# Patient Record
Sex: Male | Born: 1935 | Race: Black or African American | Hispanic: No | Marital: Single | Smoking: Never smoker
Health system: Southern US, Community
[De-identification: ages and names within clinical notes are randomized; demographics above are authoritative.]

## PROBLEM LIST (undated history)

## (undated) DIAGNOSIS — E039 Hypothyroidism, unspecified: Secondary | ICD-10-CM

## (undated) DIAGNOSIS — Z8673 Personal history of transient ischemic attack (TIA), and cerebral infarction without residual deficits: Secondary | ICD-10-CM

## (undated) DIAGNOSIS — N390 Urinary tract infection, site not specified: Secondary | ICD-10-CM

## (undated) DIAGNOSIS — G9341 Metabolic encephalopathy: Secondary | ICD-10-CM

## (undated) DIAGNOSIS — K219 Gastro-esophageal reflux disease without esophagitis: Secondary | ICD-10-CM

## (undated) DIAGNOSIS — I10 Essential (primary) hypertension: Secondary | ICD-10-CM

## (undated) DIAGNOSIS — I639 Cerebral infarction, unspecified: Secondary | ICD-10-CM

## (undated) DIAGNOSIS — F0390 Unspecified dementia without behavioral disturbance: Secondary | ICD-10-CM

## (undated) DIAGNOSIS — M1990 Unspecified osteoarthritis, unspecified site: Secondary | ICD-10-CM

## (undated) HISTORY — PX: CATARACT EXTRACTION: SUR2

## (undated) HISTORY — DX: Personal history of transient ischemic attack (TIA), and cerebral infarction without residual deficits: Z86.73

## (undated) HISTORY — DX: Metabolic encephalopathy: G93.41

---

## 1998-02-08 ENCOUNTER — Ambulatory Visit (HOSPITAL_COMMUNITY): Admission: RE | Admit: 1998-02-08 | Discharge: 1998-02-08 | Payer: Self-pay | Admitting: Orthopedic Surgery

## 1999-04-13 ENCOUNTER — Encounter: Payer: Self-pay | Admitting: Pulmonary Disease

## 1999-04-13 ENCOUNTER — Ambulatory Visit (HOSPITAL_COMMUNITY): Admission: RE | Admit: 1999-04-13 | Discharge: 1999-04-13 | Payer: Self-pay | Admitting: Pulmonary Disease

## 2000-07-14 ENCOUNTER — Ambulatory Visit (HOSPITAL_COMMUNITY): Admission: RE | Admit: 2000-07-14 | Discharge: 2000-07-14 | Payer: Self-pay | Admitting: Orthopedic Surgery

## 2002-12-18 ENCOUNTER — Encounter: Payer: Self-pay | Admitting: Emergency Medicine

## 2002-12-18 ENCOUNTER — Inpatient Hospital Stay (HOSPITAL_COMMUNITY): Admission: EM | Admit: 2002-12-18 | Discharge: 2002-12-27 | Payer: Self-pay | Admitting: *Deleted

## 2002-12-21 ENCOUNTER — Encounter: Payer: Self-pay | Admitting: Pulmonary Disease

## 2002-12-24 ENCOUNTER — Encounter: Payer: Self-pay | Admitting: Neurology

## 2002-12-25 ENCOUNTER — Encounter: Payer: Self-pay | Admitting: Pulmonary Disease

## 2002-12-27 ENCOUNTER — Encounter: Payer: Self-pay | Admitting: Cardiology

## 2003-01-12 ENCOUNTER — Encounter: Admission: RE | Admit: 2003-01-12 | Discharge: 2003-01-12 | Payer: Self-pay | Admitting: Pulmonary Disease

## 2003-01-12 ENCOUNTER — Encounter: Payer: Self-pay | Admitting: Radiology

## 2003-01-12 ENCOUNTER — Encounter: Payer: Self-pay | Admitting: Pulmonary Disease

## 2003-01-16 ENCOUNTER — Encounter: Admission: RE | Admit: 2003-01-16 | Discharge: 2003-03-15 | Payer: Self-pay | Admitting: Pulmonary Disease

## 2003-01-25 ENCOUNTER — Encounter: Payer: Self-pay | Admitting: Pulmonary Disease

## 2003-01-25 ENCOUNTER — Encounter: Admission: RE | Admit: 2003-01-25 | Discharge: 2003-01-25 | Payer: Self-pay | Admitting: Pulmonary Disease

## 2003-02-08 ENCOUNTER — Encounter: Admission: RE | Admit: 2003-02-08 | Discharge: 2003-02-08 | Payer: Self-pay | Admitting: Pulmonary Disease

## 2003-02-08 ENCOUNTER — Encounter: Payer: Self-pay | Admitting: Pulmonary Disease

## 2003-07-21 ENCOUNTER — Emergency Department (HOSPITAL_COMMUNITY): Admission: EM | Admit: 2003-07-21 | Discharge: 2003-07-21 | Payer: Self-pay | Admitting: Emergency Medicine

## 2003-07-24 ENCOUNTER — Inpatient Hospital Stay (HOSPITAL_COMMUNITY): Admission: EM | Admit: 2003-07-24 | Discharge: 2003-07-26 | Payer: Self-pay | Admitting: Emergency Medicine

## 2003-08-08 ENCOUNTER — Encounter: Admission: RE | Admit: 2003-08-08 | Discharge: 2003-08-08 | Payer: Self-pay | Admitting: Internal Medicine

## 2003-10-31 ENCOUNTER — Encounter: Admission: RE | Admit: 2003-10-31 | Discharge: 2003-10-31 | Payer: Self-pay | Admitting: Internal Medicine

## 2004-03-31 ENCOUNTER — Encounter: Admission: RE | Admit: 2004-03-31 | Discharge: 2004-05-21 | Payer: Self-pay | Admitting: Internal Medicine

## 2004-05-09 ENCOUNTER — Ambulatory Visit (HOSPITAL_COMMUNITY): Admission: RE | Admit: 2004-05-09 | Discharge: 2004-05-09 | Payer: Self-pay | Admitting: Internal Medicine

## 2008-11-09 ENCOUNTER — Inpatient Hospital Stay (HOSPITAL_COMMUNITY): Admission: EM | Admit: 2008-11-09 | Discharge: 2008-11-19 | Payer: Self-pay | Admitting: Emergency Medicine

## 2008-11-09 ENCOUNTER — Ambulatory Visit: Payer: Self-pay | Admitting: Internal Medicine

## 2008-11-12 ENCOUNTER — Ambulatory Visit: Payer: Self-pay | Admitting: Infectious Diseases

## 2008-11-15 ENCOUNTER — Encounter: Payer: Self-pay | Admitting: Internal Medicine

## 2010-12-11 LAB — DIFFERENTIAL
Basophils Absolute: 0 10*3/uL (ref 0.0–0.1)
Basophils Absolute: 0.1 10*3/uL (ref 0.0–0.1)
Basophils Relative: 0 % (ref 0–1)
Eosinophils Absolute: 0 10*3/uL (ref 0.0–0.7)
Eosinophils Relative: 0 % (ref 0–5)
Eosinophils Relative: 1 % (ref 0–5)
Lymphocytes Relative: 15 % (ref 12–46)
Lymphs Abs: 1.5 10*3/uL (ref 0.7–4.0)
Monocytes Absolute: 1.4 10*3/uL — ABNORMAL HIGH (ref 0.1–1.0)
Neutro Abs: 7.5 10*3/uL (ref 1.7–7.7)
Neutrophils Relative %: 76 % (ref 43–77)

## 2010-12-11 LAB — CBC
HCT: 36.6 % — ABNORMAL LOW (ref 39.0–52.0)
Hemoglobin: 11.8 g/dL — ABNORMAL LOW (ref 13.0–17.0)
MCV: 91.1 fL (ref 78.0–100.0)
Platelets: 141 10*3/uL — ABNORMAL LOW (ref 150–400)
Platelets: 143 10*3/uL — ABNORMAL LOW (ref 150–400)
Platelets: 152 10*3/uL (ref 150–400)
RBC: 3.8 MIL/uL — ABNORMAL LOW (ref 4.22–5.81)
RBC: 3.86 MIL/uL — ABNORMAL LOW (ref 4.22–5.81)
RBC: 4.3 MIL/uL (ref 4.22–5.81)
RDW: 13.2 % (ref 11.5–15.5)
RDW: 13.7 % (ref 11.5–15.5)
WBC: 13.5 10*3/uL — ABNORMAL HIGH (ref 4.0–10.5)
WBC: 19.2 10*3/uL — ABNORMAL HIGH (ref 4.0–10.5)
WBC: 9.8 10*3/uL (ref 4.0–10.5)

## 2010-12-11 LAB — BASIC METABOLIC PANEL
BUN: 13 mg/dL (ref 6–23)
BUN: 14 mg/dL (ref 6–23)
CO2: 25 mEq/L (ref 19–32)
Calcium: 8.4 mg/dL (ref 8.4–10.5)
Chloride: 103 mEq/L (ref 96–112)
Creatinine, Ser: 1.3 mg/dL (ref 0.4–1.5)
GFR calc non Af Amer: 51 mL/min — ABNORMAL LOW (ref >60)
GFR calc non Af Amer: 52 mL/min — ABNORMAL LOW (ref >60)
Glucose, Bld: 144 mg/dL — ABNORMAL HIGH (ref 70–99)
Glucose, Bld: 145 mg/dL — ABNORMAL HIGH (ref 70–99)
Potassium: 3.6 mEq/L (ref 3.5–5.1)
Potassium: 3.8 mEq/L (ref 3.5–5.1)

## 2010-12-11 LAB — URINALYSIS, ROUTINE W REFLEX MICROSCOPIC
Bilirubin Urine: NEGATIVE
Glucose, UA: NEGATIVE mg/dL
Ketones, ur: NEGATIVE mg/dL
Leukocytes, UA: NEGATIVE
Nitrite: NEGATIVE
Protein, ur: 100 mg/dL — AB
Specific Gravity, Urine: 1.018 (ref 1.005–1.030)
Urobilinogen, UA: 1 mg/dL (ref 0.0–1.0)
pH: 6 (ref 5.0–8.0)

## 2010-12-11 LAB — CULTURE, BLOOD (ROUTINE X 2)
Culture: NO GROWTH
Culture: NO GROWTH

## 2010-12-11 LAB — PHOSPHORUS: Phosphorus: 1.3 mg/dL — ABNORMAL LOW (ref 2.3–4.6)

## 2010-12-11 LAB — URINE MICROSCOPIC-ADD ON

## 2010-12-11 LAB — URINE CULTURE: Colony Count: 85000

## 2010-12-11 LAB — COMPREHENSIVE METABOLIC PANEL
ALT: 18 U/L (ref 0–53)
ALT: 22 U/L (ref 0–53)
AST: 23 U/L (ref 0–37)
AST: 45 U/L — ABNORMAL HIGH (ref 0–37)
Albumin: 2.6 g/dL — ABNORMAL LOW (ref 3.5–5.2)
Albumin: 3.7 g/dL (ref 3.5–5.2)
Alkaline Phosphatase: 58 U/L (ref 39–117)
Alkaline Phosphatase: 63 U/L (ref 39–117)
CO2: 23 mEq/L (ref 19–32)
CO2: 24 mEq/L (ref 19–32)
Chloride: 103 mEq/L (ref 96–112)
Chloride: 106 mEq/L (ref 96–112)
Creatinine, Ser: 1.23 mg/dL (ref 0.4–1.5)
GFR calc Af Amer: 58 mL/min — ABNORMAL LOW (ref >60)
GFR calc Af Amer: >60 mL/min (ref >60)
GFR calc non Af Amer: 48 mL/min — ABNORMAL LOW (ref >60)
GFR calc non Af Amer: 56 mL/min — ABNORMAL LOW (ref >60)
Potassium: 3.5 mEq/L (ref 3.5–5.1)
Potassium: 3.6 mEq/L (ref 3.5–5.1)
Total Bilirubin: 0.7 mg/dL (ref 0.3–1.2)
Total Bilirubin: 0.9 mg/dL (ref 0.3–1.2)

## 2010-12-11 LAB — TSH: TSH: 1.61 u[IU]/mL (ref 0.350–4.500)

## 2010-12-11 LAB — MAGNESIUM: Magnesium: 2.8 mg/dL — ABNORMAL HIGH (ref 1.5–2.5)

## 2011-01-13 NOTE — H&P (Signed)
[name], [name]                ACCOUNT NO.:  [id number]   MEDICAL RECORD NO.:  [id number]          PATIENT TYPE:  EMS   LOCATION:  ED                           FACILITY:  [facility]   PHYSICIAN:  Peggye Pitt, M.D. DATE OF BIRTH:  [birth year]   DATE OF ADMISSION:  11/09/2008  DATE OF DISCHARGE:                              HISTORY & PHYSICAL   PRIMARY CARE PHYSICIAN:  Dr. Andi Devon.   CHIEF COMPLAINT:  Back pain after a fall.   HISTORY OF PRESENT ILLNESS:  [name] is a very pleasant 77 year old  African American man who was in his usual state of health until  Wednesday, which was 2 days prior to admission, when he fell out of bed.  He denies any loss of consciousness and says that he tripped on the rug  that is next to his bed.  Ever since then he has had progressive  increase in lower back pain and right side greater than left leg pain.  His daughters Jamesetta So and Magda Paganini became very concerned today when he  could not even get out of bed and they brought him into the emergency  department for further evaluation and management.  While here he is  found to have a temperature of 101.3.  Because of the above reasons the  hospitalist service is called to admit him for further evaluation and  management.   ALLERGIES:  He has no known drug allergies.   PAST MEDICAL HISTORY:  Significant for:  1. Hypothyroidism.  2. GERD.  3. BPH.  4. Hypertension.  5. Constipation.   HOME MEDICATIONS:  1. Avodart 0.5 mg daily.  2. Nexium 40 mg daily.  3. Synthroid 100 mcg daily.  4. Norvasc 5 mg daily.  5. Benadryl 25 mg as needed.  6. Bisacodyl 5 mg as needed.  7. Tylenol PM at bedtime as needed for insomnia.  8. Aspirin 81 mg daily.  9. Metoprolol 50 mg 1-1/2 tablets daily.  10.Docusate 100 mg as needed for constipation.   SOCIAL HISTORY:  No alcohol, tobacco or illicit drug use.  Lives at home  with his wife.  Has very good family support.  Two of his daughters are  present  during this interview here in the emergency room.   FAMILY HISTORY:  Noncontributory in this elderly gentleman.   REVIEW OF SYSTEMS:  Negative except as already mentioned in HPI.   PHYSICAL EXAMINATION:  VITAL SIGNS: Upon admission blood pressure  150/82, heart rate 93, respirations 20, O2 sat 95% on room air with a  temperature of 101.3.  GENERAL:  He is alert, awake, oriented x3, is in moderate distress  secondary to his lower back pain.  HEENT: He is normocephalic, atraumatic.  His pupils are equally reactive  to light and accommodation.  He has no scleral or conjunctival pallor or  icterus.  NECK:  Supple with no JVD, no lymphadenopathy, no bruits or goiter.  LUNGS:  Clear to auscultation bilaterally.  HEART:  Regular rate and rhythm with no murmurs, rubs or gallops.  ABDOMEN:  Distended with positive bowel sounds.  Slightly tender  to  palpation diffusely.  EXTREMITIES:  He has no edema, positive pedal pulses.  NEUROLOGIC EXAM:  Mental status is intact.  Alert and oriented x3.  His  cranial nerves II through XII are intact although he has not been able  to understand the directions to evaluate his extraocular movements.  His  muscle strength is 4/5 bilateral and symmetric, believed to be effort  dependent.  His DTRs are 2+, symmetric.  He has intact sensation to  light touch bilaterally.  His Babinski is downgoing bilaterally.  He has  intact proprioception.  He has a positive straight leg raise of the  right.  His Kernig and Brudzinski signs are negative.   LABS:  Upon admission, sodium 137, potassium 3.6, chloride 103, bicarb  23, BUN 16, creatinine 1.41 with a glucose of 128.  His AST is slightly  elevated at 45, otherwise LFTs are within normal limits.  WBCs 19.2 with  an ANC of 15.9, hemoglobin of 13.4 and platelet count of 141.  Urinalysis that shows many bacteria.  A chest x-ray that shows a right  basilar atelectasis with cardiomegaly.  A lumbar spine x-ray that shows   no acute bony changes.  A CT scan of his abdomen and pelvis that shows  degenerative joint disease of the lumbar spine with a bilateral inguinal  scrotal canal hernia with no acute complications.   ASSESSMENT AND PLAN:  1. Thoracolumbar back pain.  Given the fact that his pain was after a      fall, I do believe that we need a check an MRI just to make sure he      does not have a herniated disk.  Also, of great concern given his      leukocytosis, fever and his back pain, it is a possibility that he      could have an epidural abscess.  For this reason also check an MRI      of his lumbar spine.  After results of MRI, if these are clear, we      will proceed with physical and occupational therapy consultations.  2. Fever and leukocytosis.  At this point there is no evidence for a      pneumonia.  He may have a slight a urinary tract infection,      although the results are equivocal.  We will start him on Rocephin.      We will check blood cultures and urine cultures.  Will follow.  If      he fails to defervesce, if his white count does not drop or if he      has no improvement in his back pain, he made need a lumbar puncture      just to make sure he does not have meningitis,although at this      point in time he is not confused and does not have any neck      stiffness.  3. Possible urinary tract infection.  He has been started on IV      Rocephin.  4. For his hypothyroidism we will check a TSH and continue his home      dose of Synthroid.  5. For his benign prostatic hypertrophy we will continue him on      Avodart.  6. For his hypertension we will continue his home medications.  He has      slightly elevated systolic blood pressure.  I suspect this may be      related  to pain.  7. For his constipation we will place him on MiraLax as well as      Senokot and p.r.n. Dulcolax suppositories and      Fleet's enema.  8. For prophylaxis while the hospital place on Protonix for       gastrointestinal prophylaxis and Lovenox for deep vein thrombosis      prophylaxis.      Peggye Pitt, M.D.  Electronically Signed     EH/MEDQ  D:  11/09/2008  T:  11/09/2008  Job:  [id number]   cc:   Merlene Laughter. Renae Gloss, M.D.  Fax: [phone]

## 2011-01-13 NOTE — Discharge Summary (Signed)
Bryce Potter, Bryce Potter                ACCOUNT NO.:  [id number]   MEDICAL RECORD NO.:  [id number]          PATIENT TYPE:  INP   LOCATION:  1336                         FACILITY:  [facility]   PHYSICIAN:  Monte Fantasia, MD  DATE OF BIRTH:  [birth year]   DATE OF ADMISSION:  11/09/2008  DATE OF DISCHARGE:                               DISCHARGE SUMMARY   PRIMARY CARE PHYSICIAN:  Dr. Andi Devon.   DISCHARGE DIAGNOSES:  1. Infective endocarditis.  2. Urinary tract infection.  3. Group B streptococcal bacteremia.  4. Degenerative joint disease of the lumbar spine.  5. Hypothyroidism.  6. Benign prostatic hypertrophy.  7. Hypertension.  8. Constipation.   MEDICATIONS:  Upon discharge:  1. Avodart 0.5 mg p.o. daily.  2. Nexium 40 mg p.o. daily.  3. Synthroid 100 mcg p.o. daily.  4. Norvasc 5 mg p.o. daily.  5. Aspirin 81 mg p.o. daily.  6. Metoprolol 50 mg p.o. b.i.d.  7. Ceftriaxone 2 g IV daily for 20 more days, last dose on December 06, 2008, to complete a course of 28 days.  8. Metoprolol 50 mg p.o. b.i.d.  9. MiraLax 17 g p.o. daily.   COURSE DURING THE HOSPITAL STAY:  Bryce Potter is an 77 year old African  American male patient who was admitted on March 12 status post fall with  complaints of increasing lower back pain.  On admission, the patient was  found to have fever with leukocytosis, and blood cultures and urine  cultures were sent.  The patient was found to have UA positive, and the  urine cultures grew E coli which was sensitive to ampicillin,  cephazolin, ceftriaxone, ciprofloxacin, gentamicin, levofloxacin,  nitrofurantoin, tobramycin and Bactrim.  Also, blood cultures done on  admission grew group B Streptococcus agalactiae which was sensitive to  ceftriaxone.  The patient was started on IV antibiotics for the group B  streptococcal bacteremia.  ID evaluation was also asked for in view of  the same.  The patient underwent a TEE to rule out any vegetations.  The  TEE done on April 18 showed a small bright density on the posterior  leaflet suspicious for vegetation with mild calcification on the mitral  valve.  As per the ID recommendations, the patient would need 28 days of  IV antibiotics on ceftriaxone.  The patient at present is awaiting to  have a PICC line placed and can be discharged on IV antibiotics to be  arranged with home care.  Also, the patient had repeat blood cultures  after 48 hours which have been no growth to date.   Regarding the back pain which the patient was admitted with, the patient  had an MRI of the thoracic and lumbar spine which showed degenerative  facet disease and degenerative disk disease without any pronounced  stenosis.  The patient was given pain control medications for the  degenerative joint disease.   Urinary tract infection was treated with ceftriaxone as E coli sensitive  to ceftriaxone.   Regarding hypertension, blood pressure was controlled with hypertensive  medications, and metoprolol was increased  to 50 mg p.o. b.i.d.   RADIOLOGICAL INVESTIGATIONS:  Done during the stay in the hospital,  chest x-ray done on November 09, 2008; impression:  Bibasilar atelectasis,  cardiomegaly without pulmonary edema.  X-ray of the lumbar spine done on  November 09, 2008:  No bony pathology, degenerative joint disease.  CT scan  of the abdomen and pelvis without contrast done on November 09, 2008;  impression:  No nephrolithiasis, no hydronephrosis, bilateral renal  cysts, mild stranding of the mesentery fat surrounding the root of the  mesentery, mild congested vessels seen in this region, no abscess or  fluid collection, degenerative changes in the lumbar spine.  MRI of the  thoracic and lumbar spine - MRI of the thoracic spine - impression:  Negative evaluation for thoracic spine; no evidence of fracture,  infection or compressive pathology seen.  MRI of the lumbar spine:  No  acute findings in the lumbar spine,  degenerative facet disease and  degenerative disk disease without pronounced stenosis, mild edema within  the paraspinal musculature throughout the lumbar junction.  Could relate  to muscular strain.  Abdominal x-ray done on November 10, 2008:  Bowel-gas  pattern consistent with early mild ileus.  Abdominal x-ray done on November 11, 2008:  Gas in the colon, and small bowel does not contain any  appreciable amount of gas.   Labs done during the stay in the hospital:  Total WBC 9.8, hemoglobin  12.7, hematocrit 36.6, platelets 152.  Sodium 140, potassium 3.8,  chloride 110, bicarb 24, glucose 144, BUN 14, creatinine 1.3, total  bilirubin 0.7, alkaline phosphatase 63, AST 23, ALT 18, total protein  7.1, albumin 2.6, calcium 8.5, phosphorus 1.3 and magnesium of 2.8.  Blood cultures done on November 14, 2008, have been no growth to date, and  blood cultures done on November 09, 2008:  One of the two cultures grew  group B Streptococcus; Streptococcus agalactiae isolated; sensitive to  ampicillin, penicillin, clindamycin, erythromycin, vancomycin and  levofloxacin.  Of note, blood cultures done on November 14, 2008, have been  no growth to date.   DISPOSITION:  The patient is medically stable to be discharged.  The  patient is recommended to have home physical therapy as per the  recommendations of the physical therapy evaluation.  We will have a  repeat physical therapy evaluation in view of the same.  The patient is  planned to have a PICC line today and will plan to discharge the patient  as per the PT recommendations, either to home or short-term skilled  nursing facility for rehab.   PHYSICAL FINDINGS:  On examination, vitals:  Temperature of 99.4, pulse  of 80, respirations 18, blood pressure of 138/92.  HEENT EXAMINATION:  Neck is supple.  Pupils equal and reacting to light.  No pallor, no lymphadenopathy.  RESPIRATORY EXAMINATION:  Air entry is bilaterally equal.  No rales, no  rhonchi.   CARDIOVASCULAR EXAMINATION:  S1-S2, regular rate and rhythm.  ABDOMEN:  Is soft.  No distention.  No guarding.  No rigidity.  No  tenderness.  EXTREMITIES:  No edema of feet.      Monte Fantasia, MD  Electronically Signed    MP/MEDQ  D:  11/16/2008  T:  11/16/2008  Job:  [id number]   cc:   Merlene Laughter. Renae Gloss, M.D.  Fax: [phone]

## 2011-01-13 NOTE — Discharge Summary (Signed)
NAMEGUSTAV, Bryce Potter                ACCOUNT NO.:  [id number]   MEDICAL RECORD NO.:  [id number]          PATIENT TYPE:  INP   LOCATION:  1336                         FACILITY:  [facility]   PHYSICIAN:  Monte Fantasia, MD  DATE OF BIRTH:  Jan 14, 1919   DATE OF ADMISSION:  11/09/2008  DATE OF DISCHARGE:                               DISCHARGE SUMMARY   ADDENDUM   PRIMARY CARE PHYSICIAN:  Dr. Andi Devon.   DISCHARGE DIAGNOSES:  1. Infective endocarditis.  2. Urinary tract infection.  3. Group B Streptococcal bacteremia.  4. Degenerative joint disease of lumbar spine.  5. Hypothyroidism.  6. Benign prostatic hypertrophy.  7. Hypertension.  8. Constipation.   MEDICATIONS UPON DISCHARGE:  1. Avodart 0.5 mg p.o. daily.  2. Nexium 40 mg p.o. daily.  3. Synthroid 100 mcg p.o. daily.  4. Norvasc 5 mg p.o. daily.  5. Aspirin 81 mg p.o. daily.  6. Metoprolol 50 mg p.o. b.i.d.  7. Ceftriaxone 2 grams IV daily, the last dose on December 06, 2008,  to      complete course of 28 days.  8. Metoprolol 50 mg p.o. b.i.d.  9. MiraLax 17 grams p.o. daily.   COURSE DURING THE HOSPITAL STAY:  Please refer to the prior Discharge  Summary dictation for the course during the hospital stay dictated on  November 06, 2008. This is an addendum to the same.  There has been no  change in the course.  The patient at present is awaiting placement in  the nursing home facility for rehab for his degenerative joint disease  with lumbar pain.  The patient has had no complaints in the last 2-3  days and at present is medically stable to be discharged home.   PHYSICAL EXAMINATION:  VITALS:  Temperature of 97.8, heart rate 88,  respirations 20, blood pressure 140/75.  HEENT EXAMINATION:  Neck is supple.  Pupils equal, reacting to light.  No pallor, no lymphadenopathy.  RESPIRATORY EXAMINATION:  Air entry is bilaterally equal.  No rales, no  rhonchi.  CARDIOVASCULAR EXAMINATION: S1 and S2, regular rate and rhythm.  ABDOMEN:  Soft.  No guarding or rigidity.  No tenderness.  EXTREMITIES: No edema in the feet.  PICC line in the right arm.   DISPOSITION:  The patient at present is medically stable to be  discharged to rehab facility, will discharge the patient today.      Monte Fantasia, MD  Electronically Signed     MP/MEDQ  D:  11/19/2008  T:  11/19/2008  Job:  [id number]

## 2011-01-16 NOTE — Discharge Summary (Signed)
NAMECASEN, Bryce Potter                            ACCOUNT NO.:  [id number]   MEDICAL RECORD NO.:  [id number]                   PATIENT TYPE:  INP   LOCATION:  2041                                 FACILITY:  [facility]   PHYSICIAN:  Leonia Reeves, MD                 DATE OF BIRTH:  1919/07/17   DATE OF ADMISSION:  07/24/2003  DATE OF DISCHARGE:  07/26/2003                                 DISCHARGE SUMMARY   DISCHARGE DIAGNOSES:  1. Right lower lobe pneumonia, probably secondary to aspiration.  2. Dehydration.  3. General weakness and deconditioning.  4. Leg pain, probably secondary to degenerative joint disease.   DISCHARGE MEDICATIONS:  1. Levaquin 500 mg once daily for 10 more days.  2. Robitussin 15 ml q.6h. as needed for cough.   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  The patient presents to the emergency room with cough, sputum  production, and general weakness.  The patient had been seen earlier in the  ER a few days prior to this presentation when he came with cough, and was  diagnosed with early right lower lobe pneumonia on chest x-ray.  He was  given p.o. Levaquin and was discharged home.  The problem continued, and the  patient also developed lower extremity pains.  He denied chest pain,  shortness of breath, and also denied nausea or vomiting.   PHYSICAL EXAMINATION:  VITAL SIGNS:  Temperature 98.4, blood pressure  102/53, pulse 125, which was sinus, respiratory rate 22.  His initial  saturation was 82% on room air, which improved to 96% after oxygen  treatment.  GENERAL:  He was well-developed, well-nourished, and in no acute distress.  CHEST:  Significant for decreased breath sounds at the bases with fine  crackles, left greater than right, but no obvious rhonchi.  EXTREMITIES:   Global weakness over the left leg.  It was much weaker than  the right.  There was no edema, no deformity.  NEUROLOGIC:  Speech was clear and fluent.  Mild left facial weakness.   The rest of the physical exam was unremarkable.   LABORATORY DATA:  White count 6, hemoglobin 12.8, hematocrit 32.8, platelets  151.  CT of the head done in the previous presentation in the ER revealed no  acute event.  Chest x-ray showed no acute disease.  There was no progression  of the early right lower lobe finding on the previous x-ray.  Foot x-ray  showed no acute disease or fracture.   HOSPITAL COURSE:  The patient was admitted to the medical floor and started  on IV rehydration with normal saline, and p.o. antibiotics with Levaquin.  He was also started on his home medications, but antihypertensive was held  for low blood  pressure.  The patient also benefited from physical therapy,  in which he did well for improvement of strength.  He responded to the above  regimen and continued to do well.  General weakness improved to the  patient's baseline.   DISPOSITION:  The patient is being discharged home today in stable  condition.  He has bee instructed to follow up with his primary care  physician in 7-14 days following discharge.  He has also been instructed to  continue his home medication, and review with the primary care physician.  The rationale for this discharge was discussed with the patient and his two  daughters, who were at the bedside during this discussion, and they are  agreeable.                                                Leonia Reeves, MD    VO/MEDQ  D:  07/26/2003  T:  07/26/2003  Job:  [id number]   cc:   Olene Craven, M.D.  [address]  [address]  [place]  [place] [address]  Fax: [phone]

## 2011-01-16 NOTE — Consult Note (Signed)
Bryce Potter, Bryce Potter                            ACCOUNT NO.:  [id number]   MEDICAL RECORD NO.:  [id number]                   PATIENT TYPE:  INP   LOCATION:  3022                                 FACILITY:  [facility]   PHYSICIAN:  Marlan Palau, M.D.               DATE OF BIRTH:  12/08/1919   DATE OF CONSULTATION:  12/26/2002  DATE OF DISCHARGE:                                   CONSULTATION   HISTORY OF PRESENT ILLNESS:  The patient is an 77 year old right-handed  black male born December 08, 1919 with a history of mild hypertension.  The  patient presented to [facility] with mild right-sided  weakness, dysarthria on the day of admission.  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  The patient was noted to be slightly  staggering.  The patient denied any headache or visual field changes.  The  patient denies any numbness on one side but claims his feet had been numb  for five to six years.   The patient underwent a CT scan of the head initially that was unremarkable,  but a MRI scan of the brain showed a small left central semiovale acute  stroke.  Mild intracranial atherosclerotic changes were seen.  The patient  has remained on heparin.  Neurology has asked to see this patient for  further evaluation.  The patient claims he was not on aspirin prior to  admission.   PAST MEDICAL HISTORY:  1. Significant for history of mild right hemiparesis on this admission with     a left central semiovale stroke.  2. Hypertension.  3. Bilateral cataract surgery.  4. Left foot surgery.  5. Hypothyroidism.  6. Degenerative arthritis, low back pain.   ALLERGIES:  The patient has no known allergies.   HABITS:  Does not smoke or drink.   CURRENT MEDICATIONS:  1. Lopressor 25 mg b.i.d.  2. Zocor for hypercholesterolemia 20 mg q.h.s.  3. __________ 100 mg b.i.d.  4. Synthroid 0.1 mg  q.d.  5. The patient is on aspirin 325 mg q.d.  6. Avapro 150 mg q.d.  7. Hydrochlorothiazide 12.5 mg q.d.  8. Avalox q.d.  9. IV heparin.   SOCIAL HISTORY:  The patient lives in the [place] area.  Married.  Has  four children, he claims are alive and well.  Does some part-time work for  the [employer].   FAMILY HISTORY:  Notable that both parents have passed away from old age.  The patient has one sister-apparently three sisters have passed away, one to  cancer and one to old age.   REVIEW OF SYMPTOMS:  Notable for no recent fevers or chills.  The patient  does note some neck pains and low back pains.  Denies shortness  of breath,  chest pains, palpitations, nausea, vomiting.  Has not had any trouble  controlling the bowels or bladder.  Denies any loss of consciousness.   PHYSICAL EXAMINATION:  VITAL SIGNS:  Blood pressure is 106/57, heart rate is  109, respiratory rate 20, temperature 100.2.  GENERAL:  The patient is a fairly well-developed black male who is alert and  cooperative at the time of examination.  HEENT:  Head:  Atraumatic.  Eyes:  Pupils are equal, round, and reactive to  light.  Pupils are postsurgical.  Disk are flat bilaterally.  NECK:  Supple.  No carotid bruits noted.  LUNGS:  Clear.  CARDIOVASCULAR:  Regular rate and rhythm with no obvious murmurs or rubs  noted.  EXTREMITIES:  Without significant edema.  NEUROLOGICAL:  Cranial nerves as above.  Facial symmetry is present.  The  patient has good sensation to face to pinprick and soft touch bilaterally.  Speech was fairly well enunciated, maybe a slight amount of dysarthria is  noted.  Again, visual fields are full.  Extraocular movements are full.  The  patient has good strength on all four extremities to direct testing.  The  patient has good finger-to-nose and heel-to-shin bilaterally.  Deep tendon  reflexes are relatively symmetric throughout.  There is are upgoing toes  noted  bilaterally.  The patient again has good pinprick, soft, and vibratory  sensation throughout.  Gait is unremarkable.  The patient is able to walk  without assistance.  The patient is able to perform tandem gait with minimal  instability.  Romberg is negative.  No evidence of pronator drift was seen.   LABORATORY DATA:  Laboratory values are notable for a white count of 7.7,  hemoglobin 11.8, hematocrit of 34.1, platelets of 207,000.  INR of 1.0,  sodium is 139, potassium 3.6, chloride of 107, CO2 26, glucose of 108, BUN  18, creatinine 1.7, calcium 8.2, total protein 6.6, albumin 3.4, AST of 19,  ALT 29, ALP of 53, total bilirubin of 0.5.  CK of 77, MB fraction of 2.2,  troponin I of 0.02.  Cholesterol reveals total cholesterol level of 200, HDL  of 73,m VLDL of 17, LDL of 110.  Urinalysis reveals specific gravity of  1.014, pH of 6.5, large amount of hemoglobin, 0 to 2 white cells, red cells  too numerous to count.   Chest x-ray reveals mild cardiac enlargement, no active disease is seen.  EKG reveals sinus tachycardia with occasional premature supraventricular  complexes, nonspecific T-wave abnormalities, heart rate of 110.   IMPRESSION:  1. New onset of mild right hemiparesis, mild dysarthria, left central     semiovale stroke.  2. Hypertension.   RECOMMENDATIONS:  This patient has very minimal symptoms at this point.  The  patient will likely do well as an outpatient.  The patient claims he is not  on aspirin prior to coming in but currently is on aspirin at this point as  well as heparin.  We will complete the full workup at this point.  The  patient has mild intracranial atherosclerotic changes performed with carotid  doppler study and 2-D echocardiogram.  If his is unremarkable, the patient  may be discharged to home on antiplatelet agents.    PLAN:  1. Carotid doppler.  2. A 2-D echocardiogram. 3. We will follow the patient's clinical course while in house.  Marlan Palau, M.D.    CKW/MEDQ  D:  12/26/2002  T:  12/26/2002  Job:  [id number]   cc:   Eino Farber., M.D.  [address] [place]  [place] [address]  Fax: [phone]   [facility]  [address]

## 2011-01-16 NOTE — Discharge Summary (Signed)
Bryce Potter, Bryce Potter                            ACCOUNT NO.:  [id number]   MEDICAL RECORD NO.:  [id number]                   PATIENT TYPE:  INP   LOCATION:  3022                                 FACILITY:  [facility]   PHYSICIAN:  Eino Farber., M.D.      DATE OF BIRTH:  12/08/1919   DATE OF ADMISSION:  12/18/2002  DATE OF DISCHARGE:  12/27/2002                                 DISCHARGE SUMMARY   DISCHARGE DIAGNOSES:  1. Acute infarction cerebrovascular accident with mild right hemiparesis,     slowly improving.  2. Hypertensive heart disease history.  3. Hypercholesterolemia, mild and being treated.  4. Hypothyroidism, being treated with Synthroid.  5. Lumbar spine disk disease with moderate to severe pain, being treated and     planned for steroid spine injections.  6. Degenerative joint disease of the extremities.  7. Bacteremia with fever, resolved with antibiotics for cocci.   BRIEF HISTORY:  The patient is an 77 year old African-American male with a  complaint of feeling well today until he was seen by his grandson, who  noticed that he was weak on the right side with some twisting of his face,  and he was brought to the [facility] emergency department  for further evaluation.  The patient has a long history of mild hypertensive  heart disease.  He also has a history of degenerative joint disease of his  extremities and back in the lumbar spine area.   PAST HISTORY:  The patient has a past history of hypertensive heart disease  as noted, and takes Diovan 160/hydrochlorothiazide 12.5 mg daily.   SOCIAL HISTORY:  The patient is married and lives with his wife.  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   PHYSICAL EXAMINATION:  Pertinent physical findings on 12/18/02 revealed the  following -  VITAL SIGNS:  Temperature 99, pulse 102, respirations 20, and blood pressure  of 160/91 to improvement to the 146/70  range.  GENERAL:  The patient is alert and talking coherently.  HEENT:  Eyes - pupils equal, round and reactive to light.  The patient does  have a mild right facial weakness with minimal slurred speech.  NECK:  Supple with no bruits.  HEART:  Regular rhythm with no rubs.  ABDOMEN:  Moderately obese and moderately distended, but nontender.  The  patient relates not having a bowel movement on the day of admission, which  was an unusual occurrence for him.  EXTREMITIES:  There is minimal weakness of the right leg and no edema.   LABORATORY DATA:  CBC on 12/18/02 revealed a hemoglobin of 14.5, hematocrit  43.6, platelet count 165,000.  The patient's CBC remained unusual and  satisfactory throughout the hospital course.  Laboratory data revealed no  significant change.  There were some occasions of elevated serum potassium  in a hemolyzed specimen in the 6 mEq per liter  range, but repeat in a non-  hemolyzed specimen was 3.7 mEq per liter.  The patient's thyroid panel  revealed that he was getting satisfactory control with his Synthroid.  His  serum cholesterol was also in the borderline range to slightly elevated for  his vascular occlusion problem with a cholesterol of 191 on 12/21/02, with an  LDL cholesterol of 110, with the goal being in the 85 range.  The patient's  CT scan did reveal changes of small ___________ disease, and MRI revealed a  small left central semi ovale acute stroke during his hospital course.  The  patient's 2-D echo did not reveal a wall thrombus.  There was mild  heterogenous plaque origins in the internal carotid artery.  The patient's 2-  D echo revealed a good systolic ejection fraction ranging being in the 65%  range.  No cardiac embolus source was found.  The patient continued to  improve and was discharged on 12/27/02.   DISCHARGE MEDICATIONS:  1. Pletal 100 mg tablet, one tablet q.i.d.  2. Lopressor 25 mg tablet, one tablet twice a day.  3. Diovan HCT  80/12.5 mg tabs one daily.  4. Zocor 20 mg one tablet at bedtime.  5. Synthroid 100 mcg. one tablet daily.  6. Aspirin 325 mg one tablet daily.  The patient has not been taking his     aspirin every day prior to admission.  7. He is to have Tylenol 650 mg q.4h. p.r.n. as needed for joint pains.   DIET:  Low-salt, low-fat, low-cholesterol diet.   [facility] will assist him with outpatient physical therapy and  outpatient therapy.  He is to have follow up in my office, Corine Shelter, M.D., on 01/24/03.                                               Eino Farber., M.D.    GRK/MEDQ  D:  02/01/2003  T:  02/01/2003  Job:  [id number]   cc:   Marlan Palau, M.D.  [address]  [address]  [place]  [place] [address]  Fax: [phone]

## 2011-01-16 NOTE — H&P (Signed)
Bryce Potter, DRUMMOND                            ACCOUNT NO.:  [id number]   MEDICAL RECORD NO.:  [id number]                   PATIENT TYPE:  INP   LOCATION:                                       FACILITY:  [facility]   PHYSICIAN:  Melissa L. Ladona Ridgel, MD               DATE OF BIRTH:  1919-07-26   DATE OF ADMISSION:  07/24/2003  DATE OF DISCHARGE:  07/26/2003                                HISTORY & PHYSICAL   CHIEF COMPLAINT:  Left leg pain, weakness, history of right lower lobe  pneumonia being treated.   HISTORY OF PRESENT ILLNESS:  This is an 77 year old African-American male  who was seen over the weekend in the emergency room for complaints of cough  productive of sputum and generalized weakness. He was diagnosed with  dehydration and early right lobe pneumonia on x-ray. He was deemed suitable  for outpatient therapy with Levaquin and was discharged home. The patient  states that he was okay until later Saturday/early Sunday. Between now and  then, he relates a drastic change. He has been unable to eat or drink. He is  actually unable to walk today. He required assistance to the emergency room  and complained of left leg pain. He denies fevers or chills or nausea or  vomiting and states that cough has improved with therapy.   REVIEW OF SYSTEMS:  His review of systems is as above. He denies any dysuria  or frequency. Denies chest pain, shortness of breath, abdominal pain  __________ yesterday. He denies melena or hematochezia. He does complain of  generalized weakness and ambulatory dysfunction secondary to pain and  weakness.   PAST MEDICAL HISTORY:  Significant for CVA back in April 2004 with a left  central semi-ovale stroke with some residual right sided weakness.  1. Hypertension.  2. Hypothyroidism.  3. Degenerative joint disease.   PAST SURGICAL HISTORY:  Significant for cataracts bilaterally and some left  foot surgery.   ALLERGIES:  No known drug allergies.   MEDICATIONS:  As per his old record his last admission showed:  1. Lopressor 25 mg b.i.d.  2. Zocor 20 mg q.h.s.  3. Synthroid 100 mcg q.d.  4. Aspirin 325 q.d.  5. Diovan hydrochlorothiazide 80 and 125 daily.  6. Pletal 100 mg q.i.d.  7. He is also taking Levaquin as per the emergency room physician since     discharged on Saturday.   The patient is unable to give me his medication list. His daughter is not  available, and his son does not know his medications. Call has been placed  to Dr. Loney Laurence office to view his medical chart.   PHYSICAL EXAMINATION:  VITAL SIGNS:  On admission 98.4, blood pressure  102/53, pulse 125 which is sinus, respiratory rate 22. Initially his  saturation is documented as 82% on room air. He is currently 96% on room  air. Complains of  pain 5/10 of his left lower foot/leg.  GENERAL:  This is a weak, African-American male, well-developed, well-  nourished in no acute distress.  HEENT:  Pupils are equal, round, and reactive to light. Extraocular muscles  are intact. His mucous membranes are dry. He does have a coating on his  tongue.  NECK:  He has no lymph nodes, no bruits. He has no thyromegaly. No JVD. He  does have mild left __________.  CHEST:  Decreased at the bases with fine crackles, left greater than right,  but no obvious rhonchi.  CARDIOVASCULAR:  Tachycardia. Positive S1 and S2. No S3 or S4. No murmurs,  rubs, or gallops are noted.  ABDOMEN:  Soft, nontender, nondistended with positive bowel sounds.  EXTREMITIES:  Show global weakness over the left leg. It is much weaker than  the right. There is no edema. No deformities. The plantar area is tender.  NEUROLOGICAL:  Speech is clear and fluent. Mild left facial. There is some  global weakness. DTRs are 2+. Plantars are unequivocal. He does not appear  to have any focal deficits.   LABORATORY DATA:  At present, CBC shows a white count of 6, hemoglobin of  12.8, hematocrit of 38, platelet  count of 151. His BMET is pending. His  urinalysis is negative. No leukocyte esterase or nitrites are noted. White  count 0 to 2 on his urinalysis. Chest x-ray shows no acute disease. There is  not any progression of the early right lower lobe finding on the previous x-  ray. Foot x-ray shows no acute disease. Head CT done on Saturday reveals no  acute event.   ASSESSMENT/PLAN:  This is an 77 year old African-American male who recently  was treated for early right lower lobe pneumonia with Levaquin.  His cough  is better, but he has developed decreased appetite, weakness, and could not  walk. Also has some left foot and left leg pain, and there is no focal  neurological deficit seen on exam or complained of.   1. Pulmonary. X-ray shows no signs of pneumonia. No increased white count     and no fever on admission. We will, however, continue treatment with     Levaquin, and if his saturations show decrease or worsening, we will     consider imaging his chest for PE.  2. Cardiovascular. His hypertension is currently not an issue as he is     mildly hypotensive. We will hold his Diovan and Lopressor and call and     speak with Dr. Loney Laurence office regarding his true medication list     versus waiting on the family to bring that in.  3. Endocrine. The patient has a history of hypothyroidism. We will check a     TSH and continue his Synthroid at 100 mcg for now.  4. Neurological. He is expressing generalized weakness. There is some left     greater than right decrease in power; however, this does appear to be     subjective. If he does not improve with rehydration and improvement in     diet in terms of symptoms of weakness, we will consider checking MRI/MRA     to rule out new stroke.  5. Foot pain. X-ray shows no evidence of fracture; however, he was recently     placed on a quinolone which could induce Achilles rupture, and I will     further examine the patient along these  lines.  PLAN:  The plan is to admit  the patient to telemetry, rehydrate, and follow  up once he is adequately being fed and rehydrated for further signs and  symptoms suggestive of a neurological event or other explanation for his  generalized weakness.                                                Melissa L. Ladona Ridgel, MD    MLT/MEDQ  D:  07/24/2003  T:  07/24/2003  Job:  [id number]

## 2011-12-01 DIAGNOSIS — K219 Gastro-esophageal reflux disease without esophagitis: Secondary | ICD-10-CM | POA: Diagnosis not present

## 2011-12-01 DIAGNOSIS — N184 Chronic kidney disease, stage 4 (severe): Secondary | ICD-10-CM | POA: Diagnosis not present

## 2011-12-01 DIAGNOSIS — I129 Hypertensive chronic kidney disease with stage 1 through stage 4 chronic kidney disease, or unspecified chronic kidney disease: Secondary | ICD-10-CM | POA: Diagnosis not present

## 2011-12-01 DIAGNOSIS — H6120 Impacted cerumen, unspecified ear: Secondary | ICD-10-CM | POA: Diagnosis not present

## 2011-12-15 DIAGNOSIS — M79609 Pain in unspecified limb: Secondary | ICD-10-CM | POA: Diagnosis not present

## 2011-12-15 DIAGNOSIS — B351 Tinea unguium: Secondary | ICD-10-CM | POA: Diagnosis not present

## 2012-05-25 DIAGNOSIS — H6120 Impacted cerumen, unspecified ear: Secondary | ICD-10-CM | POA: Diagnosis not present

## 2012-05-25 DIAGNOSIS — E039 Hypothyroidism, unspecified: Secondary | ICD-10-CM | POA: Diagnosis not present

## 2012-05-25 DIAGNOSIS — I129 Hypertensive chronic kidney disease with stage 1 through stage 4 chronic kidney disease, or unspecified chronic kidney disease: Secondary | ICD-10-CM | POA: Diagnosis not present

## 2012-05-25 DIAGNOSIS — E559 Vitamin D deficiency, unspecified: Secondary | ICD-10-CM | POA: Diagnosis not present

## 2012-05-25 DIAGNOSIS — E785 Hyperlipidemia, unspecified: Secondary | ICD-10-CM | POA: Diagnosis not present

## 2012-05-25 DIAGNOSIS — I10 Essential (primary) hypertension: Secondary | ICD-10-CM | POA: Diagnosis not present

## 2012-05-25 DIAGNOSIS — N401 Enlarged prostate with lower urinary tract symptoms: Secondary | ICD-10-CM | POA: Diagnosis not present

## 2012-08-15 DIAGNOSIS — Z23 Encounter for immunization: Secondary | ICD-10-CM | POA: Diagnosis not present

## 2012-11-23 DIAGNOSIS — E559 Vitamin D deficiency, unspecified: Secondary | ICD-10-CM | POA: Diagnosis not present

## 2012-11-23 DIAGNOSIS — I129 Hypertensive chronic kidney disease with stage 1 through stage 4 chronic kidney disease, or unspecified chronic kidney disease: Secondary | ICD-10-CM | POA: Diagnosis not present

## 2012-11-23 DIAGNOSIS — R1310 Dysphagia, unspecified: Secondary | ICD-10-CM | POA: Diagnosis not present

## 2012-11-23 DIAGNOSIS — I10 Essential (primary) hypertension: Secondary | ICD-10-CM | POA: Diagnosis not present

## 2012-11-23 DIAGNOSIS — R05 Cough: Secondary | ICD-10-CM | POA: Diagnosis not present

## 2012-11-23 DIAGNOSIS — E039 Hypothyroidism, unspecified: Secondary | ICD-10-CM | POA: Diagnosis not present

## 2012-11-29 DIAGNOSIS — Z8673 Personal history of transient ischemic attack (TIA), and cerebral infarction without residual deficits: Secondary | ICD-10-CM

## 2012-11-29 HISTORY — DX: Personal history of transient ischemic attack (TIA), and cerebral infarction without residual deficits: Z86.73

## 2013-02-21 DIAGNOSIS — R05 Cough: Secondary | ICD-10-CM | POA: Diagnosis not present

## 2013-02-21 DIAGNOSIS — E039 Hypothyroidism, unspecified: Secondary | ICD-10-CM | POA: Diagnosis not present

## 2013-02-21 DIAGNOSIS — M545 Low back pain: Secondary | ICD-10-CM | POA: Diagnosis not present

## 2013-02-21 DIAGNOSIS — E785 Hyperlipidemia, unspecified: Secondary | ICD-10-CM | POA: Diagnosis not present

## 2013-02-21 DIAGNOSIS — I129 Hypertensive chronic kidney disease with stage 1 through stage 4 chronic kidney disease, or unspecified chronic kidney disease: Secondary | ICD-10-CM | POA: Diagnosis not present

## 2013-05-17 DIAGNOSIS — E559 Vitamin D deficiency, unspecified: Secondary | ICD-10-CM | POA: Diagnosis not present

## 2013-05-17 DIAGNOSIS — I129 Hypertensive chronic kidney disease with stage 1 through stage 4 chronic kidney disease, or unspecified chronic kidney disease: Secondary | ICD-10-CM | POA: Diagnosis not present

## 2013-05-17 DIAGNOSIS — E039 Hypothyroidism, unspecified: Secondary | ICD-10-CM | POA: Diagnosis not present

## 2013-05-17 DIAGNOSIS — I6789 Other cerebrovascular disease: Secondary | ICD-10-CM | POA: Diagnosis not present

## 2013-05-17 DIAGNOSIS — Z0000 Encounter for general adult medical examination without abnormal findings: Secondary | ICD-10-CM | POA: Diagnosis not present

## 2013-05-17 DIAGNOSIS — N390 Urinary tract infection, site not specified: Secondary | ICD-10-CM | POA: Diagnosis not present

## 2013-05-17 DIAGNOSIS — R7309 Other abnormal glucose: Secondary | ICD-10-CM | POA: Diagnosis not present

## 2013-05-17 DIAGNOSIS — E785 Hyperlipidemia, unspecified: Secondary | ICD-10-CM | POA: Diagnosis not present

## 2013-05-24 DIAGNOSIS — Z8673 Personal history of transient ischemic attack (TIA), and cerebral infarction without residual deficits: Secondary | ICD-10-CM | POA: Diagnosis not present

## 2013-05-24 DIAGNOSIS — M6281 Muscle weakness (generalized): Secondary | ICD-10-CM | POA: Diagnosis not present

## 2013-05-24 DIAGNOSIS — I129 Hypertensive chronic kidney disease with stage 1 through stage 4 chronic kidney disease, or unspecified chronic kidney disease: Secondary | ICD-10-CM | POA: Diagnosis not present

## 2013-05-24 DIAGNOSIS — F0280 Dementia in other diseases classified elsewhere without behavioral disturbance: Secondary | ICD-10-CM | POA: Diagnosis not present

## 2013-05-24 DIAGNOSIS — R262 Difficulty in walking, not elsewhere classified: Secondary | ICD-10-CM | POA: Diagnosis not present

## 2013-05-24 DIAGNOSIS — M1710 Unilateral primary osteoarthritis, unspecified knee: Secondary | ICD-10-CM | POA: Diagnosis not present

## 2013-05-24 DIAGNOSIS — N189 Chronic kidney disease, unspecified: Secondary | ICD-10-CM | POA: Diagnosis not present

## 2013-05-24 DIAGNOSIS — R32 Unspecified urinary incontinence: Secondary | ICD-10-CM | POA: Diagnosis not present

## 2013-05-24 DIAGNOSIS — IMO0001 Reserved for inherently not codable concepts without codable children: Secondary | ICD-10-CM | POA: Diagnosis not present

## 2013-05-26 DIAGNOSIS — R262 Difficulty in walking, not elsewhere classified: Secondary | ICD-10-CM | POA: Diagnosis not present

## 2013-05-26 DIAGNOSIS — M6281 Muscle weakness (generalized): Secondary | ICD-10-CM | POA: Diagnosis not present

## 2013-05-26 DIAGNOSIS — I129 Hypertensive chronic kidney disease with stage 1 through stage 4 chronic kidney disease, or unspecified chronic kidney disease: Secondary | ICD-10-CM | POA: Diagnosis not present

## 2013-05-26 DIAGNOSIS — IMO0001 Reserved for inherently not codable concepts without codable children: Secondary | ICD-10-CM | POA: Diagnosis not present

## 2013-05-26 DIAGNOSIS — F0280 Dementia in other diseases classified elsewhere without behavioral disturbance: Secondary | ICD-10-CM | POA: Diagnosis not present

## 2013-05-26 DIAGNOSIS — M1710 Unilateral primary osteoarthritis, unspecified knee: Secondary | ICD-10-CM | POA: Diagnosis not present

## 2013-05-30 DIAGNOSIS — M1710 Unilateral primary osteoarthritis, unspecified knee: Secondary | ICD-10-CM | POA: Diagnosis not present

## 2013-05-30 DIAGNOSIS — IMO0001 Reserved for inherently not codable concepts without codable children: Secondary | ICD-10-CM | POA: Diagnosis not present

## 2013-05-30 DIAGNOSIS — M6281 Muscle weakness (generalized): Secondary | ICD-10-CM | POA: Diagnosis not present

## 2013-05-30 DIAGNOSIS — R262 Difficulty in walking, not elsewhere classified: Secondary | ICD-10-CM | POA: Diagnosis not present

## 2013-05-30 DIAGNOSIS — I129 Hypertensive chronic kidney disease with stage 1 through stage 4 chronic kidney disease, or unspecified chronic kidney disease: Secondary | ICD-10-CM | POA: Diagnosis not present

## 2013-05-30 DIAGNOSIS — F0280 Dementia in other diseases classified elsewhere without behavioral disturbance: Secondary | ICD-10-CM | POA: Diagnosis not present

## 2013-05-31 DIAGNOSIS — IMO0001 Reserved for inherently not codable concepts without codable children: Secondary | ICD-10-CM | POA: Diagnosis not present

## 2013-05-31 DIAGNOSIS — M1710 Unilateral primary osteoarthritis, unspecified knee: Secondary | ICD-10-CM | POA: Diagnosis not present

## 2013-05-31 DIAGNOSIS — R262 Difficulty in walking, not elsewhere classified: Secondary | ICD-10-CM | POA: Diagnosis not present

## 2013-05-31 DIAGNOSIS — I129 Hypertensive chronic kidney disease with stage 1 through stage 4 chronic kidney disease, or unspecified chronic kidney disease: Secondary | ICD-10-CM | POA: Diagnosis not present

## 2013-05-31 DIAGNOSIS — F0280 Dementia in other diseases classified elsewhere without behavioral disturbance: Secondary | ICD-10-CM | POA: Diagnosis not present

## 2013-05-31 DIAGNOSIS — M6281 Muscle weakness (generalized): Secondary | ICD-10-CM | POA: Diagnosis not present

## 2013-06-01 DIAGNOSIS — M1710 Unilateral primary osteoarthritis, unspecified knee: Secondary | ICD-10-CM | POA: Diagnosis not present

## 2013-06-01 DIAGNOSIS — R262 Difficulty in walking, not elsewhere classified: Secondary | ICD-10-CM | POA: Diagnosis not present

## 2013-06-01 DIAGNOSIS — F0280 Dementia in other diseases classified elsewhere without behavioral disturbance: Secondary | ICD-10-CM | POA: Diagnosis not present

## 2013-06-01 DIAGNOSIS — I129 Hypertensive chronic kidney disease with stage 1 through stage 4 chronic kidney disease, or unspecified chronic kidney disease: Secondary | ICD-10-CM | POA: Diagnosis not present

## 2013-06-01 DIAGNOSIS — IMO0001 Reserved for inherently not codable concepts without codable children: Secondary | ICD-10-CM | POA: Diagnosis not present

## 2013-06-01 DIAGNOSIS — M6281 Muscle weakness (generalized): Secondary | ICD-10-CM | POA: Diagnosis not present

## 2013-06-05 DIAGNOSIS — R262 Difficulty in walking, not elsewhere classified: Secondary | ICD-10-CM | POA: Diagnosis not present

## 2013-06-05 DIAGNOSIS — M6281 Muscle weakness (generalized): Secondary | ICD-10-CM | POA: Diagnosis not present

## 2013-06-05 DIAGNOSIS — M1710 Unilateral primary osteoarthritis, unspecified knee: Secondary | ICD-10-CM | POA: Diagnosis not present

## 2013-06-05 DIAGNOSIS — F0280 Dementia in other diseases classified elsewhere without behavioral disturbance: Secondary | ICD-10-CM | POA: Diagnosis not present

## 2013-06-05 DIAGNOSIS — I129 Hypertensive chronic kidney disease with stage 1 through stage 4 chronic kidney disease, or unspecified chronic kidney disease: Secondary | ICD-10-CM | POA: Diagnosis not present

## 2013-06-05 DIAGNOSIS — IMO0001 Reserved for inherently not codable concepts without codable children: Secondary | ICD-10-CM | POA: Diagnosis not present

## 2013-06-06 DIAGNOSIS — M1710 Unilateral primary osteoarthritis, unspecified knee: Secondary | ICD-10-CM | POA: Diagnosis not present

## 2013-06-06 DIAGNOSIS — M6281 Muscle weakness (generalized): Secondary | ICD-10-CM | POA: Diagnosis not present

## 2013-06-06 DIAGNOSIS — F0280 Dementia in other diseases classified elsewhere without behavioral disturbance: Secondary | ICD-10-CM | POA: Diagnosis not present

## 2013-06-06 DIAGNOSIS — R262 Difficulty in walking, not elsewhere classified: Secondary | ICD-10-CM | POA: Diagnosis not present

## 2013-06-06 DIAGNOSIS — IMO0001 Reserved for inherently not codable concepts without codable children: Secondary | ICD-10-CM | POA: Diagnosis not present

## 2013-06-06 DIAGNOSIS — I129 Hypertensive chronic kidney disease with stage 1 through stage 4 chronic kidney disease, or unspecified chronic kidney disease: Secondary | ICD-10-CM | POA: Diagnosis not present

## 2013-06-07 DIAGNOSIS — I129 Hypertensive chronic kidney disease with stage 1 through stage 4 chronic kidney disease, or unspecified chronic kidney disease: Secondary | ICD-10-CM | POA: Diagnosis not present

## 2013-06-07 DIAGNOSIS — IMO0001 Reserved for inherently not codable concepts without codable children: Secondary | ICD-10-CM | POA: Diagnosis not present

## 2013-06-07 DIAGNOSIS — F0280 Dementia in other diseases classified elsewhere without behavioral disturbance: Secondary | ICD-10-CM | POA: Diagnosis not present

## 2013-06-07 DIAGNOSIS — M1710 Unilateral primary osteoarthritis, unspecified knee: Secondary | ICD-10-CM | POA: Diagnosis not present

## 2013-06-07 DIAGNOSIS — R262 Difficulty in walking, not elsewhere classified: Secondary | ICD-10-CM | POA: Diagnosis not present

## 2013-06-07 DIAGNOSIS — M6281 Muscle weakness (generalized): Secondary | ICD-10-CM | POA: Diagnosis not present

## 2013-06-08 DIAGNOSIS — M6281 Muscle weakness (generalized): Secondary | ICD-10-CM | POA: Diagnosis not present

## 2013-06-08 DIAGNOSIS — IMO0001 Reserved for inherently not codable concepts without codable children: Secondary | ICD-10-CM | POA: Diagnosis not present

## 2013-06-08 DIAGNOSIS — I129 Hypertensive chronic kidney disease with stage 1 through stage 4 chronic kidney disease, or unspecified chronic kidney disease: Secondary | ICD-10-CM | POA: Diagnosis not present

## 2013-06-08 DIAGNOSIS — F0280 Dementia in other diseases classified elsewhere without behavioral disturbance: Secondary | ICD-10-CM | POA: Diagnosis not present

## 2013-06-08 DIAGNOSIS — R262 Difficulty in walking, not elsewhere classified: Secondary | ICD-10-CM | POA: Diagnosis not present

## 2013-06-08 DIAGNOSIS — M1710 Unilateral primary osteoarthritis, unspecified knee: Secondary | ICD-10-CM | POA: Diagnosis not present

## 2013-06-12 DIAGNOSIS — M6281 Muscle weakness (generalized): Secondary | ICD-10-CM | POA: Diagnosis not present

## 2013-06-12 DIAGNOSIS — M1710 Unilateral primary osteoarthritis, unspecified knee: Secondary | ICD-10-CM | POA: Diagnosis not present

## 2013-06-12 DIAGNOSIS — I129 Hypertensive chronic kidney disease with stage 1 through stage 4 chronic kidney disease, or unspecified chronic kidney disease: Secondary | ICD-10-CM | POA: Diagnosis not present

## 2013-06-12 DIAGNOSIS — IMO0001 Reserved for inherently not codable concepts without codable children: Secondary | ICD-10-CM | POA: Diagnosis not present

## 2013-06-12 DIAGNOSIS — F0280 Dementia in other diseases classified elsewhere without behavioral disturbance: Secondary | ICD-10-CM | POA: Diagnosis not present

## 2013-06-12 DIAGNOSIS — R262 Difficulty in walking, not elsewhere classified: Secondary | ICD-10-CM | POA: Diagnosis not present

## 2013-06-13 DIAGNOSIS — M6281 Muscle weakness (generalized): Secondary | ICD-10-CM | POA: Diagnosis not present

## 2013-06-13 DIAGNOSIS — M1710 Unilateral primary osteoarthritis, unspecified knee: Secondary | ICD-10-CM | POA: Diagnosis not present

## 2013-06-13 DIAGNOSIS — F0280 Dementia in other diseases classified elsewhere without behavioral disturbance: Secondary | ICD-10-CM | POA: Diagnosis not present

## 2013-06-13 DIAGNOSIS — R262 Difficulty in walking, not elsewhere classified: Secondary | ICD-10-CM | POA: Diagnosis not present

## 2013-06-13 DIAGNOSIS — IMO0001 Reserved for inherently not codable concepts without codable children: Secondary | ICD-10-CM | POA: Diagnosis not present

## 2013-06-13 DIAGNOSIS — I129 Hypertensive chronic kidney disease with stage 1 through stage 4 chronic kidney disease, or unspecified chronic kidney disease: Secondary | ICD-10-CM | POA: Diagnosis not present

## 2013-06-15 DIAGNOSIS — IMO0001 Reserved for inherently not codable concepts without codable children: Secondary | ICD-10-CM | POA: Diagnosis not present

## 2013-06-15 DIAGNOSIS — I129 Hypertensive chronic kidney disease with stage 1 through stage 4 chronic kidney disease, or unspecified chronic kidney disease: Secondary | ICD-10-CM | POA: Diagnosis not present

## 2013-06-15 DIAGNOSIS — M1710 Unilateral primary osteoarthritis, unspecified knee: Secondary | ICD-10-CM | POA: Diagnosis not present

## 2013-06-15 DIAGNOSIS — F0280 Dementia in other diseases classified elsewhere without behavioral disturbance: Secondary | ICD-10-CM | POA: Diagnosis not present

## 2013-06-15 DIAGNOSIS — R262 Difficulty in walking, not elsewhere classified: Secondary | ICD-10-CM | POA: Diagnosis not present

## 2013-06-15 DIAGNOSIS — M6281 Muscle weakness (generalized): Secondary | ICD-10-CM | POA: Diagnosis not present

## 2013-06-16 DIAGNOSIS — R262 Difficulty in walking, not elsewhere classified: Secondary | ICD-10-CM | POA: Diagnosis not present

## 2013-06-16 DIAGNOSIS — IMO0001 Reserved for inherently not codable concepts without codable children: Secondary | ICD-10-CM | POA: Diagnosis not present

## 2013-06-16 DIAGNOSIS — I129 Hypertensive chronic kidney disease with stage 1 through stage 4 chronic kidney disease, or unspecified chronic kidney disease: Secondary | ICD-10-CM | POA: Diagnosis not present

## 2013-06-16 DIAGNOSIS — F0280 Dementia in other diseases classified elsewhere without behavioral disturbance: Secondary | ICD-10-CM | POA: Diagnosis not present

## 2013-06-16 DIAGNOSIS — M6281 Muscle weakness (generalized): Secondary | ICD-10-CM | POA: Diagnosis not present

## 2013-06-16 DIAGNOSIS — M1710 Unilateral primary osteoarthritis, unspecified knee: Secondary | ICD-10-CM | POA: Diagnosis not present

## 2013-06-19 DIAGNOSIS — M6281 Muscle weakness (generalized): Secondary | ICD-10-CM | POA: Diagnosis not present

## 2013-06-19 DIAGNOSIS — I129 Hypertensive chronic kidney disease with stage 1 through stage 4 chronic kidney disease, or unspecified chronic kidney disease: Secondary | ICD-10-CM | POA: Diagnosis not present

## 2013-06-19 DIAGNOSIS — F0280 Dementia in other diseases classified elsewhere without behavioral disturbance: Secondary | ICD-10-CM | POA: Diagnosis not present

## 2013-06-19 DIAGNOSIS — IMO0001 Reserved for inherently not codable concepts without codable children: Secondary | ICD-10-CM | POA: Diagnosis not present

## 2013-06-19 DIAGNOSIS — M1710 Unilateral primary osteoarthritis, unspecified knee: Secondary | ICD-10-CM | POA: Diagnosis not present

## 2013-06-19 DIAGNOSIS — R262 Difficulty in walking, not elsewhere classified: Secondary | ICD-10-CM | POA: Diagnosis not present

## 2013-06-20 DIAGNOSIS — IMO0001 Reserved for inherently not codable concepts without codable children: Secondary | ICD-10-CM | POA: Diagnosis not present

## 2013-06-20 DIAGNOSIS — I129 Hypertensive chronic kidney disease with stage 1 through stage 4 chronic kidney disease, or unspecified chronic kidney disease: Secondary | ICD-10-CM | POA: Diagnosis not present

## 2013-06-20 DIAGNOSIS — M6281 Muscle weakness (generalized): Secondary | ICD-10-CM | POA: Diagnosis not present

## 2013-06-20 DIAGNOSIS — M1710 Unilateral primary osteoarthritis, unspecified knee: Secondary | ICD-10-CM | POA: Diagnosis not present

## 2013-06-20 DIAGNOSIS — R262 Difficulty in walking, not elsewhere classified: Secondary | ICD-10-CM | POA: Diagnosis not present

## 2013-06-20 DIAGNOSIS — F0280 Dementia in other diseases classified elsewhere without behavioral disturbance: Secondary | ICD-10-CM | POA: Diagnosis not present

## 2013-06-21 DIAGNOSIS — F0280 Dementia in other diseases classified elsewhere without behavioral disturbance: Secondary | ICD-10-CM | POA: Diagnosis not present

## 2013-06-21 DIAGNOSIS — M1710 Unilateral primary osteoarthritis, unspecified knee: Secondary | ICD-10-CM | POA: Diagnosis not present

## 2013-06-21 DIAGNOSIS — I129 Hypertensive chronic kidney disease with stage 1 through stage 4 chronic kidney disease, or unspecified chronic kidney disease: Secondary | ICD-10-CM | POA: Diagnosis not present

## 2013-06-21 DIAGNOSIS — R262 Difficulty in walking, not elsewhere classified: Secondary | ICD-10-CM | POA: Diagnosis not present

## 2013-06-21 DIAGNOSIS — IMO0001 Reserved for inherently not codable concepts without codable children: Secondary | ICD-10-CM | POA: Diagnosis not present

## 2013-06-21 DIAGNOSIS — M6281 Muscle weakness (generalized): Secondary | ICD-10-CM | POA: Diagnosis not present

## 2013-06-22 DIAGNOSIS — I129 Hypertensive chronic kidney disease with stage 1 through stage 4 chronic kidney disease, or unspecified chronic kidney disease: Secondary | ICD-10-CM | POA: Diagnosis not present

## 2013-06-22 DIAGNOSIS — M6281 Muscle weakness (generalized): Secondary | ICD-10-CM | POA: Diagnosis not present

## 2013-06-22 DIAGNOSIS — M1710 Unilateral primary osteoarthritis, unspecified knee: Secondary | ICD-10-CM | POA: Diagnosis not present

## 2013-06-22 DIAGNOSIS — F0280 Dementia in other diseases classified elsewhere without behavioral disturbance: Secondary | ICD-10-CM | POA: Diagnosis not present

## 2013-06-22 DIAGNOSIS — R262 Difficulty in walking, not elsewhere classified: Secondary | ICD-10-CM | POA: Diagnosis not present

## 2013-06-22 DIAGNOSIS — IMO0001 Reserved for inherently not codable concepts without codable children: Secondary | ICD-10-CM | POA: Diagnosis not present

## 2013-06-26 DIAGNOSIS — I129 Hypertensive chronic kidney disease with stage 1 through stage 4 chronic kidney disease, or unspecified chronic kidney disease: Secondary | ICD-10-CM | POA: Diagnosis not present

## 2013-06-26 DIAGNOSIS — R262 Difficulty in walking, not elsewhere classified: Secondary | ICD-10-CM | POA: Diagnosis not present

## 2013-06-26 DIAGNOSIS — M1710 Unilateral primary osteoarthritis, unspecified knee: Secondary | ICD-10-CM | POA: Diagnosis not present

## 2013-06-26 DIAGNOSIS — F0280 Dementia in other diseases classified elsewhere without behavioral disturbance: Secondary | ICD-10-CM | POA: Diagnosis not present

## 2013-06-26 DIAGNOSIS — IMO0001 Reserved for inherently not codable concepts without codable children: Secondary | ICD-10-CM | POA: Diagnosis not present

## 2013-06-26 DIAGNOSIS — M6281 Muscle weakness (generalized): Secondary | ICD-10-CM | POA: Diagnosis not present

## 2013-06-27 DIAGNOSIS — F0280 Dementia in other diseases classified elsewhere without behavioral disturbance: Secondary | ICD-10-CM | POA: Diagnosis not present

## 2013-06-27 DIAGNOSIS — I129 Hypertensive chronic kidney disease with stage 1 through stage 4 chronic kidney disease, or unspecified chronic kidney disease: Secondary | ICD-10-CM | POA: Diagnosis not present

## 2013-06-27 DIAGNOSIS — IMO0001 Reserved for inherently not codable concepts without codable children: Secondary | ICD-10-CM | POA: Diagnosis not present

## 2013-06-27 DIAGNOSIS — R262 Difficulty in walking, not elsewhere classified: Secondary | ICD-10-CM | POA: Diagnosis not present

## 2013-06-27 DIAGNOSIS — M1710 Unilateral primary osteoarthritis, unspecified knee: Secondary | ICD-10-CM | POA: Diagnosis not present

## 2013-06-27 DIAGNOSIS — M6281 Muscle weakness (generalized): Secondary | ICD-10-CM | POA: Diagnosis not present

## 2013-07-03 DIAGNOSIS — R262 Difficulty in walking, not elsewhere classified: Secondary | ICD-10-CM | POA: Diagnosis not present

## 2013-07-03 DIAGNOSIS — F0280 Dementia in other diseases classified elsewhere without behavioral disturbance: Secondary | ICD-10-CM | POA: Diagnosis not present

## 2013-07-03 DIAGNOSIS — M1710 Unilateral primary osteoarthritis, unspecified knee: Secondary | ICD-10-CM | POA: Diagnosis not present

## 2013-07-03 DIAGNOSIS — IMO0001 Reserved for inherently not codable concepts without codable children: Secondary | ICD-10-CM | POA: Diagnosis not present

## 2013-07-03 DIAGNOSIS — I129 Hypertensive chronic kidney disease with stage 1 through stage 4 chronic kidney disease, or unspecified chronic kidney disease: Secondary | ICD-10-CM | POA: Diagnosis not present

## 2013-07-03 DIAGNOSIS — M6281 Muscle weakness (generalized): Secondary | ICD-10-CM | POA: Diagnosis not present

## 2013-07-05 DIAGNOSIS — R262 Difficulty in walking, not elsewhere classified: Secondary | ICD-10-CM | POA: Diagnosis not present

## 2013-07-05 DIAGNOSIS — IMO0001 Reserved for inherently not codable concepts without codable children: Secondary | ICD-10-CM | POA: Diagnosis not present

## 2013-07-05 DIAGNOSIS — M1710 Unilateral primary osteoarthritis, unspecified knee: Secondary | ICD-10-CM | POA: Diagnosis not present

## 2013-07-05 DIAGNOSIS — F0280 Dementia in other diseases classified elsewhere without behavioral disturbance: Secondary | ICD-10-CM | POA: Diagnosis not present

## 2013-07-05 DIAGNOSIS — M6281 Muscle weakness (generalized): Secondary | ICD-10-CM | POA: Diagnosis not present

## 2013-07-05 DIAGNOSIS — I129 Hypertensive chronic kidney disease with stage 1 through stage 4 chronic kidney disease, or unspecified chronic kidney disease: Secondary | ICD-10-CM | POA: Diagnosis not present

## 2013-07-12 DIAGNOSIS — M6281 Muscle weakness (generalized): Secondary | ICD-10-CM | POA: Diagnosis not present

## 2013-07-12 DIAGNOSIS — R262 Difficulty in walking, not elsewhere classified: Secondary | ICD-10-CM | POA: Diagnosis not present

## 2013-07-12 DIAGNOSIS — IMO0001 Reserved for inherently not codable concepts without codable children: Secondary | ICD-10-CM | POA: Diagnosis not present

## 2013-07-12 DIAGNOSIS — M1710 Unilateral primary osteoarthritis, unspecified knee: Secondary | ICD-10-CM | POA: Diagnosis not present

## 2013-07-12 DIAGNOSIS — I129 Hypertensive chronic kidney disease with stage 1 through stage 4 chronic kidney disease, or unspecified chronic kidney disease: Secondary | ICD-10-CM | POA: Diagnosis not present

## 2013-07-12 DIAGNOSIS — F0280 Dementia in other diseases classified elsewhere without behavioral disturbance: Secondary | ICD-10-CM | POA: Diagnosis not present

## 2013-07-14 DIAGNOSIS — I129 Hypertensive chronic kidney disease with stage 1 through stage 4 chronic kidney disease, or unspecified chronic kidney disease: Secondary | ICD-10-CM | POA: Diagnosis not present

## 2013-07-14 DIAGNOSIS — M6281 Muscle weakness (generalized): Secondary | ICD-10-CM | POA: Diagnosis not present

## 2013-07-14 DIAGNOSIS — F0280 Dementia in other diseases classified elsewhere without behavioral disturbance: Secondary | ICD-10-CM | POA: Diagnosis not present

## 2013-07-14 DIAGNOSIS — R262 Difficulty in walking, not elsewhere classified: Secondary | ICD-10-CM | POA: Diagnosis not present

## 2013-07-14 DIAGNOSIS — M1710 Unilateral primary osteoarthritis, unspecified knee: Secondary | ICD-10-CM | POA: Diagnosis not present

## 2013-07-14 DIAGNOSIS — IMO0001 Reserved for inherently not codable concepts without codable children: Secondary | ICD-10-CM | POA: Diagnosis not present

## 2013-07-17 DIAGNOSIS — M6281 Muscle weakness (generalized): Secondary | ICD-10-CM | POA: Diagnosis not present

## 2013-07-17 DIAGNOSIS — R262 Difficulty in walking, not elsewhere classified: Secondary | ICD-10-CM | POA: Diagnosis not present

## 2013-07-17 DIAGNOSIS — M1710 Unilateral primary osteoarthritis, unspecified knee: Secondary | ICD-10-CM | POA: Diagnosis not present

## 2013-07-17 DIAGNOSIS — I129 Hypertensive chronic kidney disease with stage 1 through stage 4 chronic kidney disease, or unspecified chronic kidney disease: Secondary | ICD-10-CM | POA: Diagnosis not present

## 2013-07-17 DIAGNOSIS — F0280 Dementia in other diseases classified elsewhere without behavioral disturbance: Secondary | ICD-10-CM | POA: Diagnosis not present

## 2013-07-17 DIAGNOSIS — IMO0001 Reserved for inherently not codable concepts without codable children: Secondary | ICD-10-CM | POA: Diagnosis not present

## 2013-07-19 DIAGNOSIS — M6281 Muscle weakness (generalized): Secondary | ICD-10-CM | POA: Diagnosis not present

## 2013-07-19 DIAGNOSIS — R262 Difficulty in walking, not elsewhere classified: Secondary | ICD-10-CM | POA: Diagnosis not present

## 2013-07-19 DIAGNOSIS — I129 Hypertensive chronic kidney disease with stage 1 through stage 4 chronic kidney disease, or unspecified chronic kidney disease: Secondary | ICD-10-CM | POA: Diagnosis not present

## 2013-07-19 DIAGNOSIS — F0280 Dementia in other diseases classified elsewhere without behavioral disturbance: Secondary | ICD-10-CM | POA: Diagnosis not present

## 2013-07-19 DIAGNOSIS — M1710 Unilateral primary osteoarthritis, unspecified knee: Secondary | ICD-10-CM | POA: Diagnosis not present

## 2013-07-19 DIAGNOSIS — IMO0001 Reserved for inherently not codable concepts without codable children: Secondary | ICD-10-CM | POA: Diagnosis not present

## 2013-08-25 ENCOUNTER — Observation Stay (HOSPITAL_COMMUNITY)
Admission: EM | Admit: 2013-08-25 | Discharge: 2013-08-26 | Disposition: A | Discharge status: Home / Self Care | Payer: Medicare Other | Attending: Internal Medicine | Admitting: Internal Medicine

## 2013-08-25 ENCOUNTER — Emergency Department (HOSPITAL_COMMUNITY): Payer: Medicare Other

## 2013-08-25 ENCOUNTER — Encounter (HOSPITAL_COMMUNITY): Payer: Self-pay | Admitting: Emergency Medicine

## 2013-08-25 DIAGNOSIS — Z9181 History of falling: Secondary | ICD-10-CM | POA: Insufficient documentation

## 2013-08-25 DIAGNOSIS — M25559 Pain in unspecified hip: Secondary | ICD-10-CM | POA: Diagnosis not present

## 2013-08-25 DIAGNOSIS — R627 Adult failure to thrive: Secondary | ICD-10-CM | POA: Diagnosis not present

## 2013-08-25 DIAGNOSIS — S0990XA Unspecified injury of head, initial encounter: Secondary | ICD-10-CM | POA: Diagnosis not present

## 2013-08-25 DIAGNOSIS — IMO0002 Reserved for concepts with insufficient information to code with codable children: Secondary | ICD-10-CM

## 2013-08-25 DIAGNOSIS — I129 Hypertensive chronic kidney disease with stage 1 through stage 4 chronic kidney disease, or unspecified chronic kidney disease: Secondary | ICD-10-CM | POA: Insufficient documentation

## 2013-08-25 DIAGNOSIS — R5381 Other malaise: Secondary | ICD-10-CM | POA: Diagnosis not present

## 2013-08-25 DIAGNOSIS — Z79899 Other long term (current) drug therapy: Secondary | ICD-10-CM | POA: Diagnosis not present

## 2013-08-25 DIAGNOSIS — J9819 Other pulmonary collapse: Secondary | ICD-10-CM | POA: Diagnosis not present

## 2013-08-25 DIAGNOSIS — R05 Cough: Secondary | ICD-10-CM | POA: Insufficient documentation

## 2013-08-25 DIAGNOSIS — R0602 Shortness of breath: Secondary | ICD-10-CM | POA: Diagnosis not present

## 2013-08-25 DIAGNOSIS — Z8673 Personal history of transient ischemic attack (TIA), and cerebral infarction without residual deficits: Secondary | ICD-10-CM | POA: Insufficient documentation

## 2013-08-25 DIAGNOSIS — R059 Cough, unspecified: Secondary | ICD-10-CM | POA: Diagnosis not present

## 2013-08-25 DIAGNOSIS — E039 Hypothyroidism, unspecified: Secondary | ICD-10-CM | POA: Diagnosis not present

## 2013-08-25 DIAGNOSIS — N183 Chronic kidney disease, stage 3 unspecified: Secondary | ICD-10-CM | POA: Diagnosis not present

## 2013-08-25 DIAGNOSIS — K219 Gastro-esophageal reflux disease without esophagitis: Secondary | ICD-10-CM | POA: Insufficient documentation

## 2013-08-25 DIAGNOSIS — F0390 Unspecified dementia without behavioral disturbance: Secondary | ICD-10-CM | POA: Insufficient documentation

## 2013-08-25 DIAGNOSIS — N179 Acute kidney failure, unspecified: Secondary | ICD-10-CM

## 2013-08-25 DIAGNOSIS — Z7982 Long term (current) use of aspirin: Secondary | ICD-10-CM | POA: Diagnosis not present

## 2013-08-25 DIAGNOSIS — E860 Dehydration: Secondary | ICD-10-CM

## 2013-08-25 DIAGNOSIS — N189 Chronic kidney disease, unspecified: Secondary | ICD-10-CM | POA: Diagnosis not present

## 2013-08-25 DIAGNOSIS — R531 Weakness: Secondary | ICD-10-CM

## 2013-08-25 HISTORY — DX: Hypothyroidism, unspecified: E03.9

## 2013-08-25 HISTORY — DX: Cerebral infarction, unspecified: I63.9

## 2013-08-25 HISTORY — DX: Unspecified osteoarthritis, unspecified site: M19.90

## 2013-08-25 HISTORY — DX: Gastro-esophageal reflux disease without esophagitis: K21.9

## 2013-08-25 HISTORY — DX: Unspecified dementia, unspecified severity, without behavioral disturbance, psychotic disturbance, mood disturbance, and anxiety: F03.90

## 2013-08-25 HISTORY — DX: Essential (primary) hypertension: I10

## 2013-08-25 LAB — URINE MICROSCOPIC-ADD ON

## 2013-08-25 LAB — URINALYSIS, ROUTINE W REFLEX MICROSCOPIC
Glucose, UA: NEGATIVE mg/dL
Leukocytes, UA: NEGATIVE
Protein, ur: 30 mg/dL — AB
Specific Gravity, Urine: 1.019 (ref 1.005–1.030)
Urobilinogen, UA: 1 mg/dL (ref 0.0–1.0)

## 2013-08-25 LAB — COMPREHENSIVE METABOLIC PANEL
ALT: 28 U/L (ref 0–53)
AST: 45 U/L — ABNORMAL HIGH (ref 0–37)
Albumin: 3.1 g/dL — ABNORMAL LOW (ref 3.5–5.2)
BUN: 26 mg/dL — ABNORMAL HIGH (ref 6–23)
Calcium: 9 mg/dL (ref 8.4–10.5)
GFR calc Af Amer: 40 mL/min — ABNORMAL LOW (ref >90)
GFR calc non Af Amer: 35 mL/min — ABNORMAL LOW (ref >90)
Potassium: 4.5 mEq/L (ref 3.5–5.1)
Sodium: 139 mEq/L (ref 135–145)
Total Protein: 7.7 g/dL (ref 6.0–8.3)

## 2013-08-25 LAB — POCT I-STAT, CHEM 8
BUN: 31 mg/dL — ABNORMAL HIGH (ref 6–23)
Calcium, Ion: 1.12 mmol/L — ABNORMAL LOW (ref 1.13–1.30)
Chloride: 108 mEq/L (ref 96–112)
Creatinine, Ser: 1.8 mg/dL — ABNORMAL HIGH (ref 0.50–1.35)
Glucose, Bld: 132 mg/dL — ABNORMAL HIGH (ref 70–99)
HCT: 38 % — ABNORMAL LOW (ref 39.0–52.0)

## 2013-08-25 LAB — CBC WITH DIFFERENTIAL/PLATELET
Basophils Relative: 1 % (ref 0–1)
Eosinophils Absolute: 0.3 10*3/uL (ref 0.0–0.7)
Hemoglobin: 12.3 g/dL — ABNORMAL LOW (ref 13.0–17.0)
Lymphs Abs: 2.6 10*3/uL (ref 0.7–4.0)
MCH: 30.3 pg (ref 26.0–34.0)
Monocytes Relative: 9 % (ref 3–12)
Neutro Abs: 3.2 10*3/uL (ref 1.7–7.7)
Neutrophils Relative %: 48 % (ref 43–77)
Platelets: 129 10*3/uL — ABNORMAL LOW (ref 150–400)
RBC: 4.06 MIL/uL — ABNORMAL LOW (ref 4.22–5.81)

## 2013-08-25 MED ORDER — PNEUMOCOCCAL VAC POLYVALENT 25 MCG/0.5ML IJ INJ: 0.5000 mL | INJECTION | INTRAMUSCULAR | Status: DC

## 2013-08-25 MED ORDER — OXYCODONE HCL 5 MG PO TABS: 5.0000 mg | ORAL_TABLET | ORAL | Status: DC | PRN

## 2013-08-25 MED ORDER — ASPIRIN EC 81 MG PO TBEC
81.0000 mg | DELAYED_RELEASE_TABLET | Freq: Every day | ORAL | Status: DC
  Administered 2013-08-26: 10:00:00 81 mg via ORAL
  Filled 2013-08-25: qty 1

## 2013-08-25 MED ORDER — OMEGA-3-ACID ETHYL ESTERS 1 G PO CAPS
1.0000 g | ORAL_CAPSULE | Freq: Every day | ORAL | Status: DC
  Administered 2013-08-26: 1 g via ORAL
  Filled 2013-08-25: qty 1

## 2013-08-25 MED ORDER — LEVOTHYROXINE SODIUM 75 MCG PO TABS
75.0000 ug | ORAL_TABLET | Freq: Every day | ORAL | Status: DC
  Administered 2013-08-26: 75 ug via ORAL
  Filled 2013-08-25 (×2): qty 1

## 2013-08-25 MED ORDER — TAMSULOSIN HCL 0.4 MG PO CAPS
0.4000 mg | ORAL_CAPSULE | Freq: Every day | ORAL | Status: DC
  Administered 2013-08-25: 18:00:00 0.4 mg via ORAL
  Filled 2013-08-25 (×2): qty 1

## 2013-08-25 MED ORDER — SENNOSIDES-DOCUSATE SODIUM 8.6-50 MG PO TABS: 1.0000 | ORAL_TABLET | Freq: Every evening | ORAL | Status: DC | PRN

## 2013-08-25 MED ORDER — ONDANSETRON HCL 4 MG PO TABS: 4.0000 mg | ORAL_TABLET | Freq: Four times a day (QID) | ORAL | Status: DC | PRN

## 2013-08-25 MED ORDER — ACETAMINOPHEN 325 MG PO TABS: 650.0000 mg | ORAL_TABLET | Freq: Four times a day (QID) | ORAL | Status: DC | PRN

## 2013-08-25 MED ORDER — IPRATROPIUM BROMIDE 0.02 % IN SOLN
0.5000 mg | Freq: Once | RESPIRATORY_TRACT | Status: AC
  Administered 2013-08-25: 0.5 mg via RESPIRATORY_TRACT
  Filled 2013-08-25: qty 2.5

## 2013-08-25 MED ORDER — DONEPEZIL HCL 5 MG PO TABS
5.0000 mg | ORAL_TABLET | Freq: Every day | ORAL | Status: DC
  Administered 2013-08-25: 23:00:00 5 mg via ORAL
  Filled 2013-08-25 (×2): qty 1

## 2013-08-25 MED ORDER — SODIUM CHLORIDE 0.9 % IV SOLN
INTRAVENOUS | Status: DC
  Administered 2013-08-25 – 2013-08-26 (×2): via INTRAVENOUS

## 2013-08-25 MED ORDER — ENOXAPARIN SODIUM 30 MG/0.3ML ~~LOC~~ SOLN
30.0000 mg | SUBCUTANEOUS | Status: DC
  Filled 2013-08-25 (×2): qty 0.3

## 2013-08-25 MED ORDER — INFLUENZA VAC SPLIT QUAD 0.5 ML IM SUSP: 0.5000 mL | INTRAMUSCULAR | Status: DC

## 2013-08-25 MED ORDER — SODIUM CHLORIDE 0.9 % IV BOLUS (SEPSIS)
1000.0000 mL | Freq: Once | INTRAVENOUS | Status: AC
  Administered 2013-08-25: 1000 mL via INTRAVENOUS

## 2013-08-25 MED ORDER — ONDANSETRON HCL 4 MG/2ML IJ SOLN: 4.0000 mg | Freq: Four times a day (QID) | INTRAMUSCULAR | Status: DC | PRN

## 2013-08-25 MED ORDER — PANTOPRAZOLE SODIUM 40 MG PO TBEC
40.0000 mg | DELAYED_RELEASE_TABLET | Freq: Every day | ORAL | Status: DC
  Administered 2013-08-26: 40 mg via ORAL
  Filled 2013-08-25: qty 1

## 2013-08-25 MED ORDER — ALBUTEROL SULFATE (5 MG/ML) 0.5% IN NEBU
10.0000 mg | INHALATION_SOLUTION | Freq: Once | RESPIRATORY_TRACT | Status: AC
  Administered 2013-08-25: 10 mg via RESPIRATORY_TRACT
  Filled 2013-08-25: qty 2

## 2013-08-25 MED ORDER — LORAZEPAM 2 MG/ML IJ SOLN
0.5000 mg | Freq: Once | INTRAMUSCULAR | Status: AC
  Administered 2013-08-25: 0.5 mg via INTRAVENOUS
  Filled 2013-08-25: qty 1

## 2013-08-25 MED ORDER — ACETAMINOPHEN 650 MG RE SUPP: 650.0000 mg | Freq: Four times a day (QID) | RECTAL | Status: DC | PRN

## 2013-08-25 MED ORDER — METOPROLOL TARTRATE 25 MG PO TABS
25.0000 mg | ORAL_TABLET | Freq: Two times a day (BID) | ORAL | Status: DC
  Administered 2013-08-25 – 2013-08-26 (×2): 25 mg via ORAL
  Filled 2013-08-25 (×3): qty 1

## 2013-08-25 MED ORDER — ALBUTEROL SULFATE (5 MG/ML) 0.5% IN NEBU
2.5000 mg | INHALATION_SOLUTION | RESPIRATORY_TRACT | Status: DC | PRN
  Administered 2013-08-25: 18:00:00 2.5 mg via RESPIRATORY_TRACT
  Filled 2013-08-25: qty 0.5

## 2013-08-25 NOTE — Progress Notes (Signed)
Clinical Social Work Department CLINICAL SOCIAL WORK PLACEMENT NOTE 08/25/2013  Patient:  Bryce, Potter  Account Number:  [id number] Admit date:  08/25/2013  Clinical Social Worker:  Doree Albee  Date/time:  08/25/2013 04:37 PM  Clinical Social Work is seeking post-discharge placement for this patient at the following level of care:   SKILLED NURSING   (*CSW will update this form in Epic as items are completed)   08/25/2013  Patient/family provided with [facility] Department of Clinical Social Work's list of facilities offering this level of care within the geographic area requested by the patient (or if unable, by the patient's family).  08/25/2013  Patient/family informed of their freedom to choose among providers that offer the needed level of care, that participate in Medicare, Medicaid or managed care program needed by the patient, have an available bed and are willing to accept the patient.  08/25/2013  Patient/family informed of [facility]' ownership interest in [facility], as well as of the fact that they are under no obligation to receive care at this facility.  PASARR submitted to EDS on 08/25/2013 PASARR number received from EDS on 08/25/2013  FL2 transmitted to all facilities in geographic area requested by pt/family on  08/25/2013 FL2 transmitted to all facilities within larger geographic area on   Patient informed that his/her managed care company has contracts with or will negotiate with  certain facilities, including the following:     Patient/family informed of bed offers received:   Patient chooses bed at  Physician recommends and patient chooses bed at    Patient to be transferred to  on   Patient to be transferred to facility by   The following physician request were entered in Epic:   Additional Comments:   .Catha Gosselin, LCSW [phone]  ED CSW .08/25/2013 1638pm

## 2013-08-25 NOTE — ED Notes (Signed)
Patient transported to X-ray 

## 2013-08-25 NOTE — ED Notes (Addendum)
Per EMS: Pt reports having multiple falls that began three weeks ago. Pt reports bilateral hip pain. Pt reports having a chronic cough. Pt was recently seen by PCP and given tylenol with codeine, which he reports helped the pain. EMS reports no abnormality to hips and family reports the patient was ambulating prior to EMS arrival. EMS reports the patient was wheezing and the patient was given 5 mg of albuterol in route.

## 2013-08-25 NOTE — H&P (Signed)
[facility]          History and Physical    PCP:   No primary provider on file.   Chief Complaint:  Weakness, falls, cough  HPI: Pleasant 79 y/o man with PMH significant for hypothyroidism, CKD. He presents to the hospital with falls that have been occuring over the past 3 weeks. Last night he started having a cough, no sputum production. No fevers or chills. No sick contacts or recent travel. The daughter, Magda Paganini, brings him in today seeking placement as she is no longer able to care for him at home. Workup in the ED shows a normal Head CT, CXR without acute cardiopulmonary disease, labs are essentially normal with the exception of a Cr of 1.64 (was 1.32 in 3/10, which is the last measurement on record). We have been asked to evaluate him for potential admission.  Allergies:  No Known Allergies    Past Medical History  Diagnosis Date  . Stroke in age 23's - 29's  . Hypertension   . Hypothyroidism   . Dementia   . GERD (gastroesophageal reflux disease)   . Arthritis     Past Surgical History  Procedure Laterality Date  . Cataract extraction Bilateral     Prior to Admission medications   Medication Sig Start Date End Date Taking? Authorizing Provider  acetaminophen-codeine (TYLENOL #3) 300-30 MG per tablet Take 1 tablet by mouth every 4 (four) hours as needed for moderate pain.   Yes Historical Provider, MD  aspirin EC 81 MG tablet Take 81 mg by mouth daily.   Yes Historical Provider, MD  donepezil (ARICEPT) 5 MG tablet Take 5 mg by mouth at bedtime.   Yes Historical Provider, MD  levothyroxine (SYNTHROID, LEVOTHROID) 75 MCG tablet Take 75 mcg by mouth daily before breakfast.   Yes Historical Provider, MD  metoprolol tartrate (LOPRESSOR) 25 MG tablet Take 25 mg by mouth 2 (two) times daily.   Yes Historical Provider, MD  omega-3 acid ethyl esters (LOVAZA) 1 G capsule Take 1 g by mouth daily.   Yes Historical Provider, MD  omeprazole (PRILOSEC) 20 MG capsule  Take 20 mg by mouth daily.   Yes Historical Provider, MD  tamsulosin (FLOMAX) 0.4 MG CAPS capsule Take 0.4 mg by mouth daily after supper.   Yes Historical Provider, MD    Social History:  reports that he has never smoked. He has never used smokeless tobacco. He reports that he does not drink alcohol or use illicit drugs.  History reviewed. No pertinent family history.  Review of Systems:  Constitutional: Denies fever, chills, diaphoresis, appetite change and fatigue.  HEENT: Denies photophobia, eye pain, redness, hearing loss, ear pain, congestion, sore throat, rhinorrhea, sneezing, mouth sores, trouble swallowing, neck pain, neck stiffness and tinnitus.   Respiratory: Denies SOB, DOE, chest tightness,  and wheezing, positive for cough.   Cardiovascular: Denies chest pain, palpitations and leg swelling.  Gastrointestinal: Denies nausea, vomiting, abdominal pain, diarrhea, constipation, blood in stool and abdominal distention.  Genitourinary: Denies dysuria, urgency, frequency, hematuria, flank pain and difficulty urinating.  Endocrine: Denies: hot or cold intolerance, sweats, changes in hair or nails, polyuria, polydipsia. Musculoskeletal: Denies myalgias, back pain, joint swelling, arthralgias and gait problem.  Skin: Denies pallor, rash and wound.  Neurological: Denies dizziness, seizures, syncope, weakness, light-headedness, numbness and headaches.  Hematological: Denies adenopathy. Easy bruising, personal or family bleeding history  Psychiatric/Behavioral: Denies suicidal ideation, mood changes, confusion, nervousness, sleep disturbance and agitation   Physical Exam: Blood pressure  142/60, pulse 106, temperature 98.5 F (36.9 C), temperature source Oral, resp. rate 20, SpO2 97.00%. Gen: AA Ox3, NAD HEENT: [place]/AT/PERRL/EOMI Neck: supple, no JVD, no LAD, no bruits, no goiter. CV: RRR, no M/R/G Lungs: CTA B Abd: S/NT/ND/+BS Ext: no C/C/E, +pedal pulses Neuro: non-focal.  Labs on  Admission:  Results for orders placed during the hospital encounter of 08/25/13 (from the past 48 hour(s))  CBC WITH DIFFERENTIAL     Status: Abnormal   Collection Time    08/25/13  1:15 PM      Result Value Range   WBC 6.7  4.0 - 10.5 K/uL   RBC 4.06 (*) 4.22 - 5.81 MIL/uL   Hemoglobin 12.3 (*) 13.0 - 17.0 g/dL   HCT 16.1 (*) 09.6 - 04.5 %   MCV 90.1  78.0 - 100.0 fL   MCH 30.3  26.0 - 34.0 pg   MCHC 33.6  30.0 - 36.0 g/dL   RDW 40.9  81.1 - 91.4 %   Platelets 129 (*) 150 - 400 K/uL   Neutrophils Relative % 48  43 - 77 %   Neutro Abs 3.2  1.7 - 7.7 K/uL   Lymphocytes Relative 38  12 - 46 %   Lymphs Abs 2.6  0.7 - 4.0 K/uL   Monocytes Relative 9  3 - 12 %   Monocytes Absolute 0.6  0.1 - 1.0 K/uL   Eosinophils Relative 4  0 - 5 %   Eosinophils Absolute 0.3  0.0 - 0.7 K/uL   Basophils Relative 1  0 - 1 %   Basophils Absolute 0.0  0.0 - 0.1 K/uL  COMPREHENSIVE METABOLIC PANEL     Status: Abnormal   Collection Time    08/25/13  2:11 PM      Result Value Range   Sodium 139  135 - 145 mEq/L   Potassium 4.5  3.5 - 5.1 mEq/L   Chloride 106  96 - 112 mEq/L   CO2 19  19 - 32 mEq/L   Glucose, Bld 132 (*) 70 - 99 mg/dL   BUN 26 (*) 6 - 23 mg/dL   Creatinine, Ser 7.82 (*) 0.50 - 1.35 mg/dL   Calcium 9.0  8.4 - 95.6 mg/dL   Total Protein 7.7  6.0 - 8.3 g/dL   Albumin 3.1 (*) 3.5 - 5.2 g/dL   AST 45 (*) 0 - 37 U/L   ALT 28  0 - 53 U/L   Alkaline Phosphatase 50  39 - 117 U/L   Total Bilirubin 0.3  0.3 - 1.2 mg/dL   GFR calc non Af Amer 35 (*) >90 mL/min   GFR calc Af Amer 40 (*) >90 mL/min   Comment: (NOTE)     The eGFR has been calculated using the CKD EPI equation.     This calculation has not been validated in all clinical situations.     eGFR's persistently <90 mL/min signify possible Chronic Kidney     Disease.  POCT I-STAT, CHEM 8     Status: Abnormal   Collection Time    08/25/13  2:21 PM      Result Value Range   Sodium 141  135 - 145 mEq/L   Potassium 4.5  3.5 - 5.1  mEq/L   Chloride 108  96 - 112 mEq/L   BUN 31 (*) 6 - 23 mg/dL   Creatinine, Ser 2.13 (*) 0.50 - 1.35 mg/dL   Glucose, Bld 086 (*) 70 - 99 mg/dL   Calcium,  Ion 1.12 (*) 1.13 - 1.30 mmol/L   TCO2 20  0 - 100 mmol/L   Hemoglobin 12.9 (*) 13.0 - 17.0 g/dL   HCT 16.1 (*) 09.6 - 04.5 %  URINALYSIS, ROUTINE W REFLEX MICROSCOPIC     Status: Abnormal   Collection Time    08/25/13  2:27 PM      Result Value Range   Color, Urine YELLOW  YELLOW   APPearance CLOUDY (*) CLEAR   Specific Gravity, Urine 1.019  1.005 - 1.030   pH 5.5  5.0 - 8.0   Glucose, UA NEGATIVE  NEGATIVE mg/dL   Hgb urine dipstick SMALL (*) NEGATIVE   Bilirubin Urine NEGATIVE  NEGATIVE   Ketones, ur NEGATIVE  NEGATIVE mg/dL   Protein, ur 30 (*) NEGATIVE mg/dL   Urobilinogen, UA 1.0  0.0 - 1.0 mg/dL   Nitrite NEGATIVE  NEGATIVE   Leukocytes, UA NEGATIVE  NEGATIVE  URINE MICROSCOPIC-ADD ON     Status: Abnormal   Collection Time    08/25/13  2:27 PM      Result Value Range   Squamous Epithelial / LPF RARE  RARE   WBC, UA 0-2  <3 WBC/hpf   Bacteria, UA RARE  RARE   Casts GRANULAR CAST (*) NEGATIVE    Radiological Exams on Admission: Dg Chest 2 View  08/25/2013   CLINICAL DATA:  Shortness of breath and cough, history of hypertension  EXAM: CHEST  2 VIEW  COMPARISON:  11/16/2008; 11/09/2008  FINDINGS: Grossly unchanged enlarged cardiac silhouette and mediastinal contours. Evaluation of the retrosternal clear space is obscured secondary to overlying soft tissues. Minimal bibasilar opacities favored to represent atelectasis. No focal airspace opacities. No pleural effusion or pneumothorax. No definite evidence of edema. No acute osseus abnormalities.  IMPRESSION: Minimal bibasilar atelectasis without definite acute cardiopulmonary disease.   Electronically Signed   By: Simonne Come M.D.   On: 08/25/2013 14:10   Dg Pelvis 1-2 Views  08/25/2013   CLINICAL DATA:  Pain in the lower back, legs, and hips.  EXAM: PELVIS - 1-2 VIEW   COMPARISON:  None.  FINDINGS: There is no evidence of pelvic fracture or diastasis. No other pelvic bone lesions are seen. Mild degenerative change lower lumbar spine.  IMPRESSION: No pelvic lesions are evident.   Electronically Signed   By: Davonna Belling M.D.   On: 08/25/2013 14:14   Ct Head Wo Contrast  08/25/2013   CLINICAL DATA:  Multiple falls.  EXAM: CT HEAD WITHOUT CONTRAST  TECHNIQUE: Contiguous axial images were obtained from the base of the skull through the vertex without intravenous contrast.  COMPARISON:  None.  FINDINGS: Mucosal thickening is seen in both maxillary sinuses. Bony calvarium appears intact. Diffuse cortical atrophy is noted. Chronic ischemic white matter disease is noted. No mass effect or midline shift is noted. Ventricular size is within normal limits given the degree of atrophy present. There is no evidence of mass lesion, hemorrhage or acute infarction. 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  IMPRESSION: Diffuse cortical atrophy. Chronic ischemic white matter disease. No acute intracranial abnormality seen.   Electronically Signed   By: Roque Lias M.D.   On: 08/25/2013 13:55    Assessment/Plan Active Problems:   Weakness generalized   FTT (failure to thrive) in adult   ARF (acute renal failure)   CKD (chronic kidney disease), stage III   Cough   Hypothyroidism   Generalized Weakness/Failure to Thrive -Etiology remains unclear. -Check  TSH/B12. -PT/OT evals per daughter's request. -Have discussed with daughter that he will NOT meet inpatient criteria and as such, can not be placed to SNF directly from the hospital. She has been given a list of HH agencies by CM. She knows that this will be an overnight admission only and that they may receive a hospital bill for this admission if Medicare decides not to cover. She is willing to pay out of pocket for both the hospital stay and the SNF.  CKD with ?ARF -Last known Cr was 1.32 on  10/2008. -Is 1.64 today. -Do not know if this represents acute on CKD vs just worsening of his baseline over the past 4 years. -Will give some IVF to see if this corrects.  Cough -Afebrile, no leukocytosis. -No infiltrate on CXR. -Check flu PCR. -Treat symptomatically.  Hypothyroidism -Check TSH -Continue home dose of synthroid.  DVT Prophylaxis -Lovenox  Code Status -Full Code  Time Spent on Admission: 75 minutes  HERNANDEZ ACOSTA,ESTELA [facility] Pager: [phone] 08/25/2013, 4:34 PM

## 2013-08-25 NOTE — ED Provider Notes (Addendum)
CSN: [id number]     Arrival date & time 08/25/13  1218 History   First MD Initiated Contact with Patient 08/25/13 1245     Chief Complaint  Patient presents with  . Fall  . Hip Pain    Bilateral   . Cough   (Consider location/radiation/quality/duration/timing/severity/associated sxs/prior Treatment) The history is provided by the patient and a relative.  Bryce Potter is a 79 y.o. male hx of dementia, HTN, HL here with weakness. Diffuse weakness for several days. Unable to walk for 3 days due to weakness. No fevers or chills. He has been coughing and was prescribed tylenol #3 for it. As per daughter, he has been drinking and eating less but not vomiting. He lives at home with his wife. He was noted to be wheezing by EMS and given albuterol.   Level V caveat- dementia    Past Medical History  Diagnosis Date  . Stroke in age 33's - 28's  . Hypertension   . Hypothyroidism   . Dementia   . GERD (gastroesophageal reflux disease)   . Arthritis    Past Surgical History  Procedure Laterality Date  . Cataract extraction Bilateral    History reviewed. No pertinent family history. History  Substance Use Topics  . Smoking status: Never Smoker   . Smokeless tobacco: Never Used  . Alcohol Use: No    Review of Systems  Respiratory: Positive for cough.   All other systems reviewed and are negative.    Allergies  Review of patient's allergies indicates no known allergies.  Home Medications   Current Outpatient Rx  Name  Route  Sig  Dispense  Refill  . acetaminophen-codeine (TYLENOL #3) 300-30 MG per tablet   Oral   Take 1 tablet by mouth every 4 (four) hours as needed for moderate pain.         Marland Kitchen aspirin EC 81 MG tablet   Oral   Take 81 mg by mouth daily.         Marland Kitchen donepezil (ARICEPT) 5 MG tablet   Oral   Take 5 mg by mouth at bedtime.         Marland Kitchen levothyroxine (SYNTHROID, LEVOTHROID) 75 MCG tablet   Oral   Take 75 mcg by mouth daily before breakfast.         .  metoprolol tartrate (LOPRESSOR) 25 MG tablet   Oral   Take 25 mg by mouth 2 (two) times daily.         Marland Kitchen omega-3 acid ethyl esters (LOVAZA) 1 G capsule   Oral   Take 1 g by mouth daily.         Marland Kitchen omeprazole (PRILOSEC) 20 MG capsule   Oral   Take 20 mg by mouth daily.         . tamsulosin (FLOMAX) 0.4 MG CAPS capsule   Oral   Take 0.4 mg by mouth daily after supper.         Marland Kitchen dextromethorphan-guaiFENesin (MUCINEX DM) 30-600 MG per 12 hr tablet   Oral   Take 1 tablet by mouth 2 (two) times daily.          BP 143/73  Pulse 82  Temp(Src) 98.5 F (36.9 C) (Oral)  Resp 20  Ht 5\' 7"  (1.702 m)  Wt 206 lb 5.6 oz (93.6 kg)  BMI 32.31 kg/m2  SpO2 100% Physical Exam  Nursing note and vitals reviewed. Constitutional:  Chronically ill, demented, coughing   HENT:  Head: Normocephalic.  MM slightly dry   Eyes: Conjunctivae are normal. Pupils are equal, round, and reactive to light.  Neck: Normal range of motion. Neck supple.  Cardiovascular: Normal rate, regular rhythm and normal heart sounds.   Pulmonary/Chest:  Slightly tachypneic, + diffuse wheezing. No retractions.   Abdominal: Soft. Bowel sounds are normal. He exhibits no distension. There is no tenderness. There is no rebound.  Musculoskeletal: Normal range of motion.  Neurological: He is alert.  Demented, moving all extremities   Skin: Skin is warm and dry.  Psychiatric: He has a normal mood and affect. His behavior is normal. Judgment and thought content normal.    ED Course  Procedures (including critical care time) Labs Review Labs Reviewed  CBC WITH DIFFERENTIAL - Abnormal; Notable for the following:    RBC 4.06 (*)    Hemoglobin 12.3 (*)    HCT 36.6 (*)    Platelets 129 (*)    All other components within normal limits  URINALYSIS, ROUTINE W REFLEX MICROSCOPIC - Abnormal; Notable for the following:    APPearance CLOUDY (*)    Hgb urine dipstick SMALL (*)    Protein, ur 30 (*)    All other  components within normal limits  COMPREHENSIVE METABOLIC PANEL - Abnormal; Notable for the following:    Glucose, Bld 132 (*)    BUN 26 (*)    Creatinine, Ser 1.64 (*)    Albumin 3.1 (*)    AST 45 (*)    GFR calc non Af Amer 35 (*)    GFR calc Af Amer 40 (*)    All other components within normal limits  URINE MICROSCOPIC-ADD ON - Abnormal; Notable for the following:    Casts GRANULAR CAST (*)    All other components within normal limits  TSH - Abnormal; Notable for the following:    TSH 4.884 (*)    All other components within normal limits  BASIC METABOLIC PANEL - Abnormal; Notable for the following:    Glucose, Bld 118 (*)    BUN 24 (*)    Creatinine, Ser 1.55 (*)    GFR calc non Af Amer 37 (*)    GFR calc Af Amer 43 (*)    All other components within normal limits  CBC - Abnormal; Notable for the following:    RBC 3.88 (*)    Hemoglobin 11.7 (*)    HCT 35.0 (*)    All other components within normal limits  POCT I-STAT, CHEM 8 - Abnormal; Notable for the following:    BUN 31 (*)    Creatinine, Ser 1.80 (*)    Glucose, Bld 132 (*)    Calcium, Ion 1.12 (*)    Hemoglobin 12.9 (*)    HCT 38.0 (*)    All other components within normal limits  INFLUENZA PANEL BY PCR  VITAMIN B12  RPR   Imaging Review No results found.  EKG Interpretation    Date/Time:  Friday August 25 2013 13:09:02 EST Ventricular Rate:  101 PR Interval:  182 QRS Duration: 82 QT Interval:  387 QTC Calculation: 502 R Axis:   61 Text Interpretation:  Sinus tachycardia Consider left atrial enlargement Nonspecific T abnormalities, inferior leads Prolonged QT interval ED PHYSICIAN INTERPRETATION AVAILABLE IN [facility] Confirmed by TEST, RECORD ([id number]) on 08/27/2013 8:26:08 AM Also confirmed by Silverio Lay  MD, Dwayna Kentner [phone])  on 08/31/2013 2:59:52 PM            Date: 08/25/2013  Rate: 101  Rhythm: sinus tachycardia  QRS Axis: normal  Intervals: normal  ST/T Wave abnormalities: nonspecific ST  changes  Conduction Disutrbances:none  Narrative Interpretation:   Old EKG Reviewed: unchanged    MDM   1. Dehydration   2. Failure to thrive   3. Renal failure (ARF), acute on chronic   4. ARF (acute renal failure)   5. CKD (chronic kidney disease), stage III   6. Cough   7. Weakness generalized   8. FTT (failure to thrive) in adult   44. Hypothyroidism    Bryce Potter is a 79 y.o. male here with weakness, dehydration, coughing. Likely having bronchitis. Will get xray to r/o pneumonia.  3: 11 PM CT head unremarkable. CXR unremarkable. Cr slightly elevated. I called Dr. Ardyth Harps, who states that patient doesn't qualify for inpatient stay for rehab. Request social work and case management eval. I talked to Selena Batten the case manager who talked to the daughter. Daughter request if we can observe him in the hospital overnight and she would be able to take him tomorrow.    4 PM  I talked to Dr. Ardyth Harps, who will admit for observation for dehydration, failure to thrive, acute on chronic renal failure.    Richardean Canal, MD 08/25/13 1608  Richardean Canal, MD 08/31/13 [phone]

## 2013-08-25 NOTE — Progress Notes (Signed)
Clinical Social Work Department BRIEF PSYCHOSOCIAL ASSESSMENT 08/25/2013  Patient:  Bryce Potter, Bryce Potter     Account Number:  [id number]     Admit date:  08/25/2013  Clinical Social Worker:  Doree Albee  Date/Time:  08/25/2013 04:32 PM  Referred by:  Physician  Date Referred:  08/25/2013 Referred for  SNF Placement   Other Referral:   Interview type:  Family Other interview type:   patient daughter    PSYCHOSOCIAL DATA Living Status:  FAMILY Admitted from facility:   Level of care:   Primary support name:  Bryce Potter Primary support relationship to patient:  CHILD, ADULT Degree of support available:   strong    CURRENT CONCERNS Current Concerns  Post-Acute Placement   Other Concerns:    SOCIAL WORK ASSESSMENT / PLAN CSW spoke iwht pt daughter via phone. Patient curerntly lives at home with patient daughter. Patient daughter is refusing to have patient reutnr home. Patient admitted for observation due to dehydration and failure to thrive. Patient daughter interested in [facility], and is willing to pay out of pocket. Patient Daughter also interested in other [place] skilled nursing facilities.    CSW iniatied snf search, please see placement not for progress of placement.   Assessment/plan status:  Psychosocial Support/Ongoing Assessment of Needs Other assessment/ plan:   Information/referral to community resources:   skilled nursing facility    PATIENT'S/FAMILY'S RESPONSE TO PLAN OF CARE: Patient daughter thanked csw for concern and support.     Bryce Gosselin, LCSW [phone]  ED CSW .08/25/2013 1636pm

## 2013-08-25 NOTE — Progress Notes (Signed)
HOME HEALTH AGENCIES SERVING [place]   Agencies that are Medicare-Certified and are affiliated with [facility]  Telephone Number Address  [facility]   [facility] has ownership interest in this company; however, you are under no obligation to use this agency. [phone] or  [phone] [address] [place], [place] [address] [email or web address]   Agencies that are Medicare-Certified and are not affiliated with [facility]                                                                                 Home Health Agency Telephone Number Address  [facility] [phone] Fax [phone] [address], [address] [place], [place]  [address] [email or web address]  [facility] [phone] or [phone] Fax [phone] [address] [place], [place] [address] [email or web address]  [facility] [phone] Fax [phone] [address] [place], [place] [address] [email or web address]  [facility] [phone] Fax [phone] [address], [address] [place], [place]  [address] [email or web address]  [facility] [phone] Fax [phone] [address] [place], [place] [address] [email or web address]  [facility] [phone] [address] [place], [place] [address] [email or web address]  [facility] [phone]  [address] [place], [place] [address] [email or web address]  [facility] [phone] or [phone] Fax number [phone] [address] [place], [place]  [address] [email or web address]  [facility] [phone] Fax [phone] [address] [place],   [address]  [facility]  [phone] Fax [phone] [address] [place],  [address] [email or web address]   

## 2013-08-25 NOTE — Progress Notes (Signed)
CSW received consult for potential dispositon needs. CSW spoke with EDP, who agreed with possible Home Health. Patient lives at home with wife with assistance from pt daughter. CSW spoke with RN CM to assist with home needs. Please consult CSW if further needs arise.   Catha Gosselin, LCSW [phone]  ED CSW .08/25/2013 1500pm

## 2013-08-25 NOTE — Progress Notes (Signed)
UR completed 

## 2013-08-25 NOTE — Progress Notes (Signed)
  Daughter stated pt [facility] with [facility] and last seen in November 2014 Daughter states pt unable to walk but is found after falls when attempting to get up or walk on his own Pt with PMH Dementia ED CM spoke with Dr Ardyth Harps at 1550 and Dr Jacky Kindle at 1605 Pt's daughter was given a list of private duty nursing agencies, an assisted living facility list and a skilled nursing facility list. Daughter voiced interest in "rehab" at "[facility]" Daughter discussed appreciation for resources offered to her   On call SW, Baxter Hire contacted and made aware of admission concerns. She contacted daughter to review out of pocket facility costs 1658 CM sent a text to [facility] transitional specialist [phone] requesting pt be followed for d/c needs on 08/26/13

## 2013-08-25 NOTE — Progress Notes (Signed)
   CARE MANAGEMENT ED NOTE 08/25/2013  Patient:  Bryce Potter, Bryce Potter   Account Number:  [id number]  Date Initiated:  08/25/2013  Documentation initiated by:  Edd Arbour  Subjective/Objective Assessment:   79 yr old male medicare pt with c/o multiple falls, weakness  Male at bedside reports pt stands without assistance She is requesting information on cost of snf placement out of pocket     Subjective/Objective Assessment Detail:   Male at bedside states pt has a RW, bedside commode, w/c  Male inquired if pt's cough would assist with getting him hospitalized  Pending choice of home health agency     Action/Plan:   CM spoke with ED SW who states EDP wanted home health services CM left [place] home health list with male at bedside Discussed medicare observation admission not qualifying criteria for snf  placement   Action/Plan Detail:   CM reviewed in details medicare guidelines, home health (HH) (length of stay in home, types of [facility] staff available, coverage, primary caregiver, up to 24 hrs before services may be started)  CM called ED SW to have her to speak with family   Anticipated DC Date:       Status Recommendation to Physician:   Result of Recommendation:    Other ED Services  Consult Working Plan    DC Planning Services  Other  PCP issues  Outpatient Services - Pt will follow up    Choice offered to / List presented to:            Status of service:  Completed, signed off  ED Comments:   ED Comments Detail:  CM reviewed in details medicare guidelines, home health (HH) (length of stay in home, types of [facility] staff available, coverage, primary caregiver, up to 24 hrs before services may be started), Private duty nursing (PDN-coverage, length of stay in the home types of staff available), assisted living (ASL- coverage, services offered) and Skilled nursing facilities (snf- coverage and services offered)  CM reviewed availability of HH SW to assist pcp to get  pt to snf (if desired disposition) from the community level. CM provided family with a list of [place] home health agencies, PDN, and snfs.

## 2013-08-26 DIAGNOSIS — E860 Dehydration: Secondary | ICD-10-CM | POA: Diagnosis not present

## 2013-08-26 DIAGNOSIS — R05 Cough: Secondary | ICD-10-CM | POA: Diagnosis not present

## 2013-08-26 DIAGNOSIS — N179 Acute kidney failure, unspecified: Secondary | ICD-10-CM | POA: Diagnosis not present

## 2013-08-26 LAB — INFLUENZA PANEL BY PCR (TYPE A & B)
Influenza A By PCR: NEGATIVE
Influenza B By PCR: NEGATIVE

## 2013-08-26 LAB — CBC
HCT: 35 % — ABNORMAL LOW (ref 39.0–52.0)
Hemoglobin: 11.7 g/dL — ABNORMAL LOW (ref 13.0–17.0)
MCH: 30.2 pg (ref 26.0–34.0)
MCV: 90.2 fL (ref 78.0–100.0)
Platelets: 158 10*3/uL (ref 150–400)
RDW: 13.2 % (ref 11.5–15.5)
WBC: 6.4 10*3/uL (ref 4.0–10.5)

## 2013-08-26 LAB — BASIC METABOLIC PANEL
BUN: 24 mg/dL — ABNORMAL HIGH (ref 6–23)
CO2: 22 mEq/L (ref 19–32)
Chloride: 103 mEq/L (ref 96–112)
Creatinine, Ser: 1.55 mg/dL — ABNORMAL HIGH (ref 0.50–1.35)
Glucose, Bld: 118 mg/dL — ABNORMAL HIGH (ref 70–99)

## 2013-08-26 LAB — RPR: RPR Ser Ql: NONREACTIVE

## 2013-08-26 MED ORDER — DM-GUAIFENESIN ER 30-600 MG PO TB12: 1.0000 | ORAL_TABLET | Freq: Two times a day (BID) | ORAL | Status: DC

## 2013-08-26 MED ORDER — DM-GUAIFENESIN ER 30-600 MG PO TB12
1.0000 | ORAL_TABLET | Freq: Two times a day (BID) | ORAL | Status: DC
  Administered 2013-08-26: 13:00:00 1 via ORAL
  Filled 2013-08-26 (×2): qty 1

## 2013-08-26 NOTE — Evaluation (Signed)
Physical Therapy Evaluation Patient Details Name: Bryce Potter MRN: [id number] DOB: 09-May-1919 Today's Date: 08/26/2013 Time: 2952-8413 PT Time Calculation (min): 26 min  PT Assessment / Plan / Recommendation History of Present Illness  Pt admitted with weakness and falls with wheezing and cough.  Pt has dementia at baseline.  Clinical Impression  Pt presenting with functional mobility limitations 2* generalized weakness, cognitive deficits and balance deficits.  Pt currently requires significant +2 assist for all mobility tasks and would greatly benefit from follow up rehab at SNF level to maximize IND and saftey prior to return home with dtr.    PT Assessment  Patient needs continued PT services    Follow Up Recommendations  SNF    Does the patient have the potential to tolerate intense rehabilitation      Barriers to Discharge Decreased caregiver support Dtr can not handle pt at current level of assist    Equipment Recommendations  None recommended by PT    Recommendations for Other Services OT consult   Frequency Min 3X/week    Precautions / Restrictions Precautions Precautions: Fall Restrictions Weight Bearing Restrictions: No   Pertinent Vitals/Pain No specific c/o pain      Mobility  Bed Mobility Bed Mobility: Supine to Sit;Sitting - Scoot to Edge of Bed;Sit to Supine Supine to Sit: 1: +2 Total assist;HOB elevated;With rails Supine to Sit: Patient Percentage: 30% Sitting - Scoot to Edge of Bed: 3: Mod assist Sit to Supine: 1: +2 Total assist Sit to Supine: Patient Percentage: 30% Details for Bed Mobility Assistance: assist with LEs and with trunk managment Transfers Transfers: Sit to Stand;Stand to Sit Sit to Stand: 1: +2 Total assist Sit to Stand: Patient Percentage: 50% Stand to Sit: 1: +2 Total assist;With upper extremity assist;To bed Stand to Sit: Patient Percentage: 50% Details for Transfer Assistance: verbal and physical cues for hand placement and  to set feet prior to standing Ambulation/Gait Ambulation/Gait Assistance: 1: +2 Total assist Ambulation Distance (Feet): 1 Feet Assistive device: Rolling walker Ambulation/Gait Assistance Details: side stepped up bed - multiple short steps - pt having difficulty advancing L LE - pt with difficult comprehending need for erect posture and WB on UEs Gait Pattern: Decreased step length - left;Decreased step length - right;Shuffle;Antalgic;Lateral trunk lean to right Stairs: No    Exercises     PT Diagnosis: Difficulty walking  PT Problem List: Decreased strength;Decreased range of motion;Decreased activity tolerance;Decreased balance;Decreased mobility;Decreased cognition;Decreased knowledge of use of DME;Obesity;Pain PT Treatment Interventions: DME instruction;Gait training;Stair training;Functional mobility training;Therapeutic activities;Therapeutic exercise;Patient/family education     PT Goals(Current goals can be found in the care plan section) Acute Rehab PT Goals Patient Stated Goal: get out of this bed PT Goal Formulation: With patient Time For Goal Achievement: 08/26/13 Potential to Achieve Goals: Good  Visit Information  Last PT Received On: 08/26/13 Assistance Needed: +2 PT/OT/SLP Co-Evaluation/Treatment: Yes Reason for Co-Treatment: For patient/therapist safety PT goals addressed during session: Mobility/safety with mobility;Proper use of DME OT goals addressed during session: ADL's and self-care History of Present Illness: Pt admitted with weakness and falls with wheezing and cough.  Pt has dementia at baseline.       Prior Functioning  Home Living Family/patient expects to be discharged to:: Skilled nursing facility Living Arrangements: Spouse/significant other;Children (daughters and grown nephew) Available Help at Discharge: Family;Available 24 hours/day Type of Home: House Home Access: Stairs to enter [employer] of Steps: 2 Entrance Stairs-Rails:  Right Home Layout: One level Home Equipment: Walker - 2  wheels;Shower seat;Hand held shower head;Cane - single point;Wheelchair - manual;Bedside commode Prior Function Level of Independence: Needs assistance Gait / Transfers Assistance Needed: ambulating with RW around house ADL's / Homemaking Assistance Needed: Nephew assisted with shower, daughter assisted with meals. Communication / Swallowing Assistance Needed: HOH Communication Communication: HOH Dominant Hand: Right    Cognition  Cognition Arousal/Alertness: Awake/alert Behavior During Therapy: WFL for tasks assessed/performed Overall Cognitive Status: History of cognitive impairments - at baseline Memory: Decreased short-term memory;Decreased recall of precautions    Extremity/Trunk Assessment Upper Extremity Assessment Upper Extremity Assessment: Defer to OT evaluation Lower Extremity Assessment Lower Extremity Assessment: Generalized weakness   Balance Balance Balance Assessed: Yes Static Sitting Balance Static Sitting - Balance Support: Feet supported Static Sitting - Level of Assistance: 5: Stand by assistance Static Standing Balance Static Standing - Balance Support: Bilateral upper extremity supported Static Standing - Level of Assistance: 1: +2 Total assist Static Standing - Comment/# of Minutes: 3  End of Session PT - End of Session Equipment Utilized During Treatment: Gait belt Activity Tolerance: Patient limited by fatigue Patient left: in bed;with call bell/phone within reach;with family/visitor present Nurse Communication: Mobility status  GP Functional Assessment Tool Used: clinical judgement Functional Limitation: Mobility: Walking and moving around Mobility: Walking and Moving Around Current Status [phone]): At least 60 percent but less than 80 percent impaired, limited or restricted Mobility: Walking and Moving Around Goal Status [phone]): At least 20 percent but less than 40 percent impaired, limited or  restricted   Jami Bogdanski 08/26/2013, 1:18 PM

## 2013-08-26 NOTE — Discharge Summary (Signed)
Physician Discharge Summary  Abdelaziz Westenberger ZOX:[id number] DOB: 06/08/1919 DOA: 08/25/2013  PCP: Herb Grays, MD  Admit date: 08/25/2013 Discharge date: 08/26/2013  Time spent: 45 minutes  Recommendations for Outpatient Follow-up:  -Will be discharged to SNF today.   Discharge Diagnoses:  Active Problems:   Weakness generalized   FTT (failure to thrive) in adult   ARF (acute renal failure)   CKD (chronic kidney disease), stage III   Cough   Hypothyroidism   Discharge Condition: Stable and improved  Filed Weights   08/25/13 1710  Weight: 93.6 kg (206 lb 5.6 oz)    History of present illness:  Pleasant 79 y/o man with PMH significant for hypothyroidism, CKD. He presents to the hospital with falls that have been occuring over the past 3 weeks. Last night he started having a cough, no sputum production. No fevers or chills. No sick contacts or recent travel. The daughter, Magda Paganini, brings him in today seeking placement as she is no longer able to care for him at home. Workup in the ED shows a normal Head CT, CXR without acute cardiopulmonary disease, labs are essentially normal with the exception of a Cr of 1.64 (was 1.32 in 3/10, which is the last measurement on record). We were asked to evaluate him for potential admission.   Hospital Course:   Generalized Weakness/FTT -Etiology remains unclear. -B12 WNL at 710. -Daughter is requesting SNF admission which will hopefully be accomplished today.  CKD Stage III with ARF -Back to baseline of 1.3-1.5 with IVF. -Cr was 1.8 on admission.  Cough -Likely just an URI. -Flu PCR negative. -No PNA on CXR. -Continue symptomatic treatment.  Hypothryroidism -Continue synthroid.  Procedures:  None   Consultations:  None  Discharge Instructions  Discharge Orders   Future Orders Complete By Expires   Diet - low sodium heart healthy  As directed    Discontinue IV  As directed    Increase activity slowly  As directed         Medication List         acetaminophen-codeine 300-30 MG per tablet  Commonly known as:  TYLENOL #3  Take 1 tablet by mouth every 4 (four) hours as needed for moderate pain.     aspirin EC 81 MG tablet  Take 81 mg by mouth daily.     dextromethorphan-guaiFENesin 30-600 MG per 12 hr tablet  Commonly known as:  MUCINEX DM  Take 1 tablet by mouth 2 (two) times daily.     donepezil 5 MG tablet  Commonly known as:  ARICEPT  Take 5 mg by mouth at bedtime.     levothyroxine 75 MCG tablet  Commonly known as:  SYNTHROID, LEVOTHROID  Take 75 mcg by mouth daily before breakfast.     metoprolol tartrate 25 MG tablet  Commonly known as:  LOPRESSOR  Take 25 mg by mouth 2 (two) times daily.     omega-3 acid ethyl esters 1 G capsule  Commonly known as:  LOVAZA  Take 1 g by mouth daily.     omeprazole 20 MG capsule  Commonly known as:  PRILOSEC  Take 20 mg by mouth daily.     tamsulosin 0.4 MG Caps capsule  Commonly known as:  FLOMAX  Take 0.4 mg by mouth daily after supper.       No Known Allergies     Follow-up Information   Follow up with Herb Grays, MD. Schedule an appointment as soon as possible for a visit in 2  weeks.   Specialty:  Family Medicine   Contact information:   [address] [place] [address] [phone]        The results of significant diagnostics from this hospitalization (including imaging, microbiology, ancillary and laboratory) are listed below for reference.    Significant Diagnostic Studies: Dg Chest 2 View  08/25/2013   CLINICAL DATA:  Shortness of breath and cough, history of hypertension  EXAM: CHEST  2 VIEW  COMPARISON:  11/16/2008; 11/09/2008  FINDINGS: Grossly unchanged enlarged cardiac silhouette and mediastinal contours. Evaluation of the retrosternal clear space is obscured secondary to overlying soft tissues. Minimal bibasilar opacities favored to represent atelectasis. No focal airspace opacities.  No pleural effusion or pneumothorax. No definite evidence of edema. No acute osseus abnormalities.  IMPRESSION: Minimal bibasilar atelectasis without definite acute cardiopulmonary disease.   Electronically Signed   By: Simonne Come M.D.   On: 08/25/2013 14:10   Dg Pelvis 1-2 Views  08/25/2013   CLINICAL DATA:  Pain in the lower back, legs, and hips.  EXAM: PELVIS - 1-2 VIEW  COMPARISON:  None.  FINDINGS: There is no evidence of pelvic fracture or diastasis. No other pelvic bone lesions are seen. Mild degenerative change lower lumbar spine.  IMPRESSION: No pelvic lesions are evident.   Electronically Signed   By: Davonna Belling M.D.   On: 08/25/2013 14:14   Ct Head Wo Contrast  08/25/2013   CLINICAL DATA:  Multiple falls.  EXAM: CT HEAD WITHOUT CONTRAST  TECHNIQUE: Contiguous axial images were obtained from the base of the skull through the vertex without intravenous contrast.  COMPARISON:  None.  FINDINGS: Mucosal thickening is seen in both maxillary sinuses. Bony calvarium appears intact. Diffuse cortical atrophy is noted. Chronic ischemic white matter disease is noted. No mass effect or midline shift is noted. Ventricular size is within normal limits given the degree of atrophy present. There is no evidence of mass lesion, hemorrhage or acute infarction. 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  IMPRESSION: Diffuse cortical atrophy. Chronic ischemic white matter disease. No acute intracranial abnormality seen.   Electronically Signed   By: Roque Lias M.D.   On: 08/25/2013 13:55    Microbiology: No results found for this or any previous visit (from the past 240 hour(s)).   Labs: Basic Metabolic Panel:  Recent Labs Lab 08/25/13 1411 08/25/13 1421 08/26/13 0450  NA 139 141 138  K 4.5 4.5 4.4  CL 106 108 103  CO2 19  --  22  GLUCOSE 132* 132* 118*  BUN 26* 31* 24*  CREATININE 1.64* 1.80* 1.55*  CALCIUM 9.0  --  8.8   Liver Function Tests:  Recent  Labs Lab 08/25/13 1411  AST 45*  ALT 28  ALKPHOS 50  BILITOT 0.3  PROT 7.7  ALBUMIN 3.1*   No results found for this basename: LIPASE, AMYLASE,  in the last 168 hours No results found for this basename: AMMONIA,  in the last 168 hours CBC:  Recent Labs Lab 08/25/13 1315 08/25/13 1421 08/26/13 0450  WBC 6.7  --  6.4  NEUTROABS 3.2  --   --   HGB 12.3* 12.9* 11.7*  HCT 36.6* 38.0* 35.0*  MCV 90.1  --  90.2  PLT 129*  --  158   Cardiac Enzymes: No results found for this basename: CKTOTAL, CKMB, CKMBINDEX, TROPONINI,  in the last 168 hours BNP: BNP (last 3 results) No  results found for this basename: PROBNP,  in the last 8760 hours CBG: No results found for this basename: GLUCAP,  in the last 168 hours     Signed:  Chaya Jan  [facility] Pager: [phone] 08/26/2013, 11:49 AM

## 2013-08-26 NOTE — Progress Notes (Signed)
Met with Pt's family at bedside.  After a lengthy conversation, Pt's family decided to take Pt home and they will pursue [facility] on Monday.    RNCM aware and will arrange HH.  No further needs.  Pt to d/c today.  Providence Crosby, LCSWA Clinical Social Work [phone]

## 2013-08-26 NOTE — Progress Notes (Signed)
Contacted [facility] to make aware of dc home today with HH. Dtr states no DME is needed. Isidoro Donning RN CCM Case Mgmt phone [phone]

## 2013-08-26 NOTE — Evaluation (Signed)
Occupational Therapy Evaluation Patient Details Name: Bryce Potter MRN: [id number] DOB: 1919/02/12 Today's Date: 08/26/2013 Time: 4098-1191 OT Time Calculation (min): 46 min  OT Assessment / Plan / Recommendation History of present illness Pt admitted with weakness and falls with wheezing and cough.  Pt has dementia at baseline.   Clinical Impression   Pt currently requires 2 person assist for all mobility and was unable to transfer to chair due to inability to pick L LE off the floor.  He is dependent in bathing, dressing, and toileting.  Pt will require SNF for short term rehab prior to return home with his family.  Will plan to defer OT to SNF.    OT Assessment  All further OT needs can be met in the next venue of care    Follow Up Recommendations  SNF    Barriers to Discharge      Equipment Recommendations       Recommendations for Other Services    Frequency       Precautions / Restrictions Precautions Precautions: Fall Restrictions Weight Bearing Restrictions: No   Pertinent Vitals/Pain VSS, no c/o pain    ADL  Eating/Feeding: Supervision/safety Where Assessed - Eating/Feeding: Bed level Grooming: Wash/dry hands;Wash/dry face;Supervision/safety Where Assessed - Grooming: Supine, head of bed up Upper Body Bathing: Moderate assistance Where Assessed - Upper Body Bathing: Unsupported sitting Lower Body Bathing: +1 Total assistance Where Assessed - Lower Body Bathing: Unsupported sitting;Supported sit to stand Upper Body Dressing: Moderate assistance Where Assessed - Upper Body Dressing: Unsupported sitting Lower Body Dressing: +1 Total assistance Where Assessed - Lower Body Dressing: Unsupported sitting;Supported sit to stand Equipment Used: Rolling walker;Gait belt Transfers/Ambulation Related to ADLs: Pt stood and took 3 steps to [facility] with RW.  Unable to clear L foot from floor.  Pt states his R LE "gives way" at home.    OT Diagnosis: Generalized  weakness;Cognitive deficits  OT Problem List: Decreased strength;Impaired balance (sitting and/or standing);Decreased cognition;Decreased knowledge of use of DME or AE OT Treatment Interventions:     OT Goals(Current goals can be found in the care plan section) Acute Rehab OT Goals Patient Stated Goal: get out of this bed  Visit Information  Last OT Received On: 08/26/13 Assistance Needed: +2 PT/OT/SLP Co-Evaluation/Treatment: Yes Reason for Co-Treatment: For patient/therapist safety OT goals addressed during session: ADL's and self-care History of Present Illness: Pt admitted with weakness and falls with wheezing and cough.  Pt has dementia at baseline.       Prior Functioning     Home Living Family/patient expects to be discharged to:: Private residence Living Arrangements: Spouse/significant other;Children (daughters and grown nephew) Available Help at Discharge: Family;Available 24 hours/day Type of Home: House Home Access: Stairs to enter [employer] of Steps: 2 Entrance Stairs-Rails: Right Home Layout: One level Home Equipment: Walker - 2 wheels;Shower seat;Hand held shower head;Cane - single point;Wheelchair - manual;Bedside commode Prior Function Level of Independence: Needs assistance Gait / Transfers Assistance Needed: ambulating with RW around house ADL's / Homemaking Assistance Needed: Nephew assisted with shower, daughter assisted with meals. Communication / Swallowing Assistance Needed: HOH Communication Communication: HOH Dominant Hand: Right         Vision/Perception Vision - History Baseline Vision: No visual deficits Patient Visual Report: No change from baseline   Cognition  Cognition Arousal/Alertness: Awake/alert Behavior During Therapy: WFL for tasks assessed/performed Overall Cognitive Status: History of cognitive impairments - at baseline Memory: Decreased short-term memory;Decreased recall of precautions    Extremity/Trunk  Assessment  Upper Extremity Assessment Upper Extremity Assessment: Overall WFL for tasks assessed Lower Extremity Assessment Lower Extremity Assessment: Defer to PT evaluation     Mobility Bed Mobility Bed Mobility: Supine to Sit;Sitting - Scoot to Edge of Bed Supine to Sit: 1: +2 Total assist;HOB elevated;With rails Supine to Sit: Patient Percentage: 30% Sitting - Scoot to Edge of Bed: 3: Mod assist Transfers Transfers: Sit to Stand;Stand to Sit Sit to Stand: 1: +2 Total assist;With upper extremity assist;From bed Sit to Stand: Patient Percentage: 50% Stand to Sit: 1: +2 Total assist;With upper extremity assist;To bed Stand to Sit: Patient Percentage: 50% Details for Transfer Assistance: verbal and physical cues for hand placement and to set feet prior to standing     Exercise     Balance Balance Balance Assessed: Yes Static Sitting Balance Static Sitting - Balance Support: Feet supported Static Sitting - Level of Assistance: 5: Stand by assistance   End of Session OT - End of Session Equipment Utilized During Treatment: Rolling walker Activity Tolerance: Patient tolerated treatment well Patient left: in bed;with call bell/phone within reach;with bed alarm set;with family/visitor present Nurse Communication: Mobility status  GO Functional Assessment Tool Used: clinical judgement Functional Limitation: Self care Self Care Current Status (Y7829): At least 60 percent but less than 80 percent impaired, limited or restricted Self Care Goal Status (F6213): At least 80 percent but less than 100 percent impaired, limited or restricted Self Care Discharge Status [phone]): At least 80 percent but less than 100 percent impaired, limited or restricted   Evern Bio 08/26/2013, 12:31 PM [phone]

## 2013-08-27 DIAGNOSIS — R627 Adult failure to thrive: Secondary | ICD-10-CM | POA: Diagnosis not present

## 2013-08-27 DIAGNOSIS — Z9181 History of falling: Secondary | ICD-10-CM | POA: Diagnosis not present

## 2013-08-27 DIAGNOSIS — M6281 Muscle weakness (generalized): Secondary | ICD-10-CM | POA: Diagnosis not present

## 2013-08-27 DIAGNOSIS — Z8673 Personal history of transient ischemic attack (TIA), and cerebral infarction without residual deficits: Secondary | ICD-10-CM | POA: Diagnosis not present

## 2013-08-27 DIAGNOSIS — R262 Difficulty in walking, not elsewhere classified: Secondary | ICD-10-CM | POA: Diagnosis not present

## 2013-08-27 DIAGNOSIS — F0390 Unspecified dementia without behavioral disturbance: Secondary | ICD-10-CM | POA: Diagnosis not present

## 2013-08-27 DIAGNOSIS — I129 Hypertensive chronic kidney disease with stage 1 through stage 4 chronic kidney disease, or unspecified chronic kidney disease: Secondary | ICD-10-CM | POA: Diagnosis not present

## 2013-08-27 DIAGNOSIS — E860 Dehydration: Secondary | ICD-10-CM | POA: Diagnosis not present

## 2013-08-29 DIAGNOSIS — M6281 Muscle weakness (generalized): Secondary | ICD-10-CM | POA: Diagnosis not present

## 2013-08-29 DIAGNOSIS — I129 Hypertensive chronic kidney disease with stage 1 through stage 4 chronic kidney disease, or unspecified chronic kidney disease: Secondary | ICD-10-CM | POA: Diagnosis not present

## 2013-08-29 DIAGNOSIS — R627 Adult failure to thrive: Secondary | ICD-10-CM | POA: Diagnosis not present

## 2013-08-29 DIAGNOSIS — E860 Dehydration: Secondary | ICD-10-CM | POA: Diagnosis not present

## 2013-08-29 DIAGNOSIS — F0390 Unspecified dementia without behavioral disturbance: Secondary | ICD-10-CM | POA: Diagnosis not present

## 2013-08-29 DIAGNOSIS — R262 Difficulty in walking, not elsewhere classified: Secondary | ICD-10-CM | POA: Diagnosis not present

## 2013-09-01 DIAGNOSIS — M6281 Muscle weakness (generalized): Secondary | ICD-10-CM | POA: Diagnosis not present

## 2013-09-01 DIAGNOSIS — F0390 Unspecified dementia without behavioral disturbance: Secondary | ICD-10-CM | POA: Diagnosis not present

## 2013-09-01 DIAGNOSIS — I129 Hypertensive chronic kidney disease with stage 1 through stage 4 chronic kidney disease, or unspecified chronic kidney disease: Secondary | ICD-10-CM | POA: Diagnosis not present

## 2013-09-01 DIAGNOSIS — E860 Dehydration: Secondary | ICD-10-CM | POA: Diagnosis not present

## 2013-09-01 DIAGNOSIS — R627 Adult failure to thrive: Secondary | ICD-10-CM | POA: Diagnosis not present

## 2013-09-01 DIAGNOSIS — R262 Difficulty in walking, not elsewhere classified: Secondary | ICD-10-CM | POA: Diagnosis not present

## 2013-09-04 DIAGNOSIS — E860 Dehydration: Secondary | ICD-10-CM | POA: Diagnosis not present

## 2013-09-04 DIAGNOSIS — M6281 Muscle weakness (generalized): Secondary | ICD-10-CM | POA: Diagnosis not present

## 2013-09-04 DIAGNOSIS — I129 Hypertensive chronic kidney disease with stage 1 through stage 4 chronic kidney disease, or unspecified chronic kidney disease: Secondary | ICD-10-CM | POA: Diagnosis not present

## 2013-09-04 DIAGNOSIS — F0390 Unspecified dementia without behavioral disturbance: Secondary | ICD-10-CM | POA: Diagnosis not present

## 2013-09-04 DIAGNOSIS — R262 Difficulty in walking, not elsewhere classified: Secondary | ICD-10-CM | POA: Diagnosis not present

## 2013-09-04 DIAGNOSIS — R627 Adult failure to thrive: Secondary | ICD-10-CM | POA: Diagnosis not present

## 2013-09-05 DIAGNOSIS — M6281 Muscle weakness (generalized): Secondary | ICD-10-CM | POA: Diagnosis not present

## 2013-09-05 DIAGNOSIS — R262 Difficulty in walking, not elsewhere classified: Secondary | ICD-10-CM | POA: Diagnosis not present

## 2013-09-05 DIAGNOSIS — I129 Hypertensive chronic kidney disease with stage 1 through stage 4 chronic kidney disease, or unspecified chronic kidney disease: Secondary | ICD-10-CM | POA: Diagnosis not present

## 2013-09-05 DIAGNOSIS — F0390 Unspecified dementia without behavioral disturbance: Secondary | ICD-10-CM | POA: Diagnosis not present

## 2013-09-05 DIAGNOSIS — E860 Dehydration: Secondary | ICD-10-CM | POA: Diagnosis not present

## 2013-09-05 DIAGNOSIS — R627 Adult failure to thrive: Secondary | ICD-10-CM | POA: Diagnosis not present

## 2013-09-06 DIAGNOSIS — R262 Difficulty in walking, not elsewhere classified: Secondary | ICD-10-CM | POA: Diagnosis not present

## 2013-09-06 DIAGNOSIS — F0390 Unspecified dementia without behavioral disturbance: Secondary | ICD-10-CM | POA: Diagnosis not present

## 2013-09-06 DIAGNOSIS — I129 Hypertensive chronic kidney disease with stage 1 through stage 4 chronic kidney disease, or unspecified chronic kidney disease: Secondary | ICD-10-CM | POA: Diagnosis not present

## 2013-09-06 DIAGNOSIS — M6281 Muscle weakness (generalized): Secondary | ICD-10-CM | POA: Diagnosis not present

## 2013-09-06 DIAGNOSIS — R627 Adult failure to thrive: Secondary | ICD-10-CM | POA: Diagnosis not present

## 2013-09-06 DIAGNOSIS — E860 Dehydration: Secondary | ICD-10-CM | POA: Diagnosis not present

## 2013-09-07 DIAGNOSIS — E860 Dehydration: Secondary | ICD-10-CM | POA: Diagnosis not present

## 2013-09-07 DIAGNOSIS — R627 Adult failure to thrive: Secondary | ICD-10-CM | POA: Diagnosis not present

## 2013-09-07 DIAGNOSIS — I129 Hypertensive chronic kidney disease with stage 1 through stage 4 chronic kidney disease, or unspecified chronic kidney disease: Secondary | ICD-10-CM | POA: Diagnosis not present

## 2013-09-07 DIAGNOSIS — R262 Difficulty in walking, not elsewhere classified: Secondary | ICD-10-CM | POA: Diagnosis not present

## 2013-09-07 DIAGNOSIS — M6281 Muscle weakness (generalized): Secondary | ICD-10-CM | POA: Diagnosis not present

## 2013-09-07 DIAGNOSIS — F0390 Unspecified dementia without behavioral disturbance: Secondary | ICD-10-CM | POA: Diagnosis not present

## 2013-09-08 DIAGNOSIS — M6281 Muscle weakness (generalized): Secondary | ICD-10-CM | POA: Diagnosis not present

## 2013-09-08 DIAGNOSIS — R627 Adult failure to thrive: Secondary | ICD-10-CM | POA: Diagnosis not present

## 2013-09-08 DIAGNOSIS — I129 Hypertensive chronic kidney disease with stage 1 through stage 4 chronic kidney disease, or unspecified chronic kidney disease: Secondary | ICD-10-CM | POA: Diagnosis not present

## 2013-09-08 DIAGNOSIS — R262 Difficulty in walking, not elsewhere classified: Secondary | ICD-10-CM | POA: Diagnosis not present

## 2013-09-08 DIAGNOSIS — E860 Dehydration: Secondary | ICD-10-CM | POA: Diagnosis not present

## 2013-09-08 DIAGNOSIS — F0390 Unspecified dementia without behavioral disturbance: Secondary | ICD-10-CM | POA: Diagnosis not present

## 2013-09-09 DIAGNOSIS — E860 Dehydration: Secondary | ICD-10-CM | POA: Diagnosis not present

## 2013-09-09 DIAGNOSIS — M6281 Muscle weakness (generalized): Secondary | ICD-10-CM | POA: Diagnosis not present

## 2013-09-09 DIAGNOSIS — I129 Hypertensive chronic kidney disease with stage 1 through stage 4 chronic kidney disease, or unspecified chronic kidney disease: Secondary | ICD-10-CM | POA: Diagnosis not present

## 2013-09-09 DIAGNOSIS — F0390 Unspecified dementia without behavioral disturbance: Secondary | ICD-10-CM | POA: Diagnosis not present

## 2013-09-09 DIAGNOSIS — R262 Difficulty in walking, not elsewhere classified: Secondary | ICD-10-CM | POA: Diagnosis not present

## 2013-09-09 DIAGNOSIS — R627 Adult failure to thrive: Secondary | ICD-10-CM | POA: Diagnosis not present

## 2013-09-11 DIAGNOSIS — R262 Difficulty in walking, not elsewhere classified: Secondary | ICD-10-CM | POA: Diagnosis not present

## 2013-09-11 DIAGNOSIS — R627 Adult failure to thrive: Secondary | ICD-10-CM | POA: Diagnosis not present

## 2013-09-11 DIAGNOSIS — F0390 Unspecified dementia without behavioral disturbance: Secondary | ICD-10-CM | POA: Diagnosis not present

## 2013-09-11 DIAGNOSIS — E860 Dehydration: Secondary | ICD-10-CM | POA: Diagnosis not present

## 2013-09-11 DIAGNOSIS — I129 Hypertensive chronic kidney disease with stage 1 through stage 4 chronic kidney disease, or unspecified chronic kidney disease: Secondary | ICD-10-CM | POA: Diagnosis not present

## 2013-09-11 DIAGNOSIS — M6281 Muscle weakness (generalized): Secondary | ICD-10-CM | POA: Diagnosis not present

## 2013-09-12 DIAGNOSIS — F0390 Unspecified dementia without behavioral disturbance: Secondary | ICD-10-CM | POA: Diagnosis not present

## 2013-09-12 DIAGNOSIS — R627 Adult failure to thrive: Secondary | ICD-10-CM | POA: Diagnosis not present

## 2013-09-12 DIAGNOSIS — E860 Dehydration: Secondary | ICD-10-CM | POA: Diagnosis not present

## 2013-09-12 DIAGNOSIS — R262 Difficulty in walking, not elsewhere classified: Secondary | ICD-10-CM | POA: Diagnosis not present

## 2013-09-12 DIAGNOSIS — I129 Hypertensive chronic kidney disease with stage 1 through stage 4 chronic kidney disease, or unspecified chronic kidney disease: Secondary | ICD-10-CM | POA: Diagnosis not present

## 2013-09-12 DIAGNOSIS — M6281 Muscle weakness (generalized): Secondary | ICD-10-CM | POA: Diagnosis not present

## 2013-09-13 DIAGNOSIS — R627 Adult failure to thrive: Secondary | ICD-10-CM | POA: Diagnosis not present

## 2013-09-13 DIAGNOSIS — M6281 Muscle weakness (generalized): Secondary | ICD-10-CM | POA: Diagnosis not present

## 2013-09-13 DIAGNOSIS — R262 Difficulty in walking, not elsewhere classified: Secondary | ICD-10-CM | POA: Diagnosis not present

## 2013-09-13 DIAGNOSIS — F0390 Unspecified dementia without behavioral disturbance: Secondary | ICD-10-CM | POA: Diagnosis not present

## 2013-09-13 DIAGNOSIS — E860 Dehydration: Secondary | ICD-10-CM | POA: Diagnosis not present

## 2013-09-13 DIAGNOSIS — I129 Hypertensive chronic kidney disease with stage 1 through stage 4 chronic kidney disease, or unspecified chronic kidney disease: Secondary | ICD-10-CM | POA: Diagnosis not present

## 2013-09-14 DIAGNOSIS — F0390 Unspecified dementia without behavioral disturbance: Secondary | ICD-10-CM | POA: Diagnosis not present

## 2013-09-14 DIAGNOSIS — R627 Adult failure to thrive: Secondary | ICD-10-CM | POA: Diagnosis not present

## 2013-09-14 DIAGNOSIS — M6281 Muscle weakness (generalized): Secondary | ICD-10-CM | POA: Diagnosis not present

## 2013-09-14 DIAGNOSIS — I129 Hypertensive chronic kidney disease with stage 1 through stage 4 chronic kidney disease, or unspecified chronic kidney disease: Secondary | ICD-10-CM | POA: Diagnosis not present

## 2013-09-14 DIAGNOSIS — R262 Difficulty in walking, not elsewhere classified: Secondary | ICD-10-CM | POA: Diagnosis not present

## 2013-09-14 DIAGNOSIS — E860 Dehydration: Secondary | ICD-10-CM | POA: Diagnosis not present

## 2013-09-18 DIAGNOSIS — R627 Adult failure to thrive: Secondary | ICD-10-CM | POA: Diagnosis not present

## 2013-09-18 DIAGNOSIS — I129 Hypertensive chronic kidney disease with stage 1 through stage 4 chronic kidney disease, or unspecified chronic kidney disease: Secondary | ICD-10-CM | POA: Diagnosis not present

## 2013-09-18 DIAGNOSIS — E860 Dehydration: Secondary | ICD-10-CM | POA: Diagnosis not present

## 2013-09-18 DIAGNOSIS — R262 Difficulty in walking, not elsewhere classified: Secondary | ICD-10-CM | POA: Diagnosis not present

## 2013-09-18 DIAGNOSIS — M6281 Muscle weakness (generalized): Secondary | ICD-10-CM | POA: Diagnosis not present

## 2013-09-18 DIAGNOSIS — F0390 Unspecified dementia without behavioral disturbance: Secondary | ICD-10-CM | POA: Diagnosis not present

## 2013-09-20 DIAGNOSIS — E860 Dehydration: Secondary | ICD-10-CM | POA: Diagnosis not present

## 2013-09-20 DIAGNOSIS — F0390 Unspecified dementia without behavioral disturbance: Secondary | ICD-10-CM | POA: Diagnosis not present

## 2013-09-20 DIAGNOSIS — R262 Difficulty in walking, not elsewhere classified: Secondary | ICD-10-CM | POA: Diagnosis not present

## 2013-09-20 DIAGNOSIS — I129 Hypertensive chronic kidney disease with stage 1 through stage 4 chronic kidney disease, or unspecified chronic kidney disease: Secondary | ICD-10-CM | POA: Diagnosis not present

## 2013-09-20 DIAGNOSIS — M6281 Muscle weakness (generalized): Secondary | ICD-10-CM | POA: Diagnosis not present

## 2013-09-20 DIAGNOSIS — R627 Adult failure to thrive: Secondary | ICD-10-CM | POA: Diagnosis not present

## 2013-09-21 DIAGNOSIS — R262 Difficulty in walking, not elsewhere classified: Secondary | ICD-10-CM | POA: Diagnosis not present

## 2013-09-21 DIAGNOSIS — I129 Hypertensive chronic kidney disease with stage 1 through stage 4 chronic kidney disease, or unspecified chronic kidney disease: Secondary | ICD-10-CM | POA: Diagnosis not present

## 2013-09-21 DIAGNOSIS — E860 Dehydration: Secondary | ICD-10-CM | POA: Diagnosis not present

## 2013-09-21 DIAGNOSIS — F0390 Unspecified dementia without behavioral disturbance: Secondary | ICD-10-CM | POA: Diagnosis not present

## 2013-09-21 DIAGNOSIS — R627 Adult failure to thrive: Secondary | ICD-10-CM | POA: Diagnosis not present

## 2013-09-21 DIAGNOSIS — M6281 Muscle weakness (generalized): Secondary | ICD-10-CM | POA: Diagnosis not present

## 2013-09-22 DIAGNOSIS — R262 Difficulty in walking, not elsewhere classified: Secondary | ICD-10-CM | POA: Diagnosis not present

## 2013-09-22 DIAGNOSIS — I129 Hypertensive chronic kidney disease with stage 1 through stage 4 chronic kidney disease, or unspecified chronic kidney disease: Secondary | ICD-10-CM | POA: Diagnosis not present

## 2013-09-22 DIAGNOSIS — M6281 Muscle weakness (generalized): Secondary | ICD-10-CM | POA: Diagnosis not present

## 2013-09-22 DIAGNOSIS — E860 Dehydration: Secondary | ICD-10-CM | POA: Diagnosis not present

## 2013-09-22 DIAGNOSIS — R627 Adult failure to thrive: Secondary | ICD-10-CM | POA: Diagnosis not present

## 2013-09-22 DIAGNOSIS — F0390 Unspecified dementia without behavioral disturbance: Secondary | ICD-10-CM | POA: Diagnosis not present

## 2013-09-25 DIAGNOSIS — F0390 Unspecified dementia without behavioral disturbance: Secondary | ICD-10-CM | POA: Diagnosis not present

## 2013-09-25 DIAGNOSIS — M6281 Muscle weakness (generalized): Secondary | ICD-10-CM | POA: Diagnosis not present

## 2013-09-25 DIAGNOSIS — I129 Hypertensive chronic kidney disease with stage 1 through stage 4 chronic kidney disease, or unspecified chronic kidney disease: Secondary | ICD-10-CM | POA: Diagnosis not present

## 2013-09-25 DIAGNOSIS — R627 Adult failure to thrive: Secondary | ICD-10-CM | POA: Diagnosis not present

## 2013-09-25 DIAGNOSIS — R262 Difficulty in walking, not elsewhere classified: Secondary | ICD-10-CM | POA: Diagnosis not present

## 2013-09-25 DIAGNOSIS — E860 Dehydration: Secondary | ICD-10-CM | POA: Diagnosis not present

## 2013-09-26 DIAGNOSIS — R262 Difficulty in walking, not elsewhere classified: Secondary | ICD-10-CM | POA: Diagnosis not present

## 2013-09-26 DIAGNOSIS — E860 Dehydration: Secondary | ICD-10-CM | POA: Diagnosis not present

## 2013-09-26 DIAGNOSIS — M6281 Muscle weakness (generalized): Secondary | ICD-10-CM | POA: Diagnosis not present

## 2013-09-26 DIAGNOSIS — F0390 Unspecified dementia without behavioral disturbance: Secondary | ICD-10-CM | POA: Diagnosis not present

## 2013-09-26 DIAGNOSIS — I129 Hypertensive chronic kidney disease with stage 1 through stage 4 chronic kidney disease, or unspecified chronic kidney disease: Secondary | ICD-10-CM | POA: Diagnosis not present

## 2013-09-26 DIAGNOSIS — R627 Adult failure to thrive: Secondary | ICD-10-CM | POA: Diagnosis not present

## 2013-09-27 DIAGNOSIS — F0390 Unspecified dementia without behavioral disturbance: Secondary | ICD-10-CM | POA: Diagnosis not present

## 2013-09-27 DIAGNOSIS — R627 Adult failure to thrive: Secondary | ICD-10-CM | POA: Diagnosis not present

## 2013-09-27 DIAGNOSIS — I129 Hypertensive chronic kidney disease with stage 1 through stage 4 chronic kidney disease, or unspecified chronic kidney disease: Secondary | ICD-10-CM | POA: Diagnosis not present

## 2013-09-27 DIAGNOSIS — R262 Difficulty in walking, not elsewhere classified: Secondary | ICD-10-CM | POA: Diagnosis not present

## 2013-09-27 DIAGNOSIS — E860 Dehydration: Secondary | ICD-10-CM | POA: Diagnosis not present

## 2013-09-27 DIAGNOSIS — M6281 Muscle weakness (generalized): Secondary | ICD-10-CM | POA: Diagnosis not present

## 2013-09-28 DIAGNOSIS — R262 Difficulty in walking, not elsewhere classified: Secondary | ICD-10-CM | POA: Diagnosis not present

## 2013-09-28 DIAGNOSIS — M6281 Muscle weakness (generalized): Secondary | ICD-10-CM | POA: Diagnosis not present

## 2013-09-28 DIAGNOSIS — R627 Adult failure to thrive: Secondary | ICD-10-CM | POA: Diagnosis not present

## 2013-09-28 DIAGNOSIS — F0390 Unspecified dementia without behavioral disturbance: Secondary | ICD-10-CM | POA: Diagnosis not present

## 2013-09-28 DIAGNOSIS — I129 Hypertensive chronic kidney disease with stage 1 through stage 4 chronic kidney disease, or unspecified chronic kidney disease: Secondary | ICD-10-CM | POA: Diagnosis not present

## 2013-09-28 DIAGNOSIS — E860 Dehydration: Secondary | ICD-10-CM | POA: Diagnosis not present

## 2013-10-03 DIAGNOSIS — I129 Hypertensive chronic kidney disease with stage 1 through stage 4 chronic kidney disease, or unspecified chronic kidney disease: Secondary | ICD-10-CM | POA: Diagnosis not present

## 2013-10-03 DIAGNOSIS — R262 Difficulty in walking, not elsewhere classified: Secondary | ICD-10-CM | POA: Diagnosis not present

## 2013-10-03 DIAGNOSIS — M6281 Muscle weakness (generalized): Secondary | ICD-10-CM | POA: Diagnosis not present

## 2013-10-03 DIAGNOSIS — R627 Adult failure to thrive: Secondary | ICD-10-CM | POA: Diagnosis not present

## 2013-10-03 DIAGNOSIS — F0390 Unspecified dementia without behavioral disturbance: Secondary | ICD-10-CM | POA: Diagnosis not present

## 2013-10-03 DIAGNOSIS — E860 Dehydration: Secondary | ICD-10-CM | POA: Diagnosis not present

## 2013-10-04 DIAGNOSIS — R627 Adult failure to thrive: Secondary | ICD-10-CM | POA: Diagnosis not present

## 2013-10-04 DIAGNOSIS — R262 Difficulty in walking, not elsewhere classified: Secondary | ICD-10-CM | POA: Diagnosis not present

## 2013-10-04 DIAGNOSIS — I129 Hypertensive chronic kidney disease with stage 1 through stage 4 chronic kidney disease, or unspecified chronic kidney disease: Secondary | ICD-10-CM | POA: Diagnosis not present

## 2013-10-04 DIAGNOSIS — M6281 Muscle weakness (generalized): Secondary | ICD-10-CM | POA: Diagnosis not present

## 2013-10-04 DIAGNOSIS — E860 Dehydration: Secondary | ICD-10-CM | POA: Diagnosis not present

## 2013-10-04 DIAGNOSIS — F0390 Unspecified dementia without behavioral disturbance: Secondary | ICD-10-CM | POA: Diagnosis not present

## 2013-10-09 DIAGNOSIS — F0390 Unspecified dementia without behavioral disturbance: Secondary | ICD-10-CM | POA: Diagnosis not present

## 2013-10-09 DIAGNOSIS — R262 Difficulty in walking, not elsewhere classified: Secondary | ICD-10-CM | POA: Diagnosis not present

## 2013-10-09 DIAGNOSIS — I129 Hypertensive chronic kidney disease with stage 1 through stage 4 chronic kidney disease, or unspecified chronic kidney disease: Secondary | ICD-10-CM | POA: Diagnosis not present

## 2013-10-09 DIAGNOSIS — M6281 Muscle weakness (generalized): Secondary | ICD-10-CM | POA: Diagnosis not present

## 2013-10-09 DIAGNOSIS — R627 Adult failure to thrive: Secondary | ICD-10-CM | POA: Diagnosis not present

## 2013-10-09 DIAGNOSIS — E860 Dehydration: Secondary | ICD-10-CM | POA: Diagnosis not present

## 2013-10-10 DIAGNOSIS — R627 Adult failure to thrive: Secondary | ICD-10-CM | POA: Diagnosis not present

## 2013-10-10 DIAGNOSIS — F0390 Unspecified dementia without behavioral disturbance: Secondary | ICD-10-CM | POA: Diagnosis not present

## 2013-10-10 DIAGNOSIS — I129 Hypertensive chronic kidney disease with stage 1 through stage 4 chronic kidney disease, or unspecified chronic kidney disease: Secondary | ICD-10-CM | POA: Diagnosis not present

## 2013-10-10 DIAGNOSIS — M6281 Muscle weakness (generalized): Secondary | ICD-10-CM | POA: Diagnosis not present

## 2013-10-10 DIAGNOSIS — E860 Dehydration: Secondary | ICD-10-CM | POA: Diagnosis not present

## 2013-10-10 DIAGNOSIS — R262 Difficulty in walking, not elsewhere classified: Secondary | ICD-10-CM | POA: Diagnosis not present

## 2013-10-13 DIAGNOSIS — I129 Hypertensive chronic kidney disease with stage 1 through stage 4 chronic kidney disease, or unspecified chronic kidney disease: Secondary | ICD-10-CM | POA: Diagnosis not present

## 2013-10-13 DIAGNOSIS — F0390 Unspecified dementia without behavioral disturbance: Secondary | ICD-10-CM | POA: Diagnosis not present

## 2013-10-13 DIAGNOSIS — R627 Adult failure to thrive: Secondary | ICD-10-CM | POA: Diagnosis not present

## 2013-10-13 DIAGNOSIS — E860 Dehydration: Secondary | ICD-10-CM | POA: Diagnosis not present

## 2013-10-13 DIAGNOSIS — R262 Difficulty in walking, not elsewhere classified: Secondary | ICD-10-CM | POA: Diagnosis not present

## 2013-10-13 DIAGNOSIS — M6281 Muscle weakness (generalized): Secondary | ICD-10-CM | POA: Diagnosis not present

## 2013-10-19 DIAGNOSIS — I129 Hypertensive chronic kidney disease with stage 1 through stage 4 chronic kidney disease, or unspecified chronic kidney disease: Secondary | ICD-10-CM | POA: Diagnosis not present

## 2013-10-19 DIAGNOSIS — R627 Adult failure to thrive: Secondary | ICD-10-CM | POA: Diagnosis not present

## 2013-10-19 DIAGNOSIS — M6281 Muscle weakness (generalized): Secondary | ICD-10-CM | POA: Diagnosis not present

## 2013-10-19 DIAGNOSIS — R262 Difficulty in walking, not elsewhere classified: Secondary | ICD-10-CM | POA: Diagnosis not present

## 2013-10-19 DIAGNOSIS — F0390 Unspecified dementia without behavioral disturbance: Secondary | ICD-10-CM | POA: Diagnosis not present

## 2013-10-19 DIAGNOSIS — E860 Dehydration: Secondary | ICD-10-CM | POA: Diagnosis not present

## 2013-10-23 DIAGNOSIS — R262 Difficulty in walking, not elsewhere classified: Secondary | ICD-10-CM | POA: Diagnosis not present

## 2013-10-23 DIAGNOSIS — R627 Adult failure to thrive: Secondary | ICD-10-CM | POA: Diagnosis not present

## 2013-10-23 DIAGNOSIS — F0390 Unspecified dementia without behavioral disturbance: Secondary | ICD-10-CM | POA: Diagnosis not present

## 2013-10-23 DIAGNOSIS — M6281 Muscle weakness (generalized): Secondary | ICD-10-CM | POA: Diagnosis not present

## 2013-10-23 DIAGNOSIS — E860 Dehydration: Secondary | ICD-10-CM | POA: Diagnosis not present

## 2013-10-23 DIAGNOSIS — I129 Hypertensive chronic kidney disease with stage 1 through stage 4 chronic kidney disease, or unspecified chronic kidney disease: Secondary | ICD-10-CM | POA: Diagnosis not present

## 2014-06-25 DIAGNOSIS — R160 Hepatomegaly, not elsewhere classified: Secondary | ICD-10-CM | POA: Diagnosis not present

## 2014-06-25 DIAGNOSIS — Z23 Encounter for immunization: Secondary | ICD-10-CM | POA: Diagnosis not present

## 2014-06-25 DIAGNOSIS — R1084 Generalized abdominal pain: Secondary | ICD-10-CM | POA: Diagnosis not present

## 2014-06-25 DIAGNOSIS — H539 Unspecified visual disturbance: Secondary | ICD-10-CM | POA: Diagnosis not present

## 2014-06-25 DIAGNOSIS — E039 Hypothyroidism, unspecified: Secondary | ICD-10-CM | POA: Diagnosis not present

## 2014-06-25 DIAGNOSIS — R32 Unspecified urinary incontinence: Secondary | ICD-10-CM | POA: Diagnosis not present

## 2014-06-25 DIAGNOSIS — J019 Acute sinusitis, unspecified: Secondary | ICD-10-CM | POA: Diagnosis not present

## 2014-06-25 DIAGNOSIS — N390 Urinary tract infection, site not specified: Secondary | ICD-10-CM | POA: Diagnosis not present

## 2014-06-25 DIAGNOSIS — R05 Cough: Secondary | ICD-10-CM | POA: Diagnosis not present

## 2014-06-25 DIAGNOSIS — I10 Essential (primary) hypertension: Secondary | ICD-10-CM | POA: Diagnosis not present

## 2014-07-06 ENCOUNTER — Ambulatory Visit
Admission: RE | Admit: 2014-07-06 | Discharge: 2014-07-06 | Disposition: A | Discharge status: Home / Self Care | Payer: Medicare Other | Source: Ambulatory Visit | Attending: Family Medicine | Admitting: Family Medicine

## 2014-07-06 ENCOUNTER — Other Ambulatory Visit: Payer: Self-pay | Admitting: Family Medicine

## 2014-07-06 DIAGNOSIS — R05 Cough: Secondary | ICD-10-CM

## 2014-07-06 DIAGNOSIS — R053 Chronic cough: Secondary | ICD-10-CM

## 2014-08-21 DIAGNOSIS — Z961 Presence of intraocular lens: Secondary | ICD-10-CM | POA: Diagnosis not present

## 2014-08-21 DIAGNOSIS — H04123 Dry eye syndrome of bilateral lacrimal glands: Secondary | ICD-10-CM | POA: Diagnosis not present

## 2014-08-21 DIAGNOSIS — H01022 Squamous blepharitis right lower eyelid: Secondary | ICD-10-CM | POA: Diagnosis not present

## 2014-08-21 DIAGNOSIS — H01021 Squamous blepharitis right upper eyelid: Secondary | ICD-10-CM | POA: Diagnosis not present

## 2014-08-21 DIAGNOSIS — H01025 Squamous blepharitis left lower eyelid: Secondary | ICD-10-CM | POA: Diagnosis not present

## 2014-08-21 DIAGNOSIS — H01024 Squamous blepharitis left upper eyelid: Secondary | ICD-10-CM | POA: Diagnosis not present

## 2014-11-13 DIAGNOSIS — N400 Enlarged prostate without lower urinary tract symptoms: Secondary | ICD-10-CM | POA: Diagnosis not present

## 2014-11-13 DIAGNOSIS — R634 Abnormal weight loss: Secondary | ICD-10-CM | POA: Diagnosis not present

## 2014-11-13 DIAGNOSIS — N390 Urinary tract infection, site not specified: Secondary | ICD-10-CM | POA: Diagnosis not present

## 2014-11-13 DIAGNOSIS — N183 Chronic kidney disease, stage 3 (moderate): Secondary | ICD-10-CM | POA: Diagnosis not present

## 2014-11-13 DIAGNOSIS — E039 Hypothyroidism, unspecified: Secondary | ICD-10-CM | POA: Diagnosis not present

## 2014-11-13 DIAGNOSIS — I10 Essential (primary) hypertension: Secondary | ICD-10-CM | POA: Diagnosis not present

## 2014-11-13 DIAGNOSIS — K219 Gastro-esophageal reflux disease without esophagitis: Secondary | ICD-10-CM | POA: Diagnosis not present

## 2014-11-13 DIAGNOSIS — R7309 Other abnormal glucose: Secondary | ICD-10-CM | POA: Diagnosis not present

## 2015-04-01 DIAGNOSIS — N183 Chronic kidney disease, stage 3 (moderate): Secondary | ICD-10-CM | POA: Diagnosis not present

## 2015-04-01 DIAGNOSIS — E119 Type 2 diabetes mellitus without complications: Secondary | ICD-10-CM | POA: Diagnosis not present

## 2015-04-01 DIAGNOSIS — Z8673 Personal history of transient ischemic attack (TIA), and cerebral infarction without residual deficits: Secondary | ICD-10-CM | POA: Diagnosis not present

## 2015-04-01 DIAGNOSIS — I10 Essential (primary) hypertension: Secondary | ICD-10-CM | POA: Diagnosis not present

## 2015-04-01 DIAGNOSIS — K219 Gastro-esophageal reflux disease without esophagitis: Secondary | ICD-10-CM | POA: Diagnosis not present

## 2015-04-01 DIAGNOSIS — Z7409 Other reduced mobility: Secondary | ICD-10-CM | POA: Diagnosis not present

## 2015-04-01 DIAGNOSIS — E039 Hypothyroidism, unspecified: Secondary | ICD-10-CM | POA: Diagnosis not present

## 2015-04-01 DIAGNOSIS — N400 Enlarged prostate without lower urinary tract symptoms: Secondary | ICD-10-CM | POA: Diagnosis not present

## 2015-05-01 DIAGNOSIS — R296 Repeated falls: Secondary | ICD-10-CM | POA: Diagnosis not present

## 2015-05-01 DIAGNOSIS — M79604 Pain in right leg: Secondary | ICD-10-CM | POA: Diagnosis not present

## 2016-02-17 DIAGNOSIS — Z8673 Personal history of transient ischemic attack (TIA), and cerebral infarction without residual deficits: Secondary | ICD-10-CM | POA: Diagnosis not present

## 2016-02-17 DIAGNOSIS — Z23 Encounter for immunization: Secondary | ICD-10-CM | POA: Diagnosis not present

## 2016-02-17 DIAGNOSIS — R8290 Unspecified abnormal findings in urine: Secondary | ICD-10-CM | POA: Diagnosis not present

## 2016-02-17 DIAGNOSIS — E119 Type 2 diabetes mellitus without complications: Secondary | ICD-10-CM | POA: Diagnosis not present

## 2016-02-17 DIAGNOSIS — R1011 Right upper quadrant pain: Secondary | ICD-10-CM | POA: Diagnosis not present

## 2016-02-17 DIAGNOSIS — E039 Hypothyroidism, unspecified: Secondary | ICD-10-CM | POA: Diagnosis not present

## 2016-02-17 DIAGNOSIS — I10 Essential (primary) hypertension: Secondary | ICD-10-CM | POA: Diagnosis not present

## 2016-02-17 DIAGNOSIS — Z7409 Other reduced mobility: Secondary | ICD-10-CM | POA: Diagnosis not present

## 2016-02-17 DIAGNOSIS — N400 Enlarged prostate without lower urinary tract symptoms: Secondary | ICD-10-CM | POA: Diagnosis not present

## 2016-02-17 DIAGNOSIS — K219 Gastro-esophageal reflux disease without esophagitis: Secondary | ICD-10-CM | POA: Diagnosis not present

## 2016-07-20 DIAGNOSIS — N400 Enlarged prostate without lower urinary tract symptoms: Secondary | ICD-10-CM | POA: Diagnosis not present

## 2016-07-20 DIAGNOSIS — Z8673 Personal history of transient ischemic attack (TIA), and cerebral infarction without residual deficits: Secondary | ICD-10-CM | POA: Diagnosis not present

## 2016-07-20 DIAGNOSIS — D638 Anemia in other chronic diseases classified elsewhere: Secondary | ICD-10-CM | POA: Diagnosis not present

## 2016-07-20 DIAGNOSIS — E1121 Type 2 diabetes mellitus with diabetic nephropathy: Secondary | ICD-10-CM | POA: Diagnosis not present

## 2016-07-20 DIAGNOSIS — I10 Essential (primary) hypertension: Secondary | ICD-10-CM | POA: Diagnosis not present

## 2016-07-20 DIAGNOSIS — N183 Chronic kidney disease, stage 3 (moderate): Secondary | ICD-10-CM | POA: Diagnosis not present

## 2016-07-20 DIAGNOSIS — Z7409 Other reduced mobility: Secondary | ICD-10-CM | POA: Diagnosis not present

## 2016-07-20 DIAGNOSIS — E039 Hypothyroidism, unspecified: Secondary | ICD-10-CM | POA: Diagnosis not present

## 2016-07-20 DIAGNOSIS — R8290 Unspecified abnormal findings in urine: Secondary | ICD-10-CM | POA: Diagnosis not present

## 2016-07-20 DIAGNOSIS — Z23 Encounter for immunization: Secondary | ICD-10-CM | POA: Diagnosis not present

## 2016-11-29 DIAGNOSIS — G9341 Metabolic encephalopathy: Secondary | ICD-10-CM

## 2016-11-29 HISTORY — DX: Metabolic encephalopathy: G93.41

## 2016-12-10 ENCOUNTER — Inpatient Hospital Stay (HOSPITAL_COMMUNITY)
Admission: EM | Admit: 2016-12-10 | Discharge: 2016-12-15 | DRG: 689 | Disposition: A | Discharge status: Skilled Nursing Facility | Payer: Medicare Other | Attending: Family Medicine | Admitting: Family Medicine

## 2016-12-10 ENCOUNTER — Emergency Department (HOSPITAL_COMMUNITY): Payer: Medicare Other

## 2016-12-10 ENCOUNTER — Encounter (HOSPITAL_COMMUNITY): Payer: Self-pay | Admitting: Emergency Medicine

## 2016-12-10 DIAGNOSIS — M1990 Unspecified osteoarthritis, unspecified site: Secondary | ICD-10-CM | POA: Diagnosis present

## 2016-12-10 DIAGNOSIS — N390 Urinary tract infection, site not specified: Secondary | ICD-10-CM | POA: Diagnosis present

## 2016-12-10 DIAGNOSIS — Z7982 Long term (current) use of aspirin: Secondary | ICD-10-CM

## 2016-12-10 DIAGNOSIS — N183 Chronic kidney disease, stage 3 unspecified: Secondary | ICD-10-CM | POA: Diagnosis present

## 2016-12-10 DIAGNOSIS — I63412 Cerebral infarction due to embolism of left middle cerebral artery: Secondary | ICD-10-CM | POA: Diagnosis not present

## 2016-12-10 DIAGNOSIS — I639 Cerebral infarction, unspecified: Secondary | ICD-10-CM | POA: Diagnosis present

## 2016-12-10 DIAGNOSIS — F0390 Unspecified dementia without behavioral disturbance: Secondary | ICD-10-CM | POA: Diagnosis not present

## 2016-12-10 DIAGNOSIS — R402234 Coma scale, best verbal response, inappropriate words, 24 hours or more after hospital admission: Secondary | ICD-10-CM | POA: Diagnosis not present

## 2016-12-10 DIAGNOSIS — R29810 Facial weakness: Secondary | ICD-10-CM | POA: Diagnosis not present

## 2016-12-10 DIAGNOSIS — R402144 Coma scale, eyes open, spontaneous, 24 hours or more after hospital admission: Secondary | ICD-10-CM | POA: Diagnosis not present

## 2016-12-10 DIAGNOSIS — Z8673 Personal history of transient ischemic attack (TIA), and cerebral infarction without residual deficits: Secondary | ICD-10-CM

## 2016-12-10 DIAGNOSIS — R402364 Coma scale, best motor response, obeys commands, 24 hours or more after hospital admission: Secondary | ICD-10-CM | POA: Diagnosis not present

## 2016-12-10 DIAGNOSIS — R4701 Aphasia: Secondary | ICD-10-CM | POA: Diagnosis not present

## 2016-12-10 DIAGNOSIS — E039 Hypothyroidism, unspecified: Secondary | ICD-10-CM | POA: Diagnosis not present

## 2016-12-10 DIAGNOSIS — I129 Hypertensive chronic kidney disease with stage 1 through stage 4 chronic kidney disease, or unspecified chronic kidney disease: Secondary | ICD-10-CM | POA: Diagnosis present

## 2016-12-10 DIAGNOSIS — R4182 Altered mental status, unspecified: Secondary | ICD-10-CM

## 2016-12-10 DIAGNOSIS — I6789 Other cerebrovascular disease: Secondary | ICD-10-CM | POA: Diagnosis not present

## 2016-12-10 DIAGNOSIS — G9341 Metabolic encephalopathy: Secondary | ICD-10-CM | POA: Diagnosis present

## 2016-12-10 DIAGNOSIS — R7881 Bacteremia: Secondary | ICD-10-CM | POA: Diagnosis present

## 2016-12-10 DIAGNOSIS — R059 Cough, unspecified: Secondary | ICD-10-CM

## 2016-12-10 DIAGNOSIS — R627 Adult failure to thrive: Secondary | ICD-10-CM | POA: Diagnosis present

## 2016-12-10 DIAGNOSIS — I459 Conduction disorder, unspecified: Secondary | ICD-10-CM | POA: Diagnosis not present

## 2016-12-10 DIAGNOSIS — G459 Transient cerebral ischemic attack, unspecified: Secondary | ICD-10-CM | POA: Diagnosis not present

## 2016-12-10 DIAGNOSIS — R1310 Dysphagia, unspecified: Secondary | ICD-10-CM | POA: Diagnosis present

## 2016-12-10 DIAGNOSIS — G8191 Hemiplegia, unspecified affecting right dominant side: Secondary | ICD-10-CM | POA: Diagnosis not present

## 2016-12-10 DIAGNOSIS — K219 Gastro-esophageal reflux disease without esophagitis: Secondary | ICD-10-CM | POA: Diagnosis present

## 2016-12-10 DIAGNOSIS — B9620 Unspecified Escherichia coli [E. coli] as the cause of diseases classified elsewhere: Secondary | ICD-10-CM | POA: Diagnosis present

## 2016-12-10 DIAGNOSIS — R29726 NIHSS score 26: Secondary | ICD-10-CM | POA: Diagnosis not present

## 2016-12-10 DIAGNOSIS — R4781 Slurred speech: Secondary | ICD-10-CM | POA: Diagnosis not present

## 2016-12-10 DIAGNOSIS — E1122 Type 2 diabetes mellitus with diabetic chronic kidney disease: Secondary | ICD-10-CM | POA: Diagnosis present

## 2016-12-10 DIAGNOSIS — R05 Cough: Secondary | ICD-10-CM

## 2016-12-10 DIAGNOSIS — R531 Weakness: Secondary | ICD-10-CM

## 2016-12-10 HISTORY — DX: Urinary tract infection, site not specified: N39.0

## 2016-12-10 LAB — URINALYSIS, ROUTINE W REFLEX MICROSCOPIC
Bilirubin Urine: NEGATIVE
GLUCOSE, UA: NEGATIVE mg/dL
KETONES UR: NEGATIVE mg/dL
Nitrite: NEGATIVE
PROTEIN: NEGATIVE mg/dL
Specific Gravity, Urine: 1.015 (ref 1.005–1.030)
pH: 5.5 (ref 5.0–8.0)

## 2016-12-10 LAB — AMMONIA: Ammonia: 17 umol/L (ref 9–35)

## 2016-12-10 LAB — COMPREHENSIVE METABOLIC PANEL
ALBUMIN: 3.2 g/dL — AB (ref 3.5–5.0)
ALK PHOS: 57 U/L (ref 38–126)
ALT: 21 U/L (ref 17–63)
ANION GAP: 8 (ref 5–15)
AST: 18 U/L (ref 15–41)
BILIRUBIN TOTAL: 0.4 mg/dL (ref 0.3–1.2)
BUN: 21 mg/dL — AB (ref 6–20)
CALCIUM: 9 mg/dL (ref 8.9–10.3)
CO2: 24 mmol/L (ref 22–32)
CREATININE: 1.73 mg/dL — AB (ref 0.61–1.24)
Chloride: 109 mmol/L (ref 101–111)
GFR calc Af Amer: 37 mL/min — ABNORMAL LOW (ref >60)
GFR calc non Af Amer: 32 mL/min — ABNORMAL LOW (ref >60)
GLUCOSE: 122 mg/dL — AB (ref 65–99)
Potassium: 4.4 mmol/L (ref 3.5–5.1)
Sodium: 141 mmol/L (ref 135–145)
Total Protein: 7.6 g/dL (ref 6.5–8.1)

## 2016-12-10 LAB — CBC WITH DIFFERENTIAL/PLATELET
BASOS PCT: 0 %
Basophils Absolute: 0 10*3/uL (ref 0.0–0.1)
EOS ABS: 0.2 10*3/uL (ref 0.0–0.7)
EOS PCT: 3 %
HCT: 36.6 % — ABNORMAL LOW (ref 39.0–52.0)
Hemoglobin: 11.8 g/dL — ABNORMAL LOW (ref 13.0–17.0)
Lymphocytes Relative: 32 %
Lymphs Abs: 2.4 10*3/uL (ref 0.7–4.0)
MCH: 29.6 pg (ref 26.0–34.0)
MCHC: 32.2 g/dL (ref 30.0–36.0)
MCV: 91.7 fL (ref 78.0–100.0)
MONO ABS: 0.5 10*3/uL (ref 0.1–1.0)
MONOS PCT: 7 %
Neutro Abs: 4.2 10*3/uL (ref 1.7–7.7)
Neutrophils Relative %: 58 %
Platelets: 234 10*3/uL (ref 150–400)
RBC: 3.99 MIL/uL — ABNORMAL LOW (ref 4.22–5.81)
RDW: 13.2 % (ref 11.5–15.5)
WBC: 7.3 10*3/uL (ref 4.0–10.5)

## 2016-12-10 LAB — URINALYSIS, MICROSCOPIC (REFLEX)

## 2016-12-10 LAB — PROTIME-INR
INR: 1.14
PROTHROMBIN TIME: 14.6 s (ref 11.4–15.2)

## 2016-12-10 LAB — LACTIC ACID, PLASMA: Lactic Acid, Venous: 2.1 mmol/L (ref 0.5–1.9)

## 2016-12-10 LAB — I-STAT CG4 LACTIC ACID, ED: Lactic Acid, Venous: 1.52 mmol/L (ref 0.5–1.9)

## 2016-12-10 MED ORDER — ACETAMINOPHEN 325 MG PO TABS
650.0000 mg | ORAL_TABLET | Freq: Four times a day (QID) | ORAL | Status: DC | PRN
  Administered 2016-12-10: 650 mg via ORAL
  Filled 2016-12-10: qty 2

## 2016-12-10 MED ORDER — DEXTROSE 5 % IV SOLN
1.0000 g | INTRAVENOUS | Status: DC
  Filled 2016-12-10: qty 10

## 2016-12-10 MED ORDER — ONDANSETRON HCL 4 MG/2ML IJ SOLN: 4.0000 mg | Freq: Four times a day (QID) | INTRAMUSCULAR | Status: DC | PRN

## 2016-12-10 MED ORDER — SODIUM CHLORIDE 0.9 % IV BOLUS (SEPSIS)
1000.0000 mL | Freq: Once | INTRAVENOUS | Status: AC
  Administered 2016-12-10: 1000 mL via INTRAVENOUS

## 2016-12-10 MED ORDER — PANTOPRAZOLE SODIUM 40 MG PO TBEC
40.0000 mg | DELAYED_RELEASE_TABLET | Freq: Every day | ORAL | Status: DC
  Administered 2016-12-10 – 2016-12-15 (×4): 40 mg via ORAL
  Filled 2016-12-10 (×4): qty 1

## 2016-12-10 MED ORDER — BISACODYL 10 MG RE SUPP: 10.0000 mg | Freq: Every day | RECTAL | Status: DC | PRN

## 2016-12-10 MED ORDER — SODIUM CHLORIDE 0.9 % IV SOLN
INTRAVENOUS | Status: AC
  Administered 2016-12-10 – 2016-12-11 (×4): via INTRAVENOUS

## 2016-12-10 MED ORDER — ONDANSETRON HCL 4 MG PO TABS: 4.0000 mg | ORAL_TABLET | Freq: Four times a day (QID) | ORAL | Status: DC | PRN

## 2016-12-10 MED ORDER — ASPIRIN EC 81 MG PO TBEC
81.0000 mg | DELAYED_RELEASE_TABLET | Freq: Every day | ORAL | Status: DC
  Administered 2016-12-10 – 2016-12-11 (×2): 81 mg via ORAL
  Filled 2016-12-10 (×2): qty 1

## 2016-12-10 MED ORDER — TAMSULOSIN HCL 0.4 MG PO CAPS
0.4000 mg | ORAL_CAPSULE | Freq: Every day | ORAL | Status: DC
  Administered 2016-12-10 – 2016-12-11 (×2): 0.4 mg via ORAL
  Filled 2016-12-10 (×2): qty 1

## 2016-12-10 MED ORDER — MAGNESIUM CITRATE PO SOLN: 1.0000 | Freq: Once | ORAL | Status: DC | PRN

## 2016-12-10 MED ORDER — ACETAMINOPHEN 650 MG RE SUPP: 650.0000 mg | Freq: Four times a day (QID) | RECTAL | Status: DC | PRN

## 2016-12-10 MED ORDER — SODIUM CHLORIDE 0.9 % IV SOLN: Freq: Once | INTRAVENOUS | Status: DC

## 2016-12-10 MED ORDER — HYDRALAZINE HCL 20 MG/ML IJ SOLN: 10.0000 mg | Freq: Three times a day (TID) | INTRAMUSCULAR | Status: DC | PRN

## 2016-12-10 MED ORDER — LEVOTHYROXINE SODIUM 75 MCG PO TABS
75.0000 ug | ORAL_TABLET | Freq: Every day | ORAL | Status: DC
  Administered 2016-12-11: 75 ug via ORAL
  Filled 2016-12-10: qty 1

## 2016-12-10 MED ORDER — SENNOSIDES-DOCUSATE SODIUM 8.6-50 MG PO TABS: 1.0000 | ORAL_TABLET | Freq: Every evening | ORAL | Status: DC | PRN

## 2016-12-10 MED ORDER — METOPROLOL TARTRATE 25 MG PO TABS
25.0000 mg | ORAL_TABLET | Freq: Two times a day (BID) | ORAL | Status: DC
  Administered 2016-12-10 – 2016-12-11 (×3): 25 mg via ORAL
  Filled 2016-12-10 (×4): qty 1

## 2016-12-10 MED ORDER — DEXTROSE 5 % IV SOLN
1.0000 g | Freq: Once | INTRAVENOUS | Status: AC
  Administered 2016-12-10: 1 g via INTRAVENOUS
  Filled 2016-12-10: qty 10

## 2016-12-10 NOTE — ED Notes (Signed)
Got patient hooked up to the monitor help with in and out cath patient is resting family at bedside

## 2016-12-10 NOTE — ED Triage Notes (Signed)
Pt from home via [facility] with c/o altered mental status with increasing lethargy and weakness starting Tues.  Pt was urinary incontinent and smells of possible UTI.  NAD, alert.

## 2016-12-10 NOTE — H&P (Signed)
History and Physical    Caliber Landess ZOX:[id number] DOB: 1919-05-06 DOA: 12/10/2016   PCP: Herb Grays, MD (Inactive)   Patient coming from:  Home    Chief Complaint: generalized weakness and confusion   HPI: Bryce Potter is a 83 y.o. male with medical history significant for arthritis, dementia, [name], hypertension, hypothyroidism,stroke, brought to the emergency department by his daughter, due to 1 to 2 day history of increased debilitation, and acute confusion. Daughter reports strong urine odsor. History is obtained by her, as the patient is somewhat confused at this time.Denies fevers, chills, night sweats, vision changes, or mucositis. Denies any respiratory complaints. Denies any chest pain or palpitations. Denies lower extremity swelling. Denies nausea, heartburn or change in bowel habits.,Appetite is normal. Denies any dysuria, he does have increased frequency. Denies abnormal skin rashes, or neuropathy. Denies any bleeding issues such as epistaxis, hematemesis, hematuria or hematochezia.    ED Course:  BP 132/71   Pulse 69   Temp 98.3 F (36.8 C) (Rectal)   Resp 20   Ht  (1.854 m)   Wt 95.3 kg (210 lb)   SpO2 99%   BMI 27.71 kg/m    lactic acid 1.52 sodium 141 potassium 4.4 bicarb 24 BUN 21 creatinine 1.73 calcium 9  Anion gap  8 GFR 37  lactic acid 1.52 white count 7.3 hemoglobin 11.8 platelets 234 CT of the head without acute intracranial abnormality  chest x-ray essentially unremarkable  urine with large leukocytes negative nitrites  Review of Systems: As per HPI otherwise 10 point review of systems negative.   Past Medical History:  Diagnosis Date  . Arthritis   . Dementia   . GERD (gastroesophageal reflux disease)   . Hypertension   . Hypothyroidism   . Stroke [facility]) in age 56's - 80's    Past Surgical History:  Procedure Laterality Date  . CATARACT EXTRACTION Bilateral     Social History Social History   Social History  . Marital status: Single      Spouse name: N/A  . Number of children: N/A  . Years of education: N/A   Occupational History  . Not on file.   Social History Main Topics  . Smoking status: Never Smoker  . Smokeless tobacco: Never Used  . Alcohol use No  . Drug use: No  . Sexual activity: Not on file   Other Topics Concern  . Not on file   Social History Narrative  . No narrative on file     No Known Allergies  History reviewed. No pertinent family history.    Prior to Admission medications   Medication Sig Start Date End Date Taking? Authorizing Provider  aspirin EC 81 MG tablet Take 81 mg by mouth daily.   Yes Historical Provider, MD  dextromethorphan-guaiFENesin (MUCINEX DM) 30-600 MG per 12 hr tablet Take 1 tablet by mouth 2 (two) times daily. 08/26/13  Yes Estela Isaiah Blakes, MD  levothyroxine (SYNTHROID, LEVOTHROID) 75 MCG tablet Take 75 mcg by mouth daily before breakfast.   Yes Historical Provider, MD  metoprolol tartrate (LOPRESSOR) 25 MG tablet Take 25 mg by mouth 2 (two) times daily.   Yes Historical Provider, MD  omega-3 acid ethyl esters (LOVAZA) 1 G capsule Take 1 g by mouth daily.   Yes Historical Provider, MD  omeprazole (PRILOSEC) 40 MG capsule Take 40 mg by mouth daily.    Yes Historical Provider, MD  tamsulosin (FLOMAX) 0.4 MG CAPS capsule Take 0.4 mg by mouth daily  after supper.   Yes Historical Provider, MD    Physical Exam:  Vitals:   12/10/16 1130 12/10/16 1145 12/10/16 1200 12/10/16 1215  BP: (!) 152/70 (!) 144/71 (!) 146/80 132/71  Pulse: 72 74 75 69  Resp: Temp:      TempSrc:      SpO2: 99% 100% 100% 99%  Weight:      Height:       Constitutional: NAD, calm, comfortable, Somewhat confused, but able to follow fairy simple commands Eyes: PERRL, lids and conjunctivae normal ENMT: Mucous membranes are moist, without exudate or lesions  Neck: normal, supple, no masses, no thyromegaly Respiratory: clear to auscultation bilaterally, no wheezing, no  crackles. Normal respiratory effort  Cardiovascular: Regular rate and rhythm, no murmurs, rubs or gallops. No extremity edema. 2+ pedal pulses. No carotid bruits.  Abdomen: Soft, mild suprapubic tenderness, No hepatosplenomegaly. Bowel sounds positive.  Musculoskeletal: no clubbing / cyanosis. Moves all extremities Skin: no jaundice, No lesions. Skin very dry Neurologic: Sensation intact  Strength equal in all extremities. Unable to engage in conversation at this time due to confusion, in the setting of known dementia.     Labs on Admission: I have personally reviewed following labs and imaging studies  CBC:  Recent Labs Lab 12/10/16 1103  WBC 7.3  NEUTROABS 4.2  HGB 11.8*  HCT 36.6*  MCV 91.7  PLT 234    Basic Metabolic Panel:  Recent Labs Lab 12/10/16 1103  NA 141  K 4.4  CL 109  CO2 24  GLUCOSE 122*  BUN 21*  CREATININE 1.73*  CALCIUM 9.0    GFR: Estimated Creatinine Clearance: 28.2 mL/min (A) (by C-G formula based on SCr of 1.73 mg/dL (H)).  Liver Function Tests:  Recent Labs Lab 12/10/16 1103  AST 18  ALT 21  ALKPHOS 57  BILITOT 0.4  PROT 7.6  ALBUMIN 3.2*   No results for input(s): LIPASE, AMYLASE in the last 168 hours. No results for input(s): AMMONIA in the last 168 hours.  Coagulation Profile:  Recent Labs Lab 12/10/16 1103  INR 1.14    Cardiac Enzymes: No results for input(s): CKTOTAL, CKMB, CKMBINDEX, TROPONINI in the last 168 hours.  BNP (last 3 results) No results for input(s): PROBNP in the last 8760 hours.  HbA1C: No results for input(s): HGBA1C in the last 72 hours.  CBG: No results for input(s): GLUCAP in the last 168 hours.  Lipid Profile: No results for input(s): CHOL, HDL, LDLCALC, TRIG, CHOLHDL, LDLDIRECT in the last 72 hours.  Thyroid Function Tests: No results for input(s): TSH, T4TOTAL, FREET4, T3FREE, THYROIDAB in the last 72 hours.  Anemia Panel: No results for input(s): VITAMINB12, FOLATE, FERRITIN, TIBC,  IRON, RETICCTPCT in the last 72 hours.  Urine analysis:    Component Value Date/Time   COLORURINE YELLOW 12/10/2016 1050   APPEARANCEUR CLOUDY (A) 12/10/2016 1050   LABSPEC 1.015 12/10/2016 1050   PHURINE 5.5 12/10/2016 1050   GLUCOSEU NEGATIVE 12/10/2016 1050   HGBUR LARGE (A) 12/10/2016 1050   BILIRUBINUR NEGATIVE 12/10/2016 1050   KETONESUR NEGATIVE 12/10/2016 1050   PROTEINUR NEGATIVE 12/10/2016 1050   UROBILINOGEN 1.0 08/25/2013 1427   NITRITE NEGATIVE 12/10/2016 1050   LEUKOCYTESUR LARGE (A) 12/10/2016 1050    Sepsis Labs: (procalcitonin:4,lacticidven:4) )No results found for this or any previous visit (from the past 240 hour(s)).   Radiological Exams on Admission: Dg Chest 2 View  Result Date: 12/10/2016 CLINICAL DATA:  Altered mental status, history of  stroke EXAM: CHEST  2 VIEW COMPARISON:  07/06/2014 FINDINGS: Borderline cardiomegaly noted. No pulmonary edema. There is left basilar atelectasis or infiltrate. Degenerative changes thoracic spine again noted. IMPRESSION: No pulmonary edema. Left basilar atelectasis on infiltrate. Degenerative changes thoracic spine. Electronically Signed   By: Natasha Mead M.D.   On: 12/10/2016 12:05   Ct Head Wo Contrast  Result Date: 12/10/2016 CLINICAL DATA:  Altered mental status, increasing lethargy and weakness EXAM: CT HEAD WITHOUT CONTRAST TECHNIQUE: Contiguous axial images were obtained from the base of the skull through the vertex without intravenous contrast. COMPARISON:  CT brain scan of 08/25/2013 FINDINGS: Brain: Ventriculomegaly is unchanged as is diffuse cortical atrophy. The septum is midline in position. Moderately severe small vessel ischemic change throughout the periventricular white matter is unchanged as well. No hemorrhage, mass lesion, or acute infarction is seen. Vascular: No vascular abnormality is seen on this unenhanced study. Skull: No calvarial abnormality is seen. Sinuses/Orbits: There is mild mucosal  thickening within the maxillary sinuses but no present sinusitis is seen. Other: None. IMPRESSION: 1. Stable atrophy and significant small vessel ischemic change throughout the periventricular white matter. No acute intracranial abnormality. 2. Mild mucosal thickening in the maxillary sinuses. Electronically Signed   By: Dwyane Dee M.D.   On: 12/10/2016 11:53    EKG: Independently reviewed.  Assessment/Plan Active Problems:   Weakness generalized   FTT (failure to thrive) in adult   CKD (chronic kidney disease), stage III   Hypothyroidism   UTI (urinary tract infection)    Acute encephalopathy likely due to UTI suggested by UA + large leukocytes in urine. Cultures  Pending. LActic acid 1.52  WBC is 7.3  Ammonia pending. CT head neg for acute findings.   Afebrile. Lactic acid is normal  Received IV hydration at 100 cc/h   In ED.  Received IV Ceftriaxone x1    F/u urine culture Ceftriaxone IV   Follow CBC in am   Continue IVF   Hypertension BP 132/71   Pulse 69  Controlled Continue home anti-hypertensive medications     Chronic kidney disease stage 3 baseline creatinine    Current Cr  Lab Results  Component Value Date   CREATININE 1.73 (H) 12/10/2016   CREATININE 1.55 (H) 08/26/2013   CREATININE 1.80 (H) 08/25/2013   IVF Hold diuretics  Repeat CMET in am   Hypothyroidism: Continue home Synthroid  Deconditioning  PT/OT consult    DVT prophylaxis: SCD's   Code Status:   Full    Family Communication:  Discussed with daughter Disposition Plan: Expect patient to be discharged to home after condition improves Consults called:    None  Admission status Medsurg  Obs    Guage Efferson E, PA-C [facility]   12/10/2016, 12:37 PM

## 2016-12-10 NOTE — ED Notes (Signed)
Patient transported to CT 

## 2016-12-10 NOTE — ED Provider Notes (Signed)
[facility]-EMERGENCY DEPT Provider Note   CSN: [id number] Arrival date & time: 12/10/16  1034     History   Chief Complaint Chief Complaint  Patient presents with  . Altered Mental Status    HPI Bryce Potter is a 83 y.o. male.  HPI  83 y.o. male with a hx of Dementia, HTN, Hypothyroidism, Stroke, CKD Stage III, presents to the Emergency Department today via EMS from home due to increase lethargy this AM. Noted AMS from baseline. No reported falls. Symptoms began on Wednesday. Baseline on Tuesday. Noted urinary incontinence. No N/V/D. NO back pain per daughter. No N/V/D as well.    Level V Caveat: Dementia   Past Medical History:  Diagnosis Date  . Arthritis   . Dementia   . GERD (gastroesophageal reflux disease)   . Hypertension   . Hypothyroidism   . Stroke [facility]) in age 50's - 27's    Patient Active Problem List   Diagnosis Date Noted  . Weakness generalized 08/25/2013  . FTT (failure to thrive) in adult 08/25/2013  . ARF (acute renal failure) (HCC) 08/25/2013  . CKD (chronic kidney disease), stage III 08/25/2013  . Cough 08/25/2013  . Hypothyroidism 08/25/2013    Past Surgical History:  Procedure Laterality Date  . CATARACT EXTRACTION Bilateral        Home Medications    Prior to Admission medications   Medication Sig Start Date End Date Taking? Authorizing Provider  acetaminophen-codeine (TYLENOL #3) 300-30 MG per tablet Take 1 tablet by mouth every 4 (four) hours as needed for moderate pain.    Historical Provider, MD  aspirin EC 81 MG tablet Take 81 mg by mouth daily.    Historical Provider, MD  dextromethorphan-guaiFENesin (MUCINEX DM) 30-600 MG per 12 hr tablet Take 1 tablet by mouth 2 (two) times daily. 08/26/13   Henderson Cloud, MD  donepezil (ARICEPT) 5 MG tablet Take 5 mg by mouth at bedtime.    Historical Provider, MD  levothyroxine (SYNTHROID, LEVOTHROID) 75 MCG tablet Take 75 mcg by mouth daily before breakfast.    Historical Provider, MD   metoprolol tartrate (LOPRESSOR) 25 MG tablet Take 25 mg by mouth 2 (two) times daily.    Historical Provider, MD  omega-3 acid ethyl esters (LOVAZA) 1 G capsule Take 1 g by mouth daily.    Historical Provider, MD  omeprazole (PRILOSEC) 20 MG capsule Take 20 mg by mouth daily.    Historical Provider, MD  tamsulosin (FLOMAX) 0.4 MG CAPS capsule Take 0.4 mg by mouth daily after supper.    Historical Provider, MD    Family History History reviewed. No pertinent family history.  Social History Social History  Substance Use Topics  . Smoking status: Never Smoker  . Smokeless tobacco: Never Used  . Alcohol use No     Allergies   Patient has no known allergies.   Review of Systems Review of Systems  Unable to perform ROS: Dementia   Physical Exam Updated Vital Signs BP (!) 155/74   Pulse 82   Temp 98.3 F (36.8 C) (Rectal)   Resp 19   Ht  (1.854 m)   Wt 95.3 kg   SpO2 99%   BMI 27.71 kg/m   Physical Exam  Constitutional: Vital signs are normal. He appears well-developed and well-nourished. No distress.  HENT:  Head: Normocephalic and atraumatic.  Right Ear: Hearing, tympanic membrane, external ear and ear canal normal.  Left Ear: Hearing, tympanic membrane, external ear and ear canal  normal.  Nose: Nose normal.  Mouth/Throat: Uvula is midline, oropharynx is clear and moist and mucous membranes are normal. No trismus in the jaw. No oropharyngeal exudate, posterior oropharyngeal erythema or tonsillar abscesses.  Eyes: Conjunctivae and EOM are normal. Pupils are equal, round, and reactive to light.  Neck: Normal range of motion. Neck supple. No tracheal deviation present.  Cardiovascular: Normal rate, regular rhythm, S1 normal, S2 normal, normal heart sounds, intact distal pulses and normal pulses.   Pulmonary/Chest: Effort normal and breath sounds normal. No respiratory distress. He has no decreased breath sounds. He has no wheezes. He has no rhonchi. He has no rales.    Abdominal: Normal appearance and bowel sounds are normal. There is no tenderness.  Musculoskeletal: Normal range of motion.  Neurological: He is alert. He has normal strength. He is disoriented. No cranial nerve deficit or sensory deficit.  Cranial Nerves:  II: Pupils equal, round, reactive to light III,IV, VI: ptosis not present, extra-ocular motions intact bilaterally  V,VII: smile symmetric, facial light touch sensation equal VIII: hearing grossly normal bilaterally  IX,X: midline uvula rise  XI: bilateral shoulder shrug equal and strong XII: midline tongue extension  Skin: Skin is warm and dry.  Psychiatric: He has a normal mood and affect. His speech is normal and behavior is normal. Thought content normal.  Nursing note and vitals reviewed.  ED Treatments / Results  Labs (all labs ordered are listed, but only abnormal results are displayed) Labs Reviewed  COMPREHENSIVE METABOLIC PANEL - Abnormal; Notable for the following:       Result Value   Glucose, Bld 122 (*)    BUN 21 (*)    Creatinine, Ser 1.73 (*)    Albumin 3.2 (*)    GFR calc non Af Amer 32 (*)    GFR calc Af Amer 37 (*)    All other components within normal limits  CBC WITH DIFFERENTIAL/PLATELET - Abnormal; Notable for the following:    RBC 3.99 (*)    Hemoglobin 11.8 (*)    HCT 36.6 (*)    All other components within normal limits  URINALYSIS, ROUTINE W REFLEX MICROSCOPIC - Abnormal; Notable for the following:    APPearance CLOUDY (*)    Hgb urine dipstick LARGE (*)    Leukocytes, UA LARGE (*)    All other components within normal limits  URINALYSIS, MICROSCOPIC (REFLEX) - Abnormal; Notable for the following:    Bacteria, UA MANY (*)    Squamous Epithelial / LPF 0-5 (*)    All other components within normal limits  CULTURE, BLOOD (ROUTINE X 2)  CULTURE, BLOOD (ROUTINE X 2)  PROTIME-INR  I-STAT CG4 LACTIC ACID, ED    EKG  EKG Interpretation None       Radiology Dg Chest 2 View  Result  Date: 12/10/2016 CLINICAL DATA:  Altered mental status, history of stroke EXAM: CHEST  2 VIEW COMPARISON:  07/06/2014 FINDINGS: Borderline cardiomegaly noted. No pulmonary edema. There is left basilar atelectasis or infiltrate. Degenerative changes thoracic spine again noted. IMPRESSION: No pulmonary edema. Left basilar atelectasis on infiltrate. Degenerative changes thoracic spine. Electronically Signed   By: Natasha Mead M.D.   On: 12/10/2016 12:05   Ct Head Wo Contrast  Result Date: 12/10/2016 CLINICAL DATA:  Altered mental status, increasing lethargy and weakness EXAM: CT HEAD WITHOUT CONTRAST TECHNIQUE: Contiguous axial images were obtained from the base of the skull through the vertex without intravenous contrast. COMPARISON:  CT brain scan of 08/25/2013 FINDINGS: Brain: Ventriculomegaly  is unchanged as is diffuse cortical atrophy. The septum is midline in position. Moderately severe small vessel ischemic change throughout the periventricular white matter is unchanged as well. No hemorrhage, mass lesion, or acute infarction is seen. Vascular: No vascular abnormality is seen on this unenhanced study. Skull: No calvarial abnormality is seen. Sinuses/Orbits: There is mild mucosal thickening within the maxillary sinuses but no present sinusitis is seen. Other: None. IMPRESSION: 1. Stable atrophy and significant small vessel ischemic change throughout the periventricular white matter. No acute intracranial abnormality. 2. Mild mucosal thickening in the maxillary sinuses. Electronically Signed   By: Dwyane Dee M.D.   On: 12/10/2016 11:53    Procedures Procedures (including critical care time)  Medications Ordered in ED Medications  cefTRIAXone (ROCEPHIN) 1 g in dextrose 5 % 50 mL IVPB (1 g Intravenous New Bag/Given 12/10/16 1218)     Initial Impression / Assessment and Plan / ED Course  I have reviewed the triage vital signs and the nursing notes.  Pertinent labs & imaging results that were  available during my care of the patient were reviewed by me and considered in my medical decision making (see chart for details).  Final Clinical Impressions(s) / ED Diagnoses  {I have reviewed and evaluated the relevant laboratory values. {I have reviewed and evaluated the relevant imaging studies. {I have interpreted the relevant EKG. {I have reviewed the relevant previous healthcare records. {I have reviewed EMS Documentation. {I obtained HPI from historian. {Patient discussed with supervising physician.  ED Course:  Assessment: Pt is a 83 y.o. male with hx Dementia, HTN, Hypothyroidism, Stroke, CKD Stage III who presents with AMS from baseline since Tuesday. EMS called by daughter. No fever en route. On exam, pt in NAD. Nontoxic/nonseptic appearing. VSS. Afebrile. Lungs CTA. Heart RRR. Abdomen nontender soft. CN evaluated and unremarkable. Due to foul smelling urine and cloudy on initial catheterization. Started on rocephin. iStat lactic acid 1.52. CBC without leukocytosis. CMP unremarkable. UA with signs of UTI. Blood Cultures Drawn. CXR unremarkable. CT Head unreamrkable. Seen by supervising physician. Plan is to Admit.  Disposition/Plan:  Admit Pt acknowledges and agrees with plan  Supervising Physician Laurence Spates, MD  Final diagnoses:  Urinary tract infection without hematuria, site unspecified    New Prescriptions New Prescriptions   No medications on file     Audry Pili, PA-C 12/10/16 1230    Laurence Spates, MD 12/11/16 2137

## 2016-12-10 NOTE — Evaluation (Signed)
Physical Therapy Evaluation Patient Details Name: Bryce Potter MRN: [id number] DOB: [birth year] Today's Date: 12/10/2016   History of Present Illness  Bryce Potter is a 83 y.o. male with medical history significant for arthritis, dementia, [name], hypertension, hypothyroidism,stroke, brought to the emergency department by his daughter, due to 1 to 2 day history of increased debilitation, and acute confusion.  Found to have UTI  Clinical Impression  Pt admitted with/for weakness and UTI.  Pt is at a max to total assist of 1 to 2 persons at this time for basic mobility.  Pt currently limited functionally due to the problems listed. ( See problems list.)   Pt will benefit from PT to maximize function and safety in order to get ready for next venue listed below.     Follow Up Recommendations SNF    Equipment Recommendations  None recommended by PT;Other (comment) (TBA)    Recommendations for Other Services       Precautions / Restrictions Precautions Precautions: Fall      Mobility  Bed Mobility Overal bed mobility: Needs Assistance Bed Mobility: Rolling;Sidelying to Sit;Sit to Supine Rolling: Max assist Sidelying to sit: Max assist;+2 for physical assistance   Sit to supine: Max assist   General bed mobility comments: Significant truncal assist and cues for guiding   Transfers Overall transfer level: Needs assistance   Transfers: Sit to/from Stand Sit to Stand: Max assist;+2 physical assistance         General transfer comment: cues for hand placement and significant assist to come forward and boost.  Ambulation/Gait             General Gait Details: not able  Stairs            Wheelchair Mobility    Modified Rankin (Stroke Patients Only)       Balance Overall balance assessment: Needs assistance Sitting-balance support: Bilateral upper extremity supported Sitting balance-Leahy Scale: Poor Sitting balance - Comments: falling posteriorly for majority  of 10 min EOB Postural control: Posterior lean Standing balance support: Bilateral upper extremity supported Standing balance-Leahy Scale: Zero Standing balance comment: assist to stay forward.  pt flexed forward and with flexed knees.                             Pertinent Vitals/Pain Pain Assessment: Faces Faces Pain Scale: No hurt    Home Living Family/patient expects to be discharged to:: Private residence Living Arrangements: Children Available Help at Discharge: Family;Available 24 hours/day (two daughters rotate nights.  1 dtr always there days) Type of Home: House Home Access: Stairs to enter Entrance Stairs-Rails: Doctor, general practice of Steps: 1/1 Home Layout: One level Home Equipment: Environmental consultant - 2 wheels;Bedside commode;Shower seat;Wheelchair - manual;Grab bars - tub/shower;Grab bars - toilet      Prior Function Level of Independence: Needs assistance   Gait / Transfers Assistance Needed: Lately at a transfer level only.  Has not walked significantly in 4-5 months and then with assist/RW and stooped posture  ADL's / Homemaking Assistance Needed: daughter does most ADLS        Hand Dominance        Extremity/Trunk Assessment   Upper Extremity Assessment Upper Extremity Assessment: Generalized weakness    Lower Extremity Assessment Lower Extremity Assessment: Generalized weakness    Cervical / Trunk Assessment Cervical / Trunk Assessment: Kyphotic  Communication   Communication: No difficulties  Cognition Arousal/Alertness: Lethargic;Awake/alert Behavior During Therapy: Flat affect Overall Cognitive  Status: History of cognitive impairments - at baseline                                        General Comments      Exercises     Assessment/Plan    PT Assessment Patient needs continued PT services  PT Problem List Decreased strength;Decreased activity tolerance;Decreased balance;Decreased mobility;Decreased  coordination;Decreased knowledge of use of DME       PT Treatment Interventions DME instruction;Gait training;Functional mobility training;Therapeutic activities;Therapeutic exercise;Balance training;Patient/family education    PT Goals (Current goals can be found in the Care Plan section)  Acute Rehab PT Goals Patient Stated Goal: daughter wants pt to move better and ultimately go home PT Goal Formulation: With patient/family Time For Goal Achievement: 12/24/16 Potential to Achieve Goals: Fair    Frequency Min 3X/week   Barriers to discharge        Co-evaluation               End of Session   Activity Tolerance: Patient limited by lethargy Patient left: in bed;with call bell/phone within reach;with bed alarm set;with family/visitor present Nurse Communication: Mobility status PT Visit Diagnosis: Unsteadiness on feet (R26.81);Other abnormalities of gait and mobility (R26.89);Muscle weakness (generalized) (M62.81)    Time: 1610-9604 PT Time Calculation (min) (ACUTE ONLY): 40 min   Charges:   PT Evaluation $PT Eval Moderate Complexity: 1 Procedure PT Treatments $Therapeutic Activity: 23-37 mins   PT G Codes:   PT G-Codes **NOT FOR INPATIENT CLASS** Functional Assessment Tool Used: AM-PAC 6 Clicks Basic Mobility;Clinical judgement Functional Limitation: Mobility: Walking and moving around Mobility: Walking and Moving Around Current Status (V4098): At least 40 percent but less than 60 percent impaired, limited or restricted Mobility: Walking and Moving Around Goal Status [phone]): At least 20 percent but less than 40 percent impaired, limited or restricted    12/10/2016  [place] Bing, PT [phone]  (pager)  Bryce Potter 12/10/2016, 5:19 PM

## 2016-12-10 NOTE — Progress Notes (Signed)
Admitted from E.D ,placed in telemetry bed,called and confirmed.No skin issues asper assessment with Vonna Kotyk.Generalized weakness but more on the right side due to history of CVA.Patient's daughter at the bedside.Fall prevention education rendered to patient and her daughter.Bed alarm set on.

## 2016-12-11 ENCOUNTER — Inpatient Hospital Stay (HOSPITAL_COMMUNITY): Payer: Medicare Other

## 2016-12-11 DIAGNOSIS — G459 Transient cerebral ischemic attack, unspecified: Secondary | ICD-10-CM | POA: Diagnosis not present

## 2016-12-11 DIAGNOSIS — R488 Other symbolic dysfunctions: Secondary | ICD-10-CM | POA: Diagnosis not present

## 2016-12-11 DIAGNOSIS — R7881 Bacteremia: Secondary | ICD-10-CM | POA: Diagnosis not present

## 2016-12-11 DIAGNOSIS — R402364 Coma scale, best motor response, obeys commands, 24 hours or more after hospital admission: Secondary | ICD-10-CM | POA: Diagnosis not present

## 2016-12-11 DIAGNOSIS — R1319 Other dysphagia: Secondary | ICD-10-CM | POA: Diagnosis not present

## 2016-12-11 DIAGNOSIS — E1122 Type 2 diabetes mellitus with diabetic chronic kidney disease: Secondary | ICD-10-CM | POA: Diagnosis present

## 2016-12-11 DIAGNOSIS — R41841 Cognitive communication deficit: Secondary | ICD-10-CM | POA: Diagnosis not present

## 2016-12-11 DIAGNOSIS — N390 Urinary tract infection, site not specified: Secondary | ICD-10-CM | POA: Diagnosis present

## 2016-12-11 DIAGNOSIS — R4182 Altered mental status, unspecified: Secondary | ICD-10-CM | POA: Diagnosis not present

## 2016-12-11 DIAGNOSIS — R278 Other lack of coordination: Secondary | ICD-10-CM | POA: Diagnosis not present

## 2016-12-11 DIAGNOSIS — B9620 Unspecified Escherichia coli [E. coli] as the cause of diseases classified elsewhere: Secondary | ICD-10-CM | POA: Diagnosis present

## 2016-12-11 DIAGNOSIS — R627 Adult failure to thrive: Secondary | ICD-10-CM

## 2016-12-11 DIAGNOSIS — R29726 NIHSS score 26: Secondary | ICD-10-CM | POA: Diagnosis not present

## 2016-12-11 DIAGNOSIS — K219 Gastro-esophageal reflux disease without esophagitis: Secondary | ICD-10-CM | POA: Diagnosis present

## 2016-12-11 DIAGNOSIS — R05 Cough: Secondary | ICD-10-CM | POA: Diagnosis not present

## 2016-12-11 DIAGNOSIS — R531 Weakness: Secondary | ICD-10-CM

## 2016-12-11 DIAGNOSIS — M1990 Unspecified osteoarthritis, unspecified site: Secondary | ICD-10-CM | POA: Diagnosis present

## 2016-12-11 DIAGNOSIS — E039 Hypothyroidism, unspecified: Secondary | ICD-10-CM | POA: Diagnosis not present

## 2016-12-11 DIAGNOSIS — I129 Hypertensive chronic kidney disease with stage 1 through stage 4 chronic kidney disease, or unspecified chronic kidney disease: Secondary | ICD-10-CM | POA: Diagnosis present

## 2016-12-11 DIAGNOSIS — G8191 Hemiplegia, unspecified affecting right dominant side: Secondary | ICD-10-CM | POA: Diagnosis not present

## 2016-12-11 DIAGNOSIS — I459 Conduction disorder, unspecified: Secondary | ICD-10-CM | POA: Diagnosis not present

## 2016-12-11 DIAGNOSIS — R2689 Other abnormalities of gait and mobility: Secondary | ICD-10-CM | POA: Diagnosis not present

## 2016-12-11 DIAGNOSIS — G464 Cerebellar stroke syndrome: Secondary | ICD-10-CM | POA: Diagnosis not present

## 2016-12-11 DIAGNOSIS — R402234 Coma scale, best verbal response, inappropriate words, 24 hours or more after hospital admission: Secondary | ICD-10-CM | POA: Diagnosis not present

## 2016-12-11 DIAGNOSIS — R4701 Aphasia: Secondary | ICD-10-CM | POA: Diagnosis not present

## 2016-12-11 DIAGNOSIS — A4151 Sepsis due to Escherichia coli [E. coli]: Secondary | ICD-10-CM | POA: Diagnosis not present

## 2016-12-11 DIAGNOSIS — F0390 Unspecified dementia without behavioral disturbance: Secondary | ICD-10-CM | POA: Diagnosis present

## 2016-12-11 DIAGNOSIS — R402144 Coma scale, eyes open, spontaneous, 24 hours or more after hospital admission: Secondary | ICD-10-CM | POA: Diagnosis not present

## 2016-12-11 DIAGNOSIS — G9341 Metabolic encephalopathy: Secondary | ICD-10-CM | POA: Diagnosis present

## 2016-12-11 DIAGNOSIS — N183 Chronic kidney disease, stage 3 (moderate): Secondary | ICD-10-CM | POA: Diagnosis not present

## 2016-12-11 DIAGNOSIS — Z7982 Long term (current) use of aspirin: Secondary | ICD-10-CM | POA: Diagnosis not present

## 2016-12-11 DIAGNOSIS — R29810 Facial weakness: Secondary | ICD-10-CM | POA: Diagnosis not present

## 2016-12-11 DIAGNOSIS — M6281 Muscle weakness (generalized): Secondary | ICD-10-CM | POA: Diagnosis not present

## 2016-12-11 DIAGNOSIS — Z8673 Personal history of transient ischemic attack (TIA), and cerebral infarction without residual deficits: Secondary | ICD-10-CM | POA: Diagnosis not present

## 2016-12-11 DIAGNOSIS — R1312 Dysphagia, oropharyngeal phase: Secondary | ICD-10-CM | POA: Diagnosis not present

## 2016-12-11 DIAGNOSIS — R1310 Dysphagia, unspecified: Secondary | ICD-10-CM | POA: Diagnosis present

## 2016-12-11 DIAGNOSIS — I639 Cerebral infarction, unspecified: Secondary | ICD-10-CM | POA: Diagnosis not present

## 2016-12-11 DIAGNOSIS — I63412 Cerebral infarction due to embolism of left middle cerebral artery: Secondary | ICD-10-CM | POA: Diagnosis not present

## 2016-12-11 LAB — BLOOD CULTURE ID PANEL (REFLEXED)
ACINETOBACTER BAUMANNII: NOT DETECTED
CANDIDA ALBICANS: NOT DETECTED
Candida glabrata: NOT DETECTED
Candida krusei: NOT DETECTED
Candida parapsilosis: NOT DETECTED
Candida tropicalis: NOT DETECTED
Carbapenem resistance: NOT DETECTED
ENTEROBACTER CLOACAE COMPLEX: NOT DETECTED
ENTEROBACTERIACEAE SPECIES: DETECTED — AB
ESCHERICHIA COLI: DETECTED — AB
Enterococcus species: NOT DETECTED
HAEMOPHILUS INFLUENZAE: NOT DETECTED
Klebsiella oxytoca: NOT DETECTED
Klebsiella pneumoniae: NOT DETECTED
Listeria monocytogenes: NOT DETECTED
NEISSERIA MENINGITIDIS: NOT DETECTED
PSEUDOMONAS AERUGINOSA: NOT DETECTED
Proteus species: NOT DETECTED
STREPTOCOCCUS AGALACTIAE: NOT DETECTED
STREPTOCOCCUS PNEUMONIAE: NOT DETECTED
STREPTOCOCCUS PYOGENES: NOT DETECTED
STREPTOCOCCUS SPECIES: NOT DETECTED
Serratia marcescens: NOT DETECTED
Staphylococcus aureus (BCID): NOT DETECTED
Staphylococcus species: NOT DETECTED

## 2016-12-11 LAB — CBC
HEMATOCRIT: 32.9 % — AB (ref 39.0–52.0)
HEMOGLOBIN: 10.4 g/dL — AB (ref 13.0–17.0)
MCH: 29.2 pg (ref 26.0–34.0)
MCHC: 31.6 g/dL (ref 30.0–36.0)
MCV: 92.4 fL (ref 78.0–100.0)
Platelets: 215 10*3/uL (ref 150–400)
RBC: 3.56 MIL/uL — ABNORMAL LOW (ref 4.22–5.81)
RDW: 13.3 % (ref 11.5–15.5)
WBC: 15.9 10*3/uL — AB (ref 4.0–10.5)

## 2016-12-11 LAB — COMPREHENSIVE METABOLIC PANEL
ALBUMIN: 2.7 g/dL — AB (ref 3.5–5.0)
ALK PHOS: 55 U/L (ref 38–126)
ALT: 18 U/L (ref 17–63)
AST: 20 U/L (ref 15–41)
Anion gap: 8 (ref 5–15)
BILIRUBIN TOTAL: 0.4 mg/dL (ref 0.3–1.2)
BUN: 21 mg/dL — AB (ref 6–20)
CO2: 23 mmol/L (ref 22–32)
Calcium: 8.3 mg/dL — ABNORMAL LOW (ref 8.9–10.3)
Chloride: 111 mmol/L (ref 101–111)
Creatinine, Ser: 1.75 mg/dL — ABNORMAL HIGH (ref 0.61–1.24)
GFR calc Af Amer: 36 mL/min — ABNORMAL LOW (ref >60)
GFR calc non Af Amer: 31 mL/min — ABNORMAL LOW (ref >60)
GLUCOSE: 127 mg/dL — AB (ref 65–99)
POTASSIUM: 4.4 mmol/L (ref 3.5–5.1)
SODIUM: 142 mmol/L (ref 135–145)
TOTAL PROTEIN: 6.6 g/dL (ref 6.5–8.1)

## 2016-12-11 MED ORDER — METOPROLOL TARTRATE 5 MG/5ML IV SOLN
5.0000 mg | Freq: Once | INTRAVENOUS | Status: AC
  Administered 2016-12-11: 5 mg via INTRAVENOUS
  Filled 2016-12-11: qty 5

## 2016-12-11 MED ORDER — DEXTROSE 5 % IV SOLN
2.0000 g | INTRAVENOUS | Status: DC
  Administered 2016-12-11 – 2016-12-12 (×2): 2 g via INTRAVENOUS
  Filled 2016-12-11 (×2): qty 2

## 2016-12-11 MED ORDER — GUAIFENESIN-CODEINE 100-10 MG/5ML PO SOLN: 5.0000 mL | Freq: Once | ORAL | Status: DC

## 2016-12-11 NOTE — Progress Notes (Signed)
PROGRESS NOTE    Bryce Potter  ZOX:[id number]  DOB: 20-Sep-1918  DOA: 12/10/2016 PCP: Boneta Lucks, NP Outpatient Specialists:   Hospital course: Bryce Potter is a 83 y.o. male with medical history significant for arthritis, dementia, [name], hypertension, hypothyroidism,stroke, brought to the emergency department by his daughter, due to 1 to 2 day history of increased debilitation, and acute confusion.   Assessment & Plan:    Acute encephalopathy likely due to UTI suggested by UA + large leukocytes in urine. Cultures  Pending. LActic acid 1.52  WBC is 7.3  Ammonia pending. CT head neg for acute findings.   Afebrile. Lactic acid is normal  Received IV hydration at 100 cc/h   In ED.  Received IV Ceftriaxone x1    F/u urine culture Ceftriaxone IV   Follow CBC   Continue IVF   Gram negative bacteremia - increased ceftriaxone to 2 gm IV, awaiting C&S  Hypertension BP 132/71   Pulse 69  Controlled Continue home anti-hypertensive medications     Chronic kidney disease stage 3 baseline creatinine    Current Cr  Recent Labs       Lab Results  Component Value Date   CREATININE 1.73 (H) 12/10/2016   CREATININE 1.55 (H) 08/26/2013   CREATININE 1.80 (H) 08/25/2013     IVF Hold diuretics  Repeat CMET in am   Hypothyroidism: Continue home Synthroid  Deconditioning  PT/OT consult recommending SNF, spoke with daughter about this but she is undecided.    DVT prophylaxis: SCD's   Code Status:   Full    Family Communication:  Discussed with daughter Disposition Plan: TBD Admission status Medsurg  Subjective: Pt more alert this morning.   Objective: Vitals:   12/11/16 0055 12/11/16 0437 12/11/16 0849 12/11/16 0922  BP:  140/66 (!) 148/67 (!) 150/64  Pulse:  72 71 84  Resp:  20 (!) 23 20  Temp: 99.9 F (37.7 C) 98.8 F (37.1 C) 97.5 F (36.4 C)   TempSrc: Axillary Oral Oral   SpO2:  100% 100% 100%  Weight:      Height:        Intake/Output Summary  (Last 24 hours) at 12/11/16 1342 Last data filed at 12/11/16 8119  Gross per 24 hour  Intake              240 ml  Output             1250 ml  Net            -1010 ml   Filed Weights   12/10/16 1100 12/10/16 1900  Weight: 95.3 kg (210 lb) 86.5 kg (190 lb 11.2 oz)    Exam:  General exam: awake, alert, NAD Respiratory system:  No increased work of breathing. Cardiovascular system: S1 & S2 heard. Gastrointestinal system: Abdomen is nondistended, soft and nontender. Normal bowel sounds heard. Central nervous system: Alert. No focal neurological deficits. Extremities: no cyanosis.  Data Reviewed: Basic Metabolic Panel:  Recent Labs Lab 12/10/16 1103 12/11/16 0446  NA 141 142  K 4.4 4.4  CL 109 111  CO2 24 23  GLUCOSE 122* 127*  BUN 21* 21*  CREATININE 1.73* 1.75*  CALCIUM 9.0 8.3*   Liver Function Tests:  Recent Labs Lab 12/10/16 1103 12/11/16 0446  AST 18 20  ALT 21 18  ALKPHOS 57 55  BILITOT 0.4 0.4  PROT 7.6 6.6  ALBUMIN 3.2* 2.7*   No results for input(s): LIPASE, AMYLASE in the last 168 hours.  Recent Labs Lab 12/10/16 1421  AMMONIA 17   CBC:  Recent Labs Lab 12/10/16 1103 12/11/16 0446  WBC 7.3 15.9*  NEUTROABS 4.2  --   HGB 11.8* 10.4*  HCT 36.6* 32.9*  MCV 91.7 92.4  PLT 234 215   Cardiac Enzymes: No results for input(s): CKTOTAL, CKMB, CKMBINDEX, TROPONINI in the last 168 hours. CBG (last 3)  No results for input(s): GLUCAP in the last 72 hours. Recent Results (from the past 240 hour(s))  Urine culture     Status: Abnormal (Preliminary result)   Collection Time: 12/10/16 10:50 AM  Result Value Ref Range Status   Specimen Description URINE, CATHETERIZED  Final   Special Requests NONE  Final   Culture >=100,000 COLONIES/mL ESCHERICHIA COLI (A)  Final   Report Status PENDING  Incomplete  Culture, blood (Routine x 2)     Status: None (Preliminary result)   Collection Time: 12/10/16 11:06 AM  Result Value Ref Range Status   Specimen  Description BLOOD RIGHT ANTECUBITAL  Final   Special Requests   Final    BOTTLES DRAWN AEROBIC AND ANAEROBIC Blood Culture adequate volume   Culture  Setup Time   Final    GRAM NEGATIVE RODS ANAEROBIC BOTTLE ONLY Organism ID to follow CRITICAL RESULT CALLED TO, READ BACK BY AND VERIFIED WITH: JAMES LEDFORD, PHARMD  12/11/16 MKELLY,MLT    Culture NO GROWTH < 24 HOURS  Final   Report Status PENDING  Incomplete  Blood Culture ID Panel (Reflexed)     Status: Abnormal   Collection Time: 12/10/16 11:06 AM  Result Value Ref Range Status   Enterococcus species NOT DETECTED NOT DETECTED Final   Listeria monocytogenes NOT DETECTED NOT DETECTED Final   Staphylococcus species NOT DETECTED NOT DETECTED Final   Staphylococcus aureus NOT DETECTED NOT DETECTED Final   Streptococcus species NOT DETECTED NOT DETECTED Final   Streptococcus agalactiae NOT DETECTED NOT DETECTED Final   Streptococcus pneumoniae NOT DETECTED NOT DETECTED Final   Streptococcus pyogenes NOT DETECTED NOT DETECTED Final   Acinetobacter baumannii NOT DETECTED NOT DETECTED Final   Enterobacteriaceae species DETECTED (A) NOT DETECTED Final    Comment: Enterobacteriaceae represent a large family of gram-negative bacteria, not a single organism. CRITICAL RESULT CALLED TO, READ BACK BY AND VERIFIED WITH: JAMES LEDFORD, PHARMD  12/11/16 MKELLY,MLT    Enterobacter cloacae complex NOT DETECTED NOT DETECTED Final   Escherichia coli DETECTED (A) NOT DETECTED Final    Comment: CRITICAL RESULT CALLED TO, READ BACK BY AND VERIFIED WITH: JAMES LEDFORD, PHARMD  12/11/16 MKELLY,MLT    Klebsiella oxytoca NOT DETECTED NOT DETECTED Final   Klebsiella pneumoniae NOT DETECTED NOT DETECTED Final   Proteus species NOT DETECTED NOT DETECTED Final   Serratia marcescens NOT DETECTED NOT DETECTED Final   Carbapenem resistance NOT DETECTED NOT DETECTED Final   Haemophilus influenzae NOT DETECTED NOT DETECTED Final   Neisseria  meningitidis NOT DETECTED NOT DETECTED Final   Pseudomonas aeruginosa NOT DETECTED NOT DETECTED Final   Candida albicans NOT DETECTED NOT DETECTED Final   Candida glabrata NOT DETECTED NOT DETECTED Final   Candida krusei NOT DETECTED NOT DETECTED Final   Candida parapsilosis NOT DETECTED NOT DETECTED Final   Candida tropicalis NOT DETECTED NOT DETECTED Final  Culture, blood (Routine x 2)     Status: None (Preliminary result)   Collection Time: 12/10/16 11:16 AM  Result Value Ref Range Status   Specimen Description BLOOD LEFT ANTECUBITAL  Final   Special Requests IN PEDIATRIC BOTTLE  Blood Culture adequate volume  Final   Culture NO GROWTH < 24 HOURS  Final   Report Status PENDING  Incomplete     Studies: Dg Chest 2 View  Result Date: 12/10/2016 CLINICAL DATA:  Altered mental status, history of stroke EXAM: CHEST  2 VIEW COMPARISON:  07/06/2014 FINDINGS: Borderline cardiomegaly noted. No pulmonary edema. There is left basilar atelectasis or infiltrate. Degenerative changes thoracic spine again noted. IMPRESSION: No pulmonary edema. Left basilar atelectasis on infiltrate. Degenerative changes thoracic spine. Electronically Signed   By: Natasha Mead M.D.   On: 12/10/2016 12:05   Ct Head Wo Contrast  Result Date: 12/10/2016 CLINICAL DATA:  Altered mental status, increasing lethargy and weakness EXAM: CT HEAD WITHOUT CONTRAST TECHNIQUE: Contiguous axial images were obtained from the base of the skull through the vertex without intravenous contrast. COMPARISON:  CT brain scan of 08/25/2013 FINDINGS: Brain: Ventriculomegaly is unchanged as is diffuse cortical atrophy. The septum is midline in position. Moderately severe small vessel ischemic change throughout the periventricular white matter is unchanged as well. No hemorrhage, mass lesion, or acute infarction is seen. Vascular: No vascular abnormality is seen on this unenhanced study. Skull: No calvarial abnormality is seen. Sinuses/Orbits: There is  mild mucosal thickening within the maxillary sinuses but no present sinusitis is seen. Other: None. IMPRESSION: 1. Stable atrophy and significant small vessel ischemic change throughout the periventricular white matter. No acute intracranial abnormality. 2. Mild mucosal thickening in the maxillary sinuses. Electronically Signed   By: Dwyane Dee M.D.   On: 12/10/2016 11:53     Scheduled Meds: . aspirin EC  81 mg Oral Daily  . cefTRIAXone (ROCEPHIN)  IV  2 g Intravenous Q24H  . levothyroxine  75 mcg Oral QAC breakfast  . metoprolol tartrate  25 mg Oral BID  . pantoprazole  40 mg Oral Daily  . tamsulosin  0.4 mg Oral QPC supper   Continuous Infusions: . sodium chloride 100 mL/hr at 12/11/16 1610    Active Problems:   Weakness generalized   FTT (failure to thrive) in adult   CKD (chronic kidney disease), stage III   Hypothyroidism   UTI (urinary tract infection)   E coli bacteremia   Time spent:   Standley Dakins, MD, FAAFP [facility] Pager [phone]  If 7PM-7AM, please contact night-coverage [email or web address] Password [email or web address] 12/11/2016, 1:42 PM    LOS: 0 days

## 2016-12-11 NOTE — Significant Event (Addendum)
Rapid Response Event Note  Overview: Time Called: 2129 Arrival Time: 2130 Event Type: Respiratory  Initial Focused Assessment:  Called by Elnita Maxwell RN to evaluate for pt with persistent cough that began at approx 1900.  RN states family gave pt a cough drop after pt finished eating dinner.  Rn reports pt has been coughing since 1900.   On arrival pt appears in no acute distress.  He does have a dry non productive cough and complains of some shortness of breath.  Shallow respirations at times with weak cough.  Lung sounds on right side reveal mild rhonchi, clear on left. Pt is alert and oriented with L sided facial droop, per RN this is baseline from prior CVA.  Equal strength in all extremities.  Pupils equal and reactive. Vital signs WNL   Pt's skin is warm and dry   Interventions:   Elnita Maxwell RN received order from [facility] NP for CXR and cough syrup.  While assessing pt, pt showed signs of aspiration.  Text paged Burnadette Peter, NP and received order to make pt NPO with plan for swallow eval tomorrow.   Continuous pulse ox ordered. PO meds switched to IV per Burnadette Peter NP.    Plan of Care (if not transferred):   Pt appears comfortable, repositioned in bed upright. Instructed RN to call if pt condition declines.  Event Summary: Name of Physician Notified: Elray Mcgregor NP  at 2212    at    Outcome: Stayed in room and stabalized  Event End Time: 2220  Dan Maker

## 2016-12-11 NOTE — Progress Notes (Signed)
PHARMACY - PHYSICIAN COMMUNICATION CRITICAL VALUE ALERT - BLOOD CULTURE IDENTIFICATION (BCID)  Results for orders placed or performed during the hospital encounter of 12/10/16  Blood Culture ID Panel (Reflexed) (Collected: 12/10/2016 11:06 AM)  Result Value Ref Range   Enterococcus species NOT DETECTED NOT DETECTED   Listeria monocytogenes NOT DETECTED NOT DETECTED   Staphylococcus species NOT DETECTED NOT DETECTED   Staphylococcus aureus NOT DETECTED NOT DETECTED   Streptococcus species NOT DETECTED NOT DETECTED   Streptococcus agalactiae NOT DETECTED NOT DETECTED   Streptococcus pneumoniae NOT DETECTED NOT DETECTED   Streptococcus pyogenes NOT DETECTED NOT DETECTED   Acinetobacter baumannii NOT DETECTED NOT DETECTED   Enterobacteriaceae species DETECTED (A) NOT DETECTED   Enterobacter cloacae complex NOT DETECTED NOT DETECTED   Escherichia coli DETECTED (A) NOT DETECTED   Klebsiella oxytoca NOT DETECTED NOT DETECTED   Klebsiella pneumoniae NOT DETECTED NOT DETECTED   Proteus species NOT DETECTED NOT DETECTED   Serratia marcescens NOT DETECTED NOT DETECTED   Carbapenem resistance NOT DETECTED NOT DETECTED   Haemophilus influenzae NOT DETECTED NOT DETECTED   Neisseria meningitidis NOT DETECTED NOT DETECTED   Pseudomonas aeruginosa NOT DETECTED NOT DETECTED   Candida albicans NOT DETECTED NOT DETECTED   Candida glabrata NOT DETECTED NOT DETECTED   Candida krusei NOT DETECTED NOT DETECTED   Candida parapsilosis NOT DETECTED NOT DETECTED   Candida tropicalis NOT DETECTED NOT DETECTED    Name of physician (or Provider) Contacted: Donnamarie Poag ([facility])  Changes to prescribed antibiotics required: Increase ceftriaxone to 2g IV q24h  Abran Duke 12/11/2016  4:59 AM

## 2016-12-11 NOTE — Progress Notes (Signed)
Metabolic encephalopathy.

## 2016-12-11 NOTE — Progress Notes (Signed)
OT Cancellation Note  Patient Details Name: Bryce Potter MRN: [id number] DOB: 06-Jun-1919   Cancelled Treatment:    Reason Eval/Treat Not Completed: Fatigue/lethargy limiting ability to participate  Pt had just finished bath and was exhausted. Will check on pt later in day or next day.  [name], [place] [phone]  [name] 12/11/2016, 11:46 AM

## 2016-12-12 ENCOUNTER — Inpatient Hospital Stay (HOSPITAL_COMMUNITY): Payer: Medicare Other

## 2016-12-12 DIAGNOSIS — G459 Transient cerebral ischemic attack, unspecified: Secondary | ICD-10-CM | POA: Diagnosis not present

## 2016-12-12 LAB — CBC
HCT: 32.3 % — ABNORMAL LOW (ref 39.0–52.0)
HEMOGLOBIN: 10.4 g/dL — AB (ref 13.0–17.0)
MCH: 29.2 pg (ref 26.0–34.0)
MCHC: 32.2 g/dL (ref 30.0–36.0)
MCV: 90.7 fL (ref 78.0–100.0)
PLATELETS: 223 10*3/uL (ref 150–400)
RBC: 3.56 MIL/uL — AB (ref 4.22–5.81)
RDW: 12.8 % (ref 11.5–15.5)
WBC: 8.5 10*3/uL (ref 4.0–10.5)

## 2016-12-12 LAB — COMPREHENSIVE METABOLIC PANEL
ALBUMIN: 2.9 g/dL — AB (ref 3.5–5.0)
ALT: 17 U/L (ref 17–63)
AST: 23 U/L (ref 15–41)
Alkaline Phosphatase: 50 U/L (ref 38–126)
Anion gap: 8 (ref 5–15)
BUN: 19 mg/dL (ref 6–20)
CHLORIDE: 105 mmol/L (ref 101–111)
CO2: 24 mmol/L (ref 22–32)
CREATININE: 1.49 mg/dL — AB (ref 0.61–1.24)
Calcium: 8.5 mg/dL — ABNORMAL LOW (ref 8.9–10.3)
GFR calc non Af Amer: 38 mL/min — ABNORMAL LOW (ref >60)
GFR, EST AFRICAN AMERICAN: 44 mL/min — AB (ref >60)
GLUCOSE: 112 mg/dL — AB (ref 65–99)
Potassium: 3.8 mmol/L (ref 3.5–5.1)
SODIUM: 137 mmol/L (ref 135–145)
Total Bilirubin: 0.7 mg/dL (ref 0.3–1.2)
Total Protein: 6.9 g/dL (ref 6.5–8.1)

## 2016-12-12 LAB — URINE CULTURE: Culture: >=100000 — AB

## 2016-12-12 MED ORDER — SODIUM CHLORIDE 0.9 % IV SOLN
INTRAVENOUS | Status: AC
  Administered 2016-12-13: 03:00:00 via INTRAVENOUS

## 2016-12-12 MED ORDER — PIPERACILLIN-TAZOBACTAM 3.375 G IVPB
3.3750 g | Freq: Three times a day (TID) | INTRAVENOUS | Status: DC
  Administered 2016-12-12 – 2016-12-14 (×7): 3.375 g via INTRAVENOUS
  Filled 2016-12-12 (×8): qty 50

## 2016-12-12 MED ORDER — ASPIRIN 300 MG RE SUPP
300.0000 mg | Freq: Every day | RECTAL | Status: DC
  Administered 2016-12-12 – 2016-12-14 (×3): 300 mg via RECTAL
  Filled 2016-12-12 (×4): qty 1

## 2016-12-12 MED ORDER — METOPROLOL TARTRATE 5 MG/5ML IV SOLN: 5.0000 mg | Freq: Four times a day (QID) | INTRAVENOUS | Status: DC | PRN

## 2016-12-12 MED ORDER — LEVOTHYROXINE SODIUM 100 MCG IV SOLR
37.5000 ug | Freq: Every day | INTRAVENOUS | Status: DC
  Administered 2016-12-13 – 2016-12-14 (×2): 37.5 ug via INTRAVENOUS
  Filled 2016-12-12 (×2): qty 5

## 2016-12-12 NOTE — Evaluation (Signed)
Clinical/Bedside Swallow Evaluation Patient Details  Name: Bryce Potter MRN: [id number] Date of Birth: [birth year]  Today's Date: 12/12/2016 Time: SLP Start Time (ACUTE ONLY): 1259 SLP Stop Time (ACUTE ONLY): 1322 SLP Time Calculation (min) (ACUTE ONLY): 23 min  Past Medical History:  Past Medical History:  Diagnosis Date  . Arthritis   . Dementia   . GERD (gastroesophageal reflux disease)   . Hypertension   . Hypothyroidism   . Stroke [facility]) in age 54's - 70's  . UTI (urinary tract infection) 12/10/2016   Past Surgical History:  Past Surgical History:  Procedure Laterality Date  . CATARACT EXTRACTION Bilateral    HPI:  Bryce Potter a 83 y.o.malewith medical history significant forarthritis, dementia, [name], hypertension, hypothyroidism,stroke, brought to the emergency department by his daughter, due to 1 to 2 day history of increased debilitation, and acute confusion. Chest x ray grossly clear. ST to evaluate swallow function.    Assessment / Plan / Recommendation Clinical Impression  Pt with significant lethargy this date (note suspected UTI/encephalopathy) and poor management of saliva with prominent right sided facial droop, right sided drooling, and right sided body leaning. Daughter present at bedside who reports some right sided weakness following past CVA (2004 left centrum) however that current function appears changed. Current neurological imaging without acute intracranial abnormality however concern for acute change as worsening weakness observed along with onset of significant oral motor right sided deficits. Notified MD of concerns. Recommend continue NPO with medicines via alternative means. ST to closely monitor for PO readiness.  SLP Visit Diagnosis: Dysphagia, oropharyngeal phase (R13.12)    Aspiration Risk  Severe aspiration risk;Risk for inadequate nutrition/hydration    Diet Recommendation   NPO  Medication Administration: Via alternative means    Other   Recommendations Oral Care Recommendations: Oral care QID   Follow up Recommendations        Frequency and Duration min 2x/week          Prognosis Prognosis for Safe Diet Advancement: Fair Barriers to Reach Goals: Severity of deficits;Time post onset      Swallow Study   General Date of Onset: 12/10/16 HPI: Bryce Potter a 83 y.o.malewith medical history significant forarthritis, dementia, Gerd, hypertension, hypothyroidism,stroke, brought to the emergency department by his daughter, due to 1 to 2 day history of increased debilitation, and acute confusion. Chest x ray grossly clear. ST to evaluate swallow function.  Type of Study: Bedside Swallow Evaluation Previous Swallow Assessment: none on file  Diet Prior to this Study: NPO Temperature Spikes Noted: No Respiratory Status: Room air History of Recent Intubation: No Behavior/Cognition: Lethargic/Drowsy;Doesn't follow directions Oral Cavity Assessment:  (drooling, poor management of saliva) Oral Care Completed by SLP: No Oral Cavity - Dentition: Dentures, top;Dentures, bottom Self-Feeding Abilities: Total assist Patient Positioning: Upright in bed Baseline Vocal Quality: Not observed Volitional Cough: Cognitively unable to elicit Volitional Swallow: Unable to elicit    Oral/Motor/Sensory Function Overall Oral Motor/Sensory Function: Moderate impairment Facial ROM: Reduced right;Suspected CN VII (facial) dysfunction Facial Symmetry: Abnormal symmetry right;Suspected CN VII (facial) dysfunction Facial Strength: Reduced right;Suspected CN VII (facial) dysfunction Facial Sensation: Reduced right;Suspected CN V (Trigeminal) dysfunction   Ice Chips Ice chips: Not tested   Thin Liquid Thin Liquid: Not tested    Nectar Thick Nectar Thick Liquid: Not tested   Honey Thick Honey Thick Liquid: Not tested   Puree Puree: Not tested   Solid   GO   Solid: Not tested       Marcene Duos MA,  CCC-SLP Acute Care Speech  Language Pathologist    Kennieth Rad 12/12/2016,1:51 PM

## 2016-12-12 NOTE — Progress Notes (Addendum)
12/12/2016 2:37 PM  CTSP with acute mental status change and pronounced facial droop and patient noted to be nonverbal and drooling.  SLP noted drooling and unsafe for any p.o. She called me.  Called a Code Stroke and met with neurologist.  CT was  Done and reviewed, no bleeding seen. Fortunately patient started to improve and family decided against tPA due to high risk of bleeding.  Because he is NPO, discussed with neurologist will do rectal aspirin for now, suspect this was a TIA as he is rapidly improving but will follow.  Await further recommendations from neurology team.  Will follow closely continue aspiration precautions.   Murvin Natal, MD

## 2016-12-12 NOTE — Progress Notes (Signed)
PROGRESS NOTE    Butler Vegh  ZOX:[id number]  DOB: [birth year]  DOA: 12/10/2016 PCP: Boneta Lucks, NP Outpatient Specialists:   Hospital course: Bryce Potter is a 83 y.o. male with medical history significant for arthritis, dementia, Bryce Potter, hypertension, hypothyroidism,stroke, brought to the emergency department by his daughter, due to 1 to 2 day history of increased debilitation, and acute confusion.   Assessment & Plan:    Acute metabolic encephalopathy likely due to UTI suggested by UA + large leukocytes in urine. Cultures  Pending. LActic acid 1.52  WBC is 7.3  CT head neg for acute findings.   Afebrile. Lactic acid is normal  Received IV hydration at 100 cc/h   In ED.   Clinically he is much improved.  E coli bacteremia - C&S from UA sensitive to all except cipro, blood culture C&S pending  Hypertension BP 132/71   Pulse 69  Controlled Continue home anti-hypertensive medications    Aspiration - Changed antibiotics to Zosyn given aspiration event, NPO for now until SLP evaluation.  Observe aspiration precautions.   Chronic kidney disease stage 3 baseline creatinine    Current Cr 1.49 improved with hydration Recent Labs       Lab Results  Component Value Date   CREATININE 1.73 (H) 12/10/2016   CREATININE 1.55 (H) 08/26/2013   CREATININE 1.80 (H) 08/25/2013     IVF Hold diuretics   Hypothyroidism: Continue home Synthroid  Deconditioning  PT/OT consult recommending SNF, spoke with daughter about this but she is undecided.   DVT prophylaxis: SCDs   Code Status:   Full    Family Communication:  Discussed with daughter Disposition Plan: TBD Admission status Medsurg  Subjective: Pt more alert this morning.  He is breathing well and not coughing.     Objective: Vitals:   12/11/16 2300 12/12/16 0456 12/12/16 0500 12/12/16 0855  BP:  (!) 163/74  (!) 168/72  Pulse:  87  90  Resp:  20  18  Temp:  99 F (37.2 C)  98.6 F (37 C)  TempSrc:  Oral  Oral    SpO2:  100%  97%  Weight: 91.8 kg (202 lb 6.1 oz)  89.9 kg (198 lb 3.2 oz)   Height:        Intake/Output Summary (Last 24 hours) at 12/12/16 1101 Last data filed at 12/12/16 0856  Gross per 24 hour  Intake          3306.67 ml  Output             2101 ml  Net          1205.67 ml   Filed Weights   12/10/16 1900 12/11/16 2300 12/12/16 0500  Weight: 86.5 kg (190 lb 11.2 oz) 91.8 kg (202 lb 6.1 oz) 89.9 kg (198 lb 3.2 oz)    Exam:  General exam: awake, alert, NAD Respiratory system:  No increased work of breathing. Cardiovascular system: S1 & S2 heard. Gastrointestinal system: Abdomen is nondistended, soft and nontender. Normal bowel sounds heard. Central nervous system: Alert. No focal neurological deficits. Extremities: no cyanosis.  Data Reviewed: Basic Metabolic Panel:  Recent Labs Lab 12/10/16 1103 12/11/16 0446 12/12/16 0923  NA 141 142 137  K 4.4 4.4 3.8  CL 109 111 105  CO2 GLUCOSE 122* 127* 112*  BUN 21* 21* 19  CREATININE 1.73* 1.75* 1.49*  CALCIUM 9.0 8.3* 8.5*   Liver Function Tests:  Recent Labs Lab 12/10/16 1103 12/11/16 0446 12/12/16 8119  AST ALT ALKPHOS 57 55 50  BILITOT 0.4 0.4 0.7  PROT 7.6 6.6 6.9  ALBUMIN 3.2* 2.7* 2.9*   No results for input(s): LIPASE, AMYLASE in the last 168 hours.  Recent Labs Lab 12/10/16 1421  AMMONIA 17   CBC:  Recent Labs Lab 12/10/16 1103 12/11/16 0446 12/12/16 0923  WBC 7.3 15.9* 8.5  NEUTROABS 4.2  --   --   HGB 11.8* 10.4* 10.4*  HCT 36.6* 32.9* 32.3*  MCV 91.7 92.4 90.7  PLT 234 215 223   Cardiac Enzymes: No results for input(s): CKTOTAL, CKMB, CKMBINDEX, TROPONINI in the last 168 hours. CBG (last 3)  No results for input(s): GLUCAP in the last 72 hours. Recent Results (from the past 240 hour(s))  Urine culture     Status: Abnormal   Collection Time: 12/10/16 10:50 AM  Result Value Ref Range Status   Specimen Description URINE, CATHETERIZED  Final    Special Requests NONE  Final   Culture >=100,000 COLONIES/mL ESCHERICHIA COLI (A)  Final   Report Status 12/12/2016 FINAL  Final   Organism ID, Bacteria ESCHERICHIA COLI (A)  Final      Susceptibility   Escherichia coli - MIC*    AMPICILLIN <=2 SENSITIVE Sensitive     CEFAZOLIN <=4 SENSITIVE Sensitive     CEFTRIAXONE <=1 SENSITIVE Sensitive     CIPROFLOXACIN >=4 RESISTANT Resistant     GENTAMICIN <=1 SENSITIVE Sensitive     IMIPENEM <=0.25 SENSITIVE Sensitive     NITROFURANTOIN <=16 SENSITIVE Sensitive     TRIMETH/SULFA <=20 SENSITIVE Sensitive     AMPICILLIN/SULBACTAM <=2 SENSITIVE Sensitive     PIP/TAZO <=4 SENSITIVE Sensitive     Extended ESBL NEGATIVE Sensitive     * >=100,000 COLONIES/mL ESCHERICHIA COLI  Culture, blood (Routine x 2)     Status: Abnormal (Preliminary result)   Collection Time: 12/10/16 11:06 AM  Result Value Ref Range Status   Specimen Description BLOOD RIGHT ANTECUBITAL  Final   Special Requests   Final    BOTTLES DRAWN AEROBIC AND ANAEROBIC Blood Culture adequate volume   Culture  Setup Time   Final    GRAM NEGATIVE RODS ANAEROBIC BOTTLE ONLY CRITICAL RESULT CALLED TO, READ BACK BY AND VERIFIED WITH: JAMES LEDFORD, PHARMD  12/11/16 MKELLY,MLT    Culture ESCHERICHIA COLI (A)  Final   Report Status PENDING  Incomplete  Blood Culture ID Panel (Reflexed)     Status: Abnormal   Collection Time: 12/10/16 11:06 AM  Result Value Ref Range Status   Enterococcus species NOT DETECTED NOT DETECTED Final   Listeria monocytogenes NOT DETECTED NOT DETECTED Final   Staphylococcus species NOT DETECTED NOT DETECTED Final   Staphylococcus aureus NOT DETECTED NOT DETECTED Final   Streptococcus species NOT DETECTED NOT DETECTED Final   Streptococcus agalactiae NOT DETECTED NOT DETECTED Final   Streptococcus pneumoniae NOT DETECTED NOT DETECTED Final   Streptococcus pyogenes NOT DETECTED NOT DETECTED Final   Acinetobacter baumannii NOT DETECTED NOT DETECTED Final    Enterobacteriaceae species DETECTED (A) NOT DETECTED Final    Comment: Enterobacteriaceae represent a large family of gram-negative bacteria, not a single organism. CRITICAL RESULT CALLED TO, READ BACK BY AND VERIFIED WITH: JAMES LEDFORD, PHARMD  12/11/16 MKELLY,MLT    Enterobacter cloacae complex NOT DETECTED NOT DETECTED Final   Escherichia coli DETECTED (A) NOT DETECTED Final    Comment: CRITICAL RESULT CALLED TO, READ BACK BY AND VERIFIED WITH: Abran Duke, PHARMD @  0451 12/11/16 MKELLY,MLT    Klebsiella oxytoca NOT DETECTED NOT DETECTED Final   Klebsiella pneumoniae NOT DETECTED NOT DETECTED Final   Proteus species NOT DETECTED NOT DETECTED Final   Serratia marcescens NOT DETECTED NOT DETECTED Final   Carbapenem resistance NOT DETECTED NOT DETECTED Final   Haemophilus influenzae NOT DETECTED NOT DETECTED Final   Neisseria meningitidis NOT DETECTED NOT DETECTED Final   Pseudomonas aeruginosa NOT DETECTED NOT DETECTED Final   Candida albicans NOT DETECTED NOT DETECTED Final   Candida glabrata NOT DETECTED NOT DETECTED Final   Candida krusei NOT DETECTED NOT DETECTED Final   Candida parapsilosis NOT DETECTED NOT DETECTED Final   Candida tropicalis NOT DETECTED NOT DETECTED Final  Culture, blood (Routine x 2)     Status: None (Preliminary result)   Collection Time: 12/10/16 11:16 AM  Result Value Ref Range Status   Specimen Description BLOOD LEFT ANTECUBITAL  Final   Special Requests IN PEDIATRIC BOTTLE Blood Culture adequate volume  Final   Culture NO GROWTH < 24 HOURS  Final   Report Status PENDING  Incomplete     Studies: Dg Chest 2 View  Result Date: 12/10/2016 CLINICAL DATA:  Altered mental status, history of stroke EXAM: CHEST  2 VIEW COMPARISON:  07/06/2014 FINDINGS: Borderline cardiomegaly noted. No pulmonary edema. There is left basilar atelectasis or infiltrate. Degenerative changes thoracic spine again noted. IMPRESSION: No pulmonary edema. Left basilar  atelectasis on infiltrate. Degenerative changes thoracic spine. Electronically Signed   By: Natasha Mead M.D.   On: 12/10/2016 12:05   Ct Head Wo Contrast  Result Date: 12/10/2016 CLINICAL DATA:  Altered mental status, increasing lethargy and weakness EXAM: CT HEAD WITHOUT CONTRAST TECHNIQUE: Contiguous axial images were obtained from the base of the skull through the vertex without intravenous contrast. COMPARISON:  CT brain scan of 08/25/2013 FINDINGS: Brain: Ventriculomegaly is unchanged as is diffuse cortical atrophy. The septum is midline in position. Moderately severe small vessel ischemic change throughout the periventricular white matter is unchanged as well. No hemorrhage, mass lesion, or acute infarction is seen. Vascular: No vascular abnormality is seen on this unenhanced study. Skull: No calvarial abnormality is seen. Sinuses/Orbits: There is mild mucosal thickening within the maxillary sinuses but no present sinusitis is seen. Other: None. IMPRESSION: 1. Stable atrophy and significant small vessel ischemic change throughout the periventricular white matter. No acute intracranial abnormality. 2. Mild mucosal thickening in the maxillary sinuses. Electronically Signed   By: Dwyane Dee M.D.   On: 12/10/2016 11:53   Dg Chest Port 1 View  Result Date: 12/11/2016 CLINICAL DATA:  Acute onset of cough.  Initial encounter. EXAM: PORTABLE CHEST 1 VIEW COMPARISON:  Chest radiograph performed 12/10/2016 FINDINGS: The lungs are well-aerated and clear. There is no evidence of focal opacification, pleural effusion or pneumothorax. The cardiomediastinal silhouette is enlarged. No acute osseous abnormalities are seen. IMPRESSION: Cardiomegaly.  Lungs remain grossly clear. Electronically Signed   By: Roanna Raider M.D.   On: 12/11/2016 23:43     Scheduled Meds: . aspirin EC  81 mg Oral Daily  . levothyroxine  75 mcg Oral QAC breakfast  . metoprolol tartrate  25 mg Oral BID  . pantoprazole  40 mg Oral Daily   . piperacillin-tazobactam (ZOSYN)  IV  3.375 g Intravenous Q8H  . tamsulosin  0.4 mg Oral QPC supper   Continuous Infusions: . sodium chloride 60 mL/hr at 12/11/16 2026    Active Problems:   Weakness generalized   FTT (failure to thrive)  in adult   CKD (chronic kidney disease), stage III   Hypothyroidism   UTI (urinary tract infection)   E coli bacteremia  Time spent:   Standley Dakins, MD, FAAFP [facility] Pager [phone]  If 7PM-7AM, please contact night-coverage [email or web address] Password [email or web address] 12/12/2016, 11:01 AM    LOS: 1 day

## 2016-12-12 NOTE — Significant Event (Signed)
Rapid Response Event Note  Overview:  Called by RN for Code stroke activation Time Called: 3750 Arrival Time: 5107 Event Type: Neurologic  Initial Focused Assessment:  Called by Shawn Route RN for code stroke activation.  MD at bedside.  LSN 1200, patient is noted to be aphasic, with increased facial droop and drooling.   Interventions: Dr. Patrcia Dolly and RRT RN met patient in CT scan.  NIHSS 26 originally after CT scan and on return to room Patient improving.  NIHSS 11.  It was decided by Family and MD's to not treat patient with TPA  Plan of Care (if not transferred):  RN to monitor patient and call if assistance needed  Event Summary:     at    Outcome: Stayed in room and stabalized    Kennedy Bucker

## 2016-12-12 NOTE — Progress Notes (Signed)
Pharmacy Antibiotic Note  Bryce Potter is a 83 y.o. male admitted on 12/10/2016 with UTI and E Coli bacteremia. Patient was on ceftriaxone, which is being changed to zosyn due to concern of aspiration. Rapid response was called 4/13 evening due to persistent coughing.   Patient is currently afebrile with elevated WBC at 15.9. Serum creatinine elevated at 1.75, which appears to be around baseline. Estimated CrCL ~28 mL/min. E coli in urine pan-sensitive except to cipro, and E Coli in blood culture sensitivities are pending.  Plan: Zosyn 3.375gm IV every 8 hours (extended infusion) Monitor renal function, clinical progress F/U BCx E Coli sensitivities   Height:  (185.4 cm) Weight: 198 lb 3.2 oz (89.9 kg) IBW/kg (Calculated) : 79.9  Temp (24hrs), Avg:98.9 F (37.2 C), Min:98.6 F (37 C), Max:99.1 F (37.3 C)   Recent Labs Lab 12/10/16 1103 12/10/16 1129 12/10/16 2145 12/11/16 0446  WBC 7.3  --   --  15.9*  CREATININE 1.73*  --   --  1.75*  LATICACIDVEN  --  1.52 2.1*  --     Estimated Creatinine Clearance: 27.9 mL/min (A) (by C-G formula based on SCr of 1.75 mg/dL (H)).    No Known Allergies  Antimicrobials this admission: CTX 4/13 >> 4/14 Zosyn 4/13 >>  Microbiology results: 4/12 urine - > 100K/ml E coli  4/12 blood x 2 - 1 of 2 with E coli, 2nd with ng < 24 hrs   4/12 BCID: E coli  Thank you for allowing pharmacy to be a part of this patient's care.  Carylon Perches, PharmD Acute Care Pharmacy Resident  Pager: [phone] 12/12/2016

## 2016-12-12 NOTE — Consult Note (Signed)
Reason for Consult: Code Stroke Referring Physician: Dr. Sandi Mealy Bryce Potter is an 83 y.o. male.  HPI: This patient was admitted with E. Coli UTI and bacteremia and associated encephalopathy.  However, he was ambulatory with walker and talking normally at baseline.  Around 12 noon, he was found to be drooling, more right facial droop and weakness in right side associated with reduced level of alertness and global aphasia.  CT Brain showed no hemorrhage.  There was extensive small vessel ischemic disease and old subcortical infarcts.  He was evaluated around 2 pm and discussion with 2 daughters was undertaken.  During this discussion regarding risks of tPA, the patient spontaneously got better and gradually returned to close to baseline.  Although he had no IV tPA contraindications, it was aborted since he improved rapidly.   Due to dysphagia he was not receiving oral medications.    Past Medical History:  Diagnosis Date  . Arthritis   . Dementia   . GERD (gastroesophageal reflux disease)   . Hypertension   . Hypothyroidism   . Stroke [facility]) in age 16's - 28's  . UTI (urinary tract infection) 12/10/2016    Past Surgical History:  Procedure Laterality Date  . CATARACT EXTRACTION Bilateral     History reviewed. No pertinent family history.  Social History:  reports that he has never smoked. He has never used smokeless tobacco. He reports that he does not drink alcohol or use drugs.  Allergies: No Known Allergies  Prior to Admission medications   Medication Sig Start Date End Date Taking? Authorizing Provider  aspirin EC 81 MG tablet Take 81 mg by mouth daily.   Yes Historical Provider, MD  dextromethorphan-guaiFENesin (MUCINEX DM) 30-600 MG per 12 hr tablet Take 1 tablet by mouth 2 (two) times daily. 08/26/13  Yes Estela Leonie Green, MD  levothyroxine (SYNTHROID, LEVOTHROID) 75 MCG tablet Take 75 mcg by mouth daily before breakfast.   Yes Historical Provider, MD  metoprolol  tartrate (LOPRESSOR) 25 MG tablet Take 25 mg by mouth 2 (two) times daily.   Yes Historical Provider, MD  omega-3 acid ethyl esters (LOVAZA) 1 G capsule Take 1 g by mouth daily.   Yes Historical Provider, MD  omeprazole (PRILOSEC) 40 MG capsule Take 40 mg by mouth daily.    Yes Historical Provider, MD  tamsulosin (FLOMAX) 0.4 MG CAPS capsule Take 0.4 mg by mouth daily after supper.   Yes Historical Provider, MD    Medications:  Scheduled: . aspirin  300 mg Rectal Daily  . [START ON 12/13/2016] levothyroxine  37.5 mcg Intravenous QAC breakfast  . pantoprazole  40 mg Oral Daily  . piperacillin-tazobactam (ZOSYN)  IV  3.375 g Intravenous Q8H  . tamsulosin  0.4 mg Oral QPC supper    Results for orders placed or performed during the hospital encounter of 12/10/16 (from the past 48 hour(s))  Lactic acid, plasma     Status: Abnormal   Collection Time: 12/10/16  9:45 PM  Result Value Ref Range   Lactic Acid, Venous 2.1 (HH) 0.5 - 1.9 mmol/L    Comment: CRITICAL RESULT CALLED TO, READ BACK BY AND VERIFIED WITH: DAVY T,RN 12/10/16 2303 WAYK   CBC     Status: Abnormal   Collection Time: 12/11/16  4:46 AM  Result Value Ref Range   WBC 15.9 (H) 4.0 - 10.5 K/uL   RBC 3.56 (L) 4.22 - 5.81 MIL/uL   Hemoglobin 10.4 (L) 13.0 - 17.0 g/dL   HCT 32.9 (  L) 39.0 - 52.0 %   MCV 92.4 78.0 - 100.0 fL   MCH 29.2 26.0 - 34.0 pg   MCHC 31.6 30.0 - 36.0 g/dL   RDW 13.3 11.5 - 15.5 %   Platelets 215 150 - 400 K/uL  Comprehensive metabolic panel     Status: Abnormal   Collection Time: 12/11/16  4:46 AM  Result Value Ref Range   Sodium 142 135 - 145 mmol/L   Potassium 4.4 3.5 - 5.1 mmol/L   Chloride 111 101 - 111 mmol/L   CO2 23 22 - 32 mmol/L   Glucose, Bld 127 (H) 65 - 99 mg/dL   BUN 21 (H) 6 - 20 mg/dL   Creatinine, Ser 1.75 (H) 0.61 - 1.24 mg/dL   Calcium 8.3 (L) 8.9 - 10.3 mg/dL   Total Protein 6.6 6.5 - 8.1 g/dL   Albumin 2.7 (L) 3.5 - 5.0 g/dL   AST 20 15 - 41 U/L   ALT 18 17 - 63 U/L    Alkaline Phosphatase 55 38 - 126 U/L   Total Bilirubin 0.4 0.3 - 1.2 mg/dL   GFR calc non Af Amer 31 (L) >60 mL/min   GFR calc Af Amer 36 (L) >60 mL/min    Comment: (NOTE) The eGFR has been calculated using the CKD EPI equation. This calculation has not been validated in all clinical situations. eGFR's persistently <60 mL/min signify possible Chronic Kidney Disease.    Anion gap 8 5 - 15  CBC     Status: Abnormal   Collection Time: 12/12/16  9:23 AM  Result Value Ref Range   WBC 8.5 4.0 - 10.5 K/uL   RBC 3.56 (L) 4.22 - 5.81 MIL/uL   Hemoglobin 10.4 (L) 13.0 - 17.0 g/dL   HCT 32.3 (L) 39.0 - 52.0 %   MCV 90.7 78.0 - 100.0 fL   MCH 29.2 26.0 - 34.0 pg   MCHC 32.2 30.0 - 36.0 g/dL   RDW 12.8 11.5 - 15.5 %   Platelets 223 150 - 400 K/uL  Comprehensive metabolic panel     Status: Abnormal   Collection Time: 12/12/16  9:23 AM  Result Value Ref Range   Sodium 137 135 - 145 mmol/L   Potassium 3.8 3.5 - 5.1 mmol/L   Chloride 105 101 - 111 mmol/L   CO2 24 22 - 32 mmol/L   Glucose, Bld 112 (H) 65 - 99 mg/dL   BUN 19 6 - 20 mg/dL   Creatinine, Ser 1.49 (H) 0.61 - 1.24 mg/dL   Calcium 8.5 (L) 8.9 - 10.3 mg/dL   Total Protein 6.9 6.5 - 8.1 g/dL   Albumin 2.9 (L) 3.5 - 5.0 g/dL   AST 23 15 - 41 U/L   ALT 17 17 - 63 U/L   Alkaline Phosphatase 50 38 - 126 U/L   Total Bilirubin 0.7 0.3 - 1.2 mg/dL   GFR calc non Af Amer 38 (L) >60 mL/min   GFR calc Af Amer 44 (L) >60 mL/min    Comment: (NOTE) The eGFR has been calculated using the CKD EPI equation. This calculation has not been validated in all clinical situations. eGFR's persistently <60 mL/min signify possible Chronic Kidney Disease.    Anion gap 8 5 - 15    Dg Chest Port 1 View  Result Date: 12/11/2016 CLINICAL DATA:  Acute onset of cough.  Initial encounter. EXAM: PORTABLE CHEST 1 VIEW COMPARISON:  Chest radiograph performed 12/10/2016 FINDINGS: The lungs are well-aerated and clear. There  is no evidence of focal opacification,  pleural effusion or pneumothorax. The cardiomediastinal silhouette is enlarged. No acute osseous abnormalities are seen. IMPRESSION: Cardiomegaly.  Lungs remain grossly clear. Electronically Signed   By: Garald Balding M.D.   On: 12/11/2016 23:43   Ct Head Code Stroke Wo Contrast`  Result Date: 12/12/2016 CLINICAL DATA:  Code stroke. Altered mental status. Right-sided facial droop and drooling. The patient is leaning to the right. EXAM: CT HEAD WITHOUT CONTRAST TECHNIQUE: Contiguous axial images were obtained from the base of the skull through the vertex without intravenous contrast. COMPARISON:  CT head without contrast 12/10/2016 and 08/25/2013. FINDINGS: Brain: Passed atrophy diffuse white matter disease is again seen. There may be subtle loss of gray-white differentiation along the left insular cortex compared to the prior studies. The basal ganglia are intact. Remote lacunar infarcts of the basal ganglia are stable. No acute hemorrhage is present. The ventricles are proportionate to the degree of atrophy and stable. No significant extra-axial fluid collection is present. Vascular: The left M1 segment is slightly hyperdense compared to the other vessels. This could represent thrombus. Atherosclerotic calcifications are again noted within the cavernous segments bilaterally. Skull: Calvarium is intact. No focal lytic or blastic lesions are present. Sinuses/Orbits: The paranasal sinuses and mastoid air cells are clear. Bilateral lens replacements are present. The globes and orbits are otherwise intact. ASPECTS [facility] Stroke Program Early CT Score) - Ganglionic level infarction (caudate, lentiform nuclei, internal capsule, insula, M1-M3 cortex): 6/7 - Supraganglionic infarction (M4-M6 cortex): 3/3 Total score (0-10 with 10 being normal): 9/10 IMPRESSION: 1. Possible developing nonhemorrhagic left MCA territory infarct with subtle change in gray-white differentiation along the insular cortex and asymmetric  hyperdense left M1 segments suggesting possible large vessel occlusion. CTA of the head and neck would be useful. CT perfusion could be utilized for evaluation of cerebral ischemia if indicated. 2. ASPECTS is 9/10 3. Advanced atrophy and diffuse white matter disease is otherwise stable. 4. Remote lacunar infarcts are noted within the basal ganglia bilaterally. These results were called by telephone at the time of interpretation on 12/12/2016 at 2:31 pm to Dr. Orlena Sheldon, who verbally acknowledged these results. Electronically Signed   By: San Morelle M.D.   On: 12/12/2016 14:32    ROS Blood pressure (!) 168/72, pulse 90, temperature 98.6 F (37 C), temperature source Oral, resp. rate 18, height _0  (1.854 m), weight 89.9 kg (198 lb 3.2 oz), SpO2 97 %. Neurologic Examination:  More awake and alert than initial evaluaton. Some verbal output. Right facial droop (also present before ). Moving all extremities.    Assessment/Plan:  Transient ischemic attack.  No IV tPA given due to rapid improvement in deficits.   Ideally, I would like to load with Plavix 300 mg then 75 mg/day from tomorrow, but speech therapy has not cleared him for oral intake so will defer to ASA PR 300 mg qd as before.  Allow permissive hypertension for the next 24 hours and treat only if SBP >220 or DBP >120.  Ensure other risk factor modifications.    Rogue Jury, MD 12/12/2016, 3:41 PM

## 2016-12-13 ENCOUNTER — Inpatient Hospital Stay (HOSPITAL_COMMUNITY): Payer: Medicare Other

## 2016-12-13 DIAGNOSIS — I639 Cerebral infarction, unspecified: Secondary | ICD-10-CM

## 2016-12-13 DIAGNOSIS — G459 Transient cerebral ischemic attack, unspecified: Secondary | ICD-10-CM

## 2016-12-13 LAB — CULTURE, BLOOD (ROUTINE X 2): Special Requests: ADEQUATE

## 2016-12-13 MED ORDER — HEPARIN SODIUM (PORCINE) 5000 UNIT/ML IJ SOLN
5000.0000 [IU] | Freq: Three times a day (TID) | INTRAMUSCULAR | Status: DC
  Administered 2016-12-13 – 2016-12-15 (×7): 5000 [IU] via SUBCUTANEOUS
  Filled 2016-12-13 (×6): qty 1

## 2016-12-13 MED ORDER — STROKE: EARLY STAGES OF RECOVERY BOOK
Freq: Once | Status: AC
  Administered 2016-12-13: 18:00:00
  Filled 2016-12-13 (×2): qty 1

## 2016-12-13 NOTE — Progress Notes (Signed)
STROKE TEAM PROGRESS NOTE   HISTORY OF PRESENT ILLNESS (per record) Bryce Potter is an 83 y.o. male admitted with an E. Coli UTI and bacteremia with associated encephalopathy. He was ambulatory with a walker and talking normally at baseline.  Around 12 noon, he was found to be drooling, more right facial droop and weakness in right side associated with reduced level of alertness and global aphasia. CT Brain showed no hemorrhage.  There was extensive small vessel ischemic disease and old subcortical infarcts. He was evaluated around 2 pm and discussion with 2 daughters was undertaken.  During this discussion regarding risks of tPA, the patient spontaneously got better and gradually returned to close to baseline.  Although he had no IV tPA contraindications, it was aborted since he improved rapidly. Due to dysphagia he was not receiving oral medications.     SUBJECTIVE (INTERVAL HISTORY) His daughter is at the bedside.  He had transient right face droop and weakness y`day which has resolved.He denies p/h/o of TIA or stroke  OBJECTIVE Temp:  [97.9 F (36.6 C)-98.9 F (37.2 C)] 98.8 F (37.1 C) (04/15 0925) Pulse Rate:  [78-81] 81 (04/15 0925) Cardiac Rhythm: Normal sinus rhythm (04/15 0800) Resp:  [18-19] 18 (04/15 0925) BP: (132-163)/(7-60) 134/57 (04/15 0925) SpO2:  [97 %-98 %] 98 % (04/15 0925) Weight:  [88.5 kg (195 lb 3.2 oz)] 88.5 kg (195 lb 3.2 oz) (04/14 2041)  CBC:   Recent Labs Lab 12/10/16 1103 12/11/16 0446 12/12/16 0923  WBC 7.3 15.9* 8.5  NEUTROABS 4.2  --   --   HGB 11.8* 10.4* 10.4*  HCT 36.6* 32.9* 32.3*  MCV 91.7 92.4 90.7  PLT 234 215 223    Basic Metabolic Panel:   Recent Labs Lab 12/11/16 0446 12/12/16 0923  NA 142 137  K 4.4 3.8  CL 111 105  CO2 23 24  GLUCOSE 127* 112*  BUN 21* 19  CREATININE 1.75* 1.49*  CALCIUM 8.3* 8.5*    Lipid Panel: No results found for: CHOL, TRIG, HDL, CHOLHDL, VLDL, LDLCALC HgbA1c: No results found for: HGBA1C Urine  Drug Screen: No results found for: LABOPIA, COCAINSCRNUR, LABBENZ, AMPHETMU, THCU, LABBARB  Alcohol Level No results found for: [facility]  IMAGING  Dg Chest Port 1 View 12/11/2016 Cardiomegaly.  Lungs remain grossly clear.    Ct Head Code Stroke Wo Contrast` 12/12/2016 1. Possible developing nonhemorrhagic left MCA territory infarct with subtle change in gray-white differentiation along the insular cortex and asymmetric hyperdense left M1 segments suggesting possible large vessel occlusion. CTA of the head and neck would be useful. CT perfusion could be utilized for evaluation of cerebral ischemia if indicated.  2. ASPECTS is 9/10  3. Advanced atrophy and diffuse white matter disease is otherwise stable.  4. Remote lacunar infarcts are noted within the basal ganglia bilaterally.     PHYSICAL EXAM Frail elderly african [place] male not in distress. . Afebrile. Head is nontraumatic. Neck is supple without bruit.    Cardiac exam no murmur or gallop. Lungs are clear to auscultation. Distal pulses are well felt. Neurological Exam :  Sleepy but arouses easily and follows commands well.eye movements are full range. Blinks to threat bilaterally.right lower face weakness. Mild right grip weakness Tongue midline. Motor  System exam shows no significant drift. No focal weakness. Sensation intact. Gait deferred.     ASSESSMENT/PLAN Bryce Potter is a 83 y.o. male with history of previous stroke, hypothyroidism, hypertension, dementia, E. Coli UTI, bacteremia, and associated encephalopathy presenting with  right-sided weakness with reduced level of alertness and global aphasia.  He did not receive IV t-PA due to improving deficits.  Stroke:  left MCA territory infarct possibly embolic from an unknown source.  Resultant  Mild right face droop  MRI - pending  MRA - pending  Carotid Doppler - pending  2D Echo - pending  LDL - pending  HgbA1c - pending  VTE prophylaxis - subcutaneous  heparin Diet NPO time specified  aspirin 81 mg daily prior to admission, now on aspirin 300 mg suppository daily  Patient counseled to be compliant with his antithrombotic medications  Ongoing aggressive stroke risk factor management  Therapy recommendations: pending  Disposition: Pending  Hypertension  Stable  Permissive hypertension (OK if < 220/120) but gradually normalize in 5-7 days  Long-term BP goal normotensive  Hyperlipidemia  Home meds:  No statin medication prior to admission.  LDL pending, goal < 70    Other Stroke Risk Factors  Advanced age  Hx stroke/TIA   Other Active Problems  Elevated creatinine - 1.75 -> 1.49  Anemia - 10.4 / 32.3  Dementia  NPO  UTI - Zosyn started 12/12/2016   PLAN  Change aspirin to Plavix when PO access is available.    Hospital day # 2  I have personally examined this patient, reviewed notes, independently viewed imaging studies, participated in medical decision making and plan of care.ROS completed by me personally and pertinent positives fully documented  I have made any additions or clarifications directly to the above note.  He had right facial droop and hemiparesis which has almost completely resolved. Suspect small left brain subcortical infarct.recommend check MRI scan of the brain, MRA  brain,echo, carotid doppler, lipids and HbA1c. Continue rectal aspirin daily able to swallow dental change to Plavix. Discuss with Dr. Laural Benes and daughter.Greater than 50% time during this 35 minute visit was spent on counselling and coordination of care about stroke, prevention and treatment discussion.Delia Heady, MD Medical Director [facility] Pager: [phone] 12/13/2016 6:06 PM   To contact Stroke Continuity provider, please refer to [email or web address]. After hours, contact General Neurology

## 2016-12-13 NOTE — Progress Notes (Signed)
12/13/2016 11:33 AM.  I spoke with Dr. Pearlean Brownie with the stroke team, he recommends to start plavix when patient passes swallow evaluation and discontinue aspirin at that time.    Maryln Manuel, MD

## 2016-12-13 NOTE — Progress Notes (Signed)
  Speech Language Pathology Treatment: Dysphagia  Patient Details Name: Bryce Potter MRN: [id number] DOB: 1919/04/10 Today's Date: 12/13/2016 Time: 4098-1191 SLP Time Calculation (min) (ACUTE ONLY): 20 min  Assessment / Plan / Recommendation Clinical Impression  Patient seen for dysphagia treatment upon return from MRI, which was limited and discontinued prematurely due to patient agitation. Showed scattered small acute lacunar infarcts in the right corona radiata and posterior right deep gray matter nuclei. Pt pleasant and cooperative, daughters present. Voice wet, frequent coughing suggestive of aspiration of secretions. Pt with poor oral containment of saliva, requires verbal cues to swallow saliva. Pt tolerated ice chips without coughing, however observed with prolonged oral holding and increasingly wet vocal quality, suggestive of reduced airway protection. Immediate cough with teaspoon trials of water, puree. Spoke with pt and family at length regarding risks for aspiration, preventative oral care, and general goals of care. Daughter states, "I don't want him to have a feeding tube." SLP provided education re: treatment options including aggressive and comfort measures, objective assessment of swallowing function to help guide goals of care. At this time recommend NPO; SLP will follow closely. Patient may benefit from [facility] to aid in determining safest, least restrictive diet and to guide goals of care. Palliative consult may be beneficial.     HPI HPI: Bryce Potter a 83 y.o.malewith medical history significant forarthritis, dementia, [name], hypertension, hypothyroidism,stroke, brought to the emergency department by his daughter, due to 1 to 2 day history of increased debilitation, and acute confusion. Chest x ray grossly clear. ST to evaluate swallow function.       SLP Plan  MBS;Continue with current plan of care       Recommendations  Diet recommendations: NPO Medication  Administration: Via alternative means                Oral Care Recommendations: Oral care QID Follow up Recommendations: Other (comment) (tba) SLP Visit Diagnosis: Dysphagia, oropharyngeal phase (R13.12) Plan: MBS;Continue with current plan of care       GO             Bryce Baton, MS CF-SLP Speech-Language Pathologist [phone]   Bryce Potter 12/13/2016, 4:19 PM

## 2016-12-13 NOTE — Progress Notes (Signed)
PROGRESS NOTE    Bryce Potter  RUE:[id number]  DOB: [birth year]  DOA: 12/10/2016 PCP: Boneta Lucks, NP Outpatient Specialists:  Hospital course: Bryce Potter is a 83 y.o. male with medical history significant for arthritis, dementia, Bryce Potter, hypertension, hypothyroidism,stroke, brought to the emergency department by his daughter, due to 1 to 2 day history of increased debilitation, and acute confusion.   Assessment & Plan:    Acute metabolic encephalopathy likely due to UTI suggested by UA + large leukocytes in urine. Cultures  Pending. LActic acid 1.52  WBC is 7.3  CT head neg for acute findings.   Afebrile. Lactic acid is normal  Received IV hydration at 100 cc/h   In ED.   Clinically he is much improved.  E coli bacteremia - C&S from UA sensitive to all except cipro, blood culture C&S pending  TIA - Pt had an episode 4/14 and Code Stroke was called but symptoms resolved within 3 hours and no tPA was given.  He is now on rectal aspirin daily.  Will allow permissive hypertension for now.  NPO.  Stroke team following.  Lipid panel in AM.   Hypertension BP 132/71   Pulse 69  Controlled Allow permissive hypertension for 24 hours after TIA   Aspiration - Changed antibiotics to Zosyn given aspiration event, NPO for now until SLP evaluation.  Observe aspiration precautions. Likely can switch to oral antibiotics when speech has evaluated further and diet is advanced.  He is currently NPO.   Chronic kidney disease stage 3 baseline creatinine    Current Cr 1.49 improved with hydration Recent Labs       Lab Results  Component Value Date   CREATININE 1.73 (H) 12/10/2016   CREATININE 1.55 (H) 08/26/2013   CREATININE 1.80 (H) 08/25/2013     Hypothyroidism: Continue home Synthroid  Deconditioning  PT/OT consult recommending SNF, spoke with daughter about this but she is undecided.   DVT prophylaxis: SCDs   Code Status:   Full    Family Communication:  Discussed with  daughter Disposition Plan:  confirmed with daughter SNF consulted CSW 4/15 Admission status Medsurg  Subjective: Pt is asking about eating this morning, says he is hungry.  Slept well.   Objective: Vitals:   12/12/16 1200 12/12/16 2041 12/13/16 0511 12/13/16 0925  BP: 138/68 (!) 163/7 132/60 (!) 134/57  Pulse: 62 78 80 81  Resp: Temp:  97.9 F (36.6 C) 98.9 F (37.2 C) 98.8 F (37.1 C)  TempSrc:  Oral Oral Oral  SpO2: 98% 97% 98% 98%  Weight:  88.5 kg (195 lb 3.2 oz)    Height:        Intake/Output Summary (Last 24 hours) at 12/13/16 1046 Last data filed at 12/13/16 0600  Gross per 24 hour  Intake           189.17 ml  Output             1700 ml  Net         -1510.83 ml   Filed Weights   12/11/16 2300 12/12/16 0500 12/12/16 2041  Weight: 91.8 kg (202 lb 6.1 oz) 89.9 kg (198 lb 3.2 oz) 88.5 kg (195 lb 3.2 oz)    Exam:  General exam: awake, alert, NAD Respiratory system:  No increased work of breathing. Cardiovascular system: S1 & S2 heard. Gastrointestinal system: Abdomen is nondistended, soft and nontender. Normal bowel sounds heard. Central nervous system: Alert. No focal neurological deficits. Extremities: no cyanosis.  Data Reviewed: Basic Metabolic Panel:  Recent Labs Lab 12/10/16 1103 12/11/16 0446 12/12/16 0923  NA 141 142 137  K 4.4 4.4 3.8  CL 109 111 105  CO2 GLUCOSE 122* 127* 112*  BUN 21* 21* 19  CREATININE 1.73* 1.75* 1.49*  CALCIUM 9.0 8.3* 8.5*   Liver Function Tests:  Recent Labs Lab 12/10/16 1103 12/11/16 0446 12/12/16 0923  AST ALT ALKPHOS 57 55 50  BILITOT 0.4 0.4 0.7  PROT 7.6 6.6 6.9  ALBUMIN 3.2* 2.7* 2.9*   No results for input(s): LIPASE, AMYLASE in the last 168 hours.  Recent Labs Lab 12/10/16 1421  AMMONIA 17   CBC:  Recent Labs Lab 12/10/16 1103 12/11/16 0446 12/12/16 0923  WBC 7.3 15.9* 8.5  NEUTROABS 4.2  --   --   HGB 11.8* 10.4* 10.4*  HCT 36.6* 32.9*  32.3*  MCV 91.7 92.4 90.7  PLT 234 215 223   Cardiac Enzymes: No results for input(s): CKTOTAL, CKMB, CKMBINDEX, TROPONINI in the last 168 hours. CBG (last 3)  No results for input(s): GLUCAP in the last 72 hours. Recent Results (from the past 240 hour(s))  Urine culture     Status: Abnormal   Collection Time: 12/10/16 10:50 AM  Result Value Ref Range Status   Specimen Description URINE, CATHETERIZED  Final   Special Requests NONE  Final   Culture >=100,000 COLONIES/mL ESCHERICHIA COLI (A)  Final   Report Status 12/12/2016 FINAL  Final   Organism ID, Bacteria ESCHERICHIA COLI (A)  Final      Susceptibility   Escherichia coli - MIC*    AMPICILLIN <=2 SENSITIVE Sensitive     CEFAZOLIN <=4 SENSITIVE Sensitive     CEFTRIAXONE <=1 SENSITIVE Sensitive     CIPROFLOXACIN >=4 RESISTANT Resistant     GENTAMICIN <=1 SENSITIVE Sensitive     IMIPENEM <=0.25 SENSITIVE Sensitive     NITROFURANTOIN <=16 SENSITIVE Sensitive     TRIMETH/SULFA <=20 SENSITIVE Sensitive     AMPICILLIN/SULBACTAM <=2 SENSITIVE Sensitive     PIP/TAZO <=4 SENSITIVE Sensitive     Extended ESBL NEGATIVE Sensitive     * >=100,000 COLONIES/mL ESCHERICHIA COLI  Culture, blood (Routine x 2)     Status: Abnormal   Collection Time: 12/10/16 11:06 AM  Result Value Ref Range Status   Specimen Description BLOOD RIGHT ANTECUBITAL  Final   Special Requests   Final    BOTTLES DRAWN AEROBIC AND ANAEROBIC Blood Culture adequate volume   Culture  Setup Time   Final    GRAM NEGATIVE RODS ANAEROBIC BOTTLE ONLY CRITICAL RESULT CALLED TO, READ BACK BY AND VERIFIED WITH: JAMES LEDFORD, PHARMD  12/11/16 MKELLY,MLT    Culture ESCHERICHIA COLI (A)  Final   Report Status 12/13/2016 FINAL  Final   Organism ID, Bacteria ESCHERICHIA COLI  Final      Susceptibility   Escherichia coli - MIC*    AMPICILLIN <=2 SENSITIVE Sensitive     CEFAZOLIN <=4 SENSITIVE Sensitive     CEFEPIME <=1 SENSITIVE Sensitive     CEFTAZIDIME <=1 SENSITIVE  Sensitive     CEFTRIAXONE <=1 SENSITIVE Sensitive     CIPROFLOXACIN >=4 RESISTANT Resistant     GENTAMICIN <=1 SENSITIVE Sensitive     IMIPENEM <=0.25 SENSITIVE Sensitive     TRIMETH/SULFA <=20 SENSITIVE Sensitive     AMPICILLIN/SULBACTAM <=2 SENSITIVE Sensitive     PIP/TAZO <=4 SENSITIVE Sensitive     Extended ESBL  NEGATIVE Sensitive     * ESCHERICHIA COLI  Blood Culture ID Panel (Reflexed)     Status: Abnormal   Collection Time: 12/10/16 11:06 AM  Result Value Ref Range Status   Enterococcus species NOT DETECTED NOT DETECTED Final   Listeria monocytogenes NOT DETECTED NOT DETECTED Final   Staphylococcus species NOT DETECTED NOT DETECTED Final   Staphylococcus aureus NOT DETECTED NOT DETECTED Final   Streptococcus species NOT DETECTED NOT DETECTED Final   Streptococcus agalactiae NOT DETECTED NOT DETECTED Final   Streptococcus pneumoniae NOT DETECTED NOT DETECTED Final   Streptococcus pyogenes NOT DETECTED NOT DETECTED Final   Acinetobacter baumannii NOT DETECTED NOT DETECTED Final   Enterobacteriaceae species DETECTED (A) NOT DETECTED Final    Comment: Enterobacteriaceae represent a large family of gram-negative bacteria, not a single organism. CRITICAL RESULT CALLED TO, READ BACK BY AND VERIFIED WITH: JAMES LEDFORD, PHARMD  12/11/16 MKELLY,MLT    Enterobacter cloacae complex NOT DETECTED NOT DETECTED Final   Escherichia coli DETECTED (A) NOT DETECTED Final    Comment: CRITICAL RESULT CALLED TO, READ BACK BY AND VERIFIED WITH: JAMES LEDFORD, PHARMD  12/11/16 MKELLY,MLT    Klebsiella oxytoca NOT DETECTED NOT DETECTED Final   Klebsiella pneumoniae NOT DETECTED NOT DETECTED Final   Proteus species NOT DETECTED NOT DETECTED Final   Serratia marcescens NOT DETECTED NOT DETECTED Final   Carbapenem resistance NOT DETECTED NOT DETECTED Final   Haemophilus influenzae NOT DETECTED NOT DETECTED Final   Neisseria meningitidis NOT DETECTED NOT DETECTED Final   Pseudomonas  aeruginosa NOT DETECTED NOT DETECTED Final   Candida albicans NOT DETECTED NOT DETECTED Final   Candida glabrata NOT DETECTED NOT DETECTED Final   Candida krusei NOT DETECTED NOT DETECTED Final   Candida parapsilosis NOT DETECTED NOT DETECTED Final   Candida tropicalis NOT DETECTED NOT DETECTED Final  Culture, blood (Routine x 2)     Status: None (Preliminary result)   Collection Time: 12/10/16 11:16 AM  Result Value Ref Range Status   Specimen Description BLOOD LEFT ANTECUBITAL  Final   Special Requests IN PEDIATRIC BOTTLE Blood Culture adequate volume  Final   Culture NO GROWTH 2 DAYS  Final   Report Status PENDING  Incomplete     Studies: Dg Chest Port 1 View  Result Date: 12/11/2016 CLINICAL DATA:  Acute onset of cough.  Initial encounter. EXAM: PORTABLE CHEST 1 VIEW COMPARISON:  Chest radiograph performed 12/10/2016 FINDINGS: The lungs are well-aerated and clear. There is no evidence of focal opacification, pleural effusion or pneumothorax. The cardiomediastinal silhouette is enlarged. No acute osseous abnormalities are seen. IMPRESSION: Cardiomegaly.  Lungs remain grossly clear. Electronically Signed   By: Roanna Raider M.D.   On: 12/11/2016 23:43   Ct Head Code Stroke Wo Contrast`  Result Date: 12/12/2016 CLINICAL DATA:  Code stroke. Altered mental status. Right-sided facial droop and drooling. The patient is leaning to the right. EXAM: CT HEAD WITHOUT CONTRAST TECHNIQUE: Contiguous axial images were obtained from the base of the skull through the vertex without intravenous contrast. COMPARISON:  CT head without contrast 12/10/2016 and 08/25/2013. FINDINGS: Brain: Passed atrophy diffuse white matter disease is again seen. There may be subtle loss of gray-white differentiation along the left insular cortex compared to the prior studies. The basal ganglia are intact. Remote lacunar infarcts of the basal ganglia are stable. No acute hemorrhage is present. The ventricles are proportionate  to the degree of atrophy and stable. No significant extra-axial fluid collection is present. Vascular: The left M1  segment is slightly hyperdense compared to the other vessels. This could represent thrombus. Atherosclerotic calcifications are again noted within the cavernous segments bilaterally. Skull: Calvarium is intact. No focal lytic or blastic lesions are present. Sinuses/Orbits: The paranasal sinuses and mastoid air cells are clear. Bilateral lens replacements are present. The globes and orbits are otherwise intact. ASPECTS [facility] Stroke Program Early CT Score) - Ganglionic level infarction (caudate, lentiform nuclei, internal capsule, insula, M1-M3 cortex): 6/7 - Supraganglionic infarction (M4-M6 cortex): 3/3 Total score (0-10 with 10 being normal): 9/10 IMPRESSION: 1. Possible developing nonhemorrhagic left MCA territory infarct with subtle change in gray-white differentiation along the insular cortex and asymmetric hyperdense left M1 segments suggesting possible large vessel occlusion. CTA of the head and neck would be useful. CT perfusion could be utilized for evaluation of cerebral ischemia if indicated. 2. ASPECTS is 9/10 3. Advanced atrophy and diffuse white matter disease is otherwise stable. 4. Remote lacunar infarcts are noted within the basal ganglia bilaterally. These results were called by telephone at the time of interpretation on 12/12/2016 at 2:31 pm to Dr. Nicholas Lose, who verbally acknowledged these results. Electronically Signed   By: Marin Roberts M.D.   On: 12/12/2016 14:32   Scheduled Meds: . aspirin  300 mg Rectal Daily  . levothyroxine  37.5 mcg Intravenous QAC breakfast  . pantoprazole  40 mg Oral Daily  . piperacillin-tazobactam (ZOSYN)  IV  3.375 g Intravenous Q8H   Continuous Infusions: . sodium chloride 50 mL/hr at 12/13/16 0325    Active Problems:   E coli bacteremia   TIA (transient ischemic attack)   Weakness generalized   FTT (failure to thrive) in adult    CKD (chronic kidney disease), stage III   Hypothyroidism   UTI (urinary tract infection)  Time spent:   Standley Dakins, MD, FAAFP [facility] Pager [phone]  If 7PM-7AM, please contact night-coverage [email or web address] Password [email or web address] 12/13/2016, 10:46 AM    LOS: 2 days

## 2016-12-13 NOTE — Progress Notes (Signed)
OT Cancellation    12/13/16 1400  OT Visit Information  Last OT Received On 12/13/16  Reason Eval/Treat Not Completed Patient at procedure or test/ unavailable (MRI per RN. Will check back as schedule allows.)   [facility], OTR/L [phone]

## 2016-12-14 ENCOUNTER — Inpatient Hospital Stay (HOSPITAL_COMMUNITY): Payer: Medicare Other

## 2016-12-14 DIAGNOSIS — I639 Cerebral infarction, unspecified: Secondary | ICD-10-CM | POA: Diagnosis present

## 2016-12-14 DIAGNOSIS — G459 Transient cerebral ischemic attack, unspecified: Secondary | ICD-10-CM

## 2016-12-14 LAB — CBC
HCT: 34.1 % — ABNORMAL LOW (ref 39.0–52.0)
Hemoglobin: 11.1 g/dL — ABNORMAL LOW (ref 13.0–17.0)
MCH: 29.4 pg (ref 26.0–34.0)
MCHC: 32.6 g/dL (ref 30.0–36.0)
MCV: 90.2 fL (ref 78.0–100.0)
PLATELETS: 234 10*3/uL (ref 150–400)
RBC: 3.78 MIL/uL — AB (ref 4.22–5.81)
RDW: 12.9 % (ref 11.5–15.5)
WBC: 6.7 10*3/uL (ref 4.0–10.5)

## 2016-12-14 LAB — LIPID PANEL
CHOL/HDL RATIO: 6.5 ratio
CHOLESTEROL: 176 mg/dL (ref 0–200)
HDL: 27 mg/dL — AB (ref >40)
LDL Cholesterol: 118 mg/dL — ABNORMAL HIGH (ref 0–99)
Triglycerides: 153 mg/dL — ABNORMAL HIGH (ref <150)
VLDL: 31 mg/dL (ref 0–40)

## 2016-12-14 LAB — BASIC METABOLIC PANEL
Anion gap: 11 (ref 5–15)
BUN: 20 mg/dL (ref 6–20)
CO2: 21 mmol/L — ABNORMAL LOW (ref 22–32)
CREATININE: 1.51 mg/dL — AB (ref 0.61–1.24)
Calcium: 8.8 mg/dL — ABNORMAL LOW (ref 8.9–10.3)
Chloride: 108 mmol/L (ref 101–111)
GFR calc Af Amer: 43 mL/min — ABNORMAL LOW (ref >60)
GFR, EST NON AFRICAN AMERICAN: 37 mL/min — AB (ref >60)
Glucose, Bld: 86 mg/dL (ref 65–99)
POTASSIUM: 3.9 mmol/L (ref 3.5–5.1)
SODIUM: 140 mmol/L (ref 135–145)

## 2016-12-14 LAB — ECHOCARDIOGRAM COMPLETE
Height: 73 in
Weight: 3120 oz

## 2016-12-14 LAB — HEMOGLOBIN A1C
HEMOGLOBIN A1C: 6.6 % — AB (ref 4.8–5.6)
MEAN PLASMA GLUCOSE: 143 mg/dL

## 2016-12-14 MED ORDER — METOPROLOL TARTRATE 25 MG PO TABS
25.0000 mg | ORAL_TABLET | Freq: Two times a day (BID) | ORAL | Status: DC
  Administered 2016-12-14 – 2016-12-15 (×3): 25 mg via ORAL
  Filled 2016-12-14 (×3): qty 1

## 2016-12-14 MED ORDER — ATORVASTATIN CALCIUM 20 MG PO TABS
20.0000 mg | ORAL_TABLET | Freq: Every day | ORAL | Status: DC
  Administered 2016-12-14: 20 mg via ORAL
  Filled 2016-12-14: qty 1

## 2016-12-14 MED ORDER — TAMSULOSIN HCL 0.4 MG PO CAPS
0.4000 mg | ORAL_CAPSULE | Freq: Every day | ORAL | Status: DC
  Administered 2016-12-14: 0.4 mg via ORAL
  Filled 2016-12-14: qty 1

## 2016-12-14 MED ORDER — LEVOTHYROXINE SODIUM 75 MCG PO TABS
75.0000 ug | ORAL_TABLET | Freq: Every day | ORAL | Status: DC
  Administered 2016-12-15: 75 ug via ORAL
  Filled 2016-12-14: qty 1

## 2016-12-14 MED ORDER — RESOURCE THICKENUP CLEAR PO POWD
ORAL | Status: DC | PRN
  Filled 2016-12-14: qty 125

## 2016-12-14 MED ORDER — SODIUM CHLORIDE 0.9 % IV SOLN
1.5000 g | Freq: Four times a day (QID) | INTRAVENOUS | Status: DC
  Administered 2016-12-14 – 2016-12-15 (×3): 1.5 g via INTRAVENOUS
  Filled 2016-12-14 (×4): qty 1.5

## 2016-12-14 MED ORDER — CLOPIDOGREL BISULFATE 75 MG PO TABS
75.0000 mg | ORAL_TABLET | Freq: Every day | ORAL | Status: DC
  Administered 2016-12-14 – 2016-12-15 (×2): 75 mg via ORAL
  Filled 2016-12-14 (×2): qty 1

## 2016-12-14 NOTE — Progress Notes (Signed)
PT Cancellation Note  Patient Details Name: Bryce Potter MRN: [id number] DOB: 1918/10/21   Cancelled Treatment:    Reason Eval/Treat Not Completed: Patient at procedure or test/unavailable (pt off unit for swallow study. Will follow. )   Tamala Ser 12/14/2016, 2:23 PM [phone]

## 2016-12-14 NOTE — Progress Notes (Signed)
  Echocardiogram 2D Echocardiogram has been performed.  Tye Savoy 12/14/2016, 9:31 AM

## 2016-12-14 NOTE — Progress Notes (Signed)
PROGRESS NOTE    Bryce Potter  ZOX:[id number]  DOB: 08-28-1919  DOA: 12/10/2016 PCP: Boneta Lucks, NP Outpatient Specialists:  Hospital course: Bryce Potter is a 83 y.o. male with medical history significant for arthritis, dementia, [name], hypertension, hypothyroidism,stroke, brought to the emergency department by his daughter, due to 1 to 2 day history of increased debilitation, and acute confusion.   Assessment & Plan:   Acute metabolic encephalopathy likely due to UTI suggested by UA + large leukocytes in urine. Lactic acid 1.52  WBC is 7.3  CT head neg for acute findings.  Afebrile. Lactic acid is normal  Received IV hydration at 100 cc/h   In ED.   Clinically he is much improved.  E coli bacteremia - C&S from UA sensitive to all except cipro, blood culture C&S same  Continue unasyn IV until can take p.o. better  Acute lacunar CVA - Pt had an episode 4/14 and Code Stroke was called but symptoms resolved within 3 hours and no tPA was given.  He is now on plavix daily.  He has DM type 2 with A1c 6.6% which will be managed with diet.  Will allow permissive hypertension for now.  NPO except meds with MBS ordered for 4/16.  Stroke team following.  Lipid panel with elevated LDL. Atorvastatin 20 mg daily started.   Hypertension BP 132/71   Pulse 69  Controlled Allow permissive hypertension for 24 hours after TIA   Aspiration - Unasyn IV ordered, NPO for now until SLP evaluation.  Observe aspiration precautions. MBS study ordered for 4/16.    Chronic kidney disease stage 3 baseline creatinine    Current Cr 1.49 improved with hydration Recent Labs       Lab Results  Component Value Date   CREATININE 1.73 (H) 12/10/2016   CREATININE 1.55 (H) 08/26/2013   CREATININE 1.80 (H) 08/25/2013     Hypothyroidism: Continue home Synthroid  Deconditioning  PT/OT consult recommending SNF, spoke with daughter about this but she is undecided.   DVT prophylaxis: SCDs   Code Status:    Full    Family Communication:  Discussed with daughter Disposition Plan:  confirmed with daughter SNF consulted CSW 4/15 Admission status Medsurg  Subjective: Pt much more alert and talkative.  Wants to eat.   Objective: Vitals:   12/13/16 0925 12/14/16 0051 12/14/16 0608 12/14/16 1023  BP: (!) 134/57 (!) 148/61 (!) 131/52 (!) 146/72  Pulse: 81 68 73 78  Resp: Temp: 98.8 F (37.1 C) 98.3 F (36.8 C) 97.3 F (36.3 C) 98.5 F (36.9 C)  TempSrc: Oral Oral Oral Oral  SpO2: 98% 97% 97% 97%  Weight:  88.5 kg (195 lb)    Height:        Intake/Output Summary (Last 24 hours) at 12/14/16 1231 Last data filed at 12/14/16 8119  Gross per 24 hour  Intake              545 ml  Output              700 ml  Net             -155 ml   Filed Weights   12/12/16 0500 12/12/16 2041 12/14/16 0051  Weight: 89.9 kg (198 lb 3.2 oz) 88.5 kg (195 lb 3.2 oz) 88.5 kg (195 lb)    Exam:  General exam: awake, alert, NAD Respiratory system:  No increased work of breathing. Cardiovascular system: S1 & S2 heard. Gastrointestinal system: Abdomen  is nondistended, soft and nontender. Normal bowel sounds heard. Central nervous system: Alert. No focal neurological deficits. Extremities: no cyanosis.  Data Reviewed: Basic Metabolic Panel:  Recent Labs Lab 12/10/16 1103 12/11/16 0446 12/12/16 0923 12/14/16 0509  NA 141 142 137 140  K 4.4 4.4 3.8 3.9  CL 109 111 105 108  CO2 21*  GLUCOSE 122* 127* 112* 86  BUN 21* 21* 19 20  CREATININE 1.73* 1.75* 1.49* 1.51*  CALCIUM 9.0 8.3* 8.5* 8.8*   Liver Function Tests:  Recent Labs Lab 12/10/16 1103 12/11/16 0446 12/12/16 0923  AST ALT ALKPHOS 57 55 50  BILITOT 0.4 0.4 0.7  PROT 7.6 6.6 6.9  ALBUMIN 3.2* 2.7* 2.9*   No results for input(s): LIPASE, AMYLASE in the last 168 hours.  Recent Labs Lab 12/10/16 1421  AMMONIA 17   CBC:  Recent Labs Lab 12/10/16 1103 12/11/16 0446 12/12/16 0923  12/14/16 0509  WBC 7.3 15.9* 8.5 6.7  NEUTROABS 4.2  --   --   --   HGB 11.8* 10.4* 10.4* 11.1*  HCT 36.6* 32.9* 32.3* 34.1*  MCV 91.7 92.4 90.7 90.2  PLT 234 215 223 234   Cardiac Enzymes: No results for input(s): CKTOTAL, CKMB, CKMBINDEX, TROPONINI in the last 168 hours. CBG (last 3)  No results for input(s): GLUCAP in the last 72 hours. Recent Results (from the past 240 hour(s))  Urine culture     Status: Abnormal   Collection Time: 12/10/16 10:50 AM  Result Value Ref Range Status   Specimen Description URINE, CATHETERIZED  Final   Special Requests NONE  Final   Culture >=100,000 COLONIES/mL ESCHERICHIA COLI (A)  Final   Report Status 12/12/2016 FINAL  Final   Organism ID, Bacteria ESCHERICHIA COLI (A)  Final      Susceptibility   Escherichia coli - MIC*    AMPICILLIN <=2 SENSITIVE Sensitive     CEFAZOLIN <=4 SENSITIVE Sensitive     CEFTRIAXONE <=1 SENSITIVE Sensitive     CIPROFLOXACIN >=4 RESISTANT Resistant     GENTAMICIN <=1 SENSITIVE Sensitive     IMIPENEM <=0.25 SENSITIVE Sensitive     NITROFURANTOIN <=16 SENSITIVE Sensitive     TRIMETH/SULFA <=20 SENSITIVE Sensitive     AMPICILLIN/SULBACTAM <=2 SENSITIVE Sensitive     PIP/TAZO <=4 SENSITIVE Sensitive     Extended ESBL NEGATIVE Sensitive     * >=100,000 COLONIES/mL ESCHERICHIA COLI  Culture, blood (Routine x 2)     Status: Abnormal   Collection Time: 12/10/16 11:06 AM  Result Value Ref Range Status   Specimen Description BLOOD RIGHT ANTECUBITAL  Final   Special Requests   Final    BOTTLES DRAWN AEROBIC AND ANAEROBIC Blood Culture adequate volume   Culture  Setup Time   Final    GRAM NEGATIVE RODS ANAEROBIC BOTTLE ONLY CRITICAL RESULT CALLED TO, READ BACK BY AND VERIFIED WITH: JAMES LEDFORD, PHARMD  12/11/16 MKELLY,MLT    Culture ESCHERICHIA COLI (A)  Final   Report Status 12/13/2016 FINAL  Final   Organism ID, Bacteria ESCHERICHIA COLI  Final      Susceptibility   Escherichia coli - MIC*    AMPICILLIN  <=2 SENSITIVE Sensitive     CEFAZOLIN <=4 SENSITIVE Sensitive     CEFEPIME <=1 SENSITIVE Sensitive     CEFTAZIDIME <=1 SENSITIVE Sensitive     CEFTRIAXONE <=1 SENSITIVE Sensitive     CIPROFLOXACIN >=4 RESISTANT Resistant     GENTAMICIN <=  1 SENSITIVE Sensitive     IMIPENEM <=0.25 SENSITIVE Sensitive     TRIMETH/SULFA <=20 SENSITIVE Sensitive     AMPICILLIN/SULBACTAM <=2 SENSITIVE Sensitive     PIP/TAZO <=4 SENSITIVE Sensitive     Extended ESBL NEGATIVE Sensitive     * ESCHERICHIA COLI  Blood Culture ID Panel (Reflexed)     Status: Abnormal   Collection Time: 12/10/16 11:06 AM  Result Value Ref Range Status   Enterococcus species NOT DETECTED NOT DETECTED Final   Listeria monocytogenes NOT DETECTED NOT DETECTED Final   Staphylococcus species NOT DETECTED NOT DETECTED Final   Staphylococcus aureus NOT DETECTED NOT DETECTED Final   Streptococcus species NOT DETECTED NOT DETECTED Final   Streptococcus agalactiae NOT DETECTED NOT DETECTED Final   Streptococcus pneumoniae NOT DETECTED NOT DETECTED Final   Streptococcus pyogenes NOT DETECTED NOT DETECTED Final   Acinetobacter baumannii NOT DETECTED NOT DETECTED Final   Enterobacteriaceae species DETECTED (A) NOT DETECTED Final    Comment: Enterobacteriaceae represent a large family of gram-negative bacteria, not a single organism. CRITICAL RESULT CALLED TO, READ BACK BY AND VERIFIED WITH: JAMES LEDFORD, PHARMD @0451  12/11/16 MKELLY,MLT    Enterobacter cloacae complex NOT DETECTED NOT DETECTED Final   Escherichia coli DETECTED (A) NOT DETECTED Final    Comment: CRITICAL RESULT CALLED TO, READ BACK BY AND VERIFIED WITH: JAMES LEDFORD, PHARMD @0451  12/11/16 MKELLY,MLT    Klebsiella oxytoca NOT DETECTED NOT DETECTED Final   Klebsiella pneumoniae NOT DETECTED NOT DETECTED Final   Proteus species NOT DETECTED NOT DETECTED Final   Serratia marcescens NOT DETECTED NOT DETECTED Final   Carbapenem resistance NOT DETECTED NOT DETECTED Final    Haemophilus influenzae NOT DETECTED NOT DETECTED Final   Neisseria meningitidis NOT DETECTED NOT DETECTED Final   Pseudomonas aeruginosa NOT DETECTED NOT DETECTED Final   Candida albicans NOT DETECTED NOT DETECTED Final   Candida glabrata NOT DETECTED NOT DETECTED Final   Candida krusei NOT DETECTED NOT DETECTED Final   Candida parapsilosis NOT DETECTED NOT DETECTED Final   Candida tropicalis NOT DETECTED NOT DETECTED Final  Culture, blood (Routine x 2)     Status: None (Preliminary result)   Collection Time: 12/10/16 11:16 AM  Result Value Ref Range Status   Specimen Description BLOOD LEFT ANTECUBITAL  Final   Special Requests IN PEDIATRIC BOTTLE Blood Culture adequate volume  Final   Culture NO GROWTH 3 DAYS  Final   Report Status PENDING  Incomplete     Studies: Mr Shirlee Latch ZO Contrast  Result Date: 12/13/2016 CLINICAL DATA:  83 year old male with altered mental status. Possible early left MCA territory infarct on noncontrast head CT today. EXAM: MRI HEAD WITHOUT CONTRAST MRA HEAD WITHOUT CONTRAST TECHNIQUE: Multiplanar, multiecho pulse sequences of the brain and surrounding structures were obtained without intravenous contrast. Angiographic images of the head were obtained using MRA technique without contrast. COMPARISON:  Head CT without contrast 1409 hours. FINDINGS: MRI HEAD FINDINGS The examination had to be discontinued prior to completion due to patient coughing, confusion, and agitation. Axial and coronal diffusion weighted imaging plus axial T2, FLAIR, and T2* imaging was obtained and is intermittently degraded by motion artifact. Several Subcentimeter foci of restricted diffusion in the right posterior corona radiata and right lentiform nuclei (series 4 images 27-31. No other convincing restricted diffusion identified. Mild associated T2 and FLAIR hyperintensity with the acute diffusion findings. Superimposed chronic lacunar infarcts in the right basal ganglia and thalamus.  Superimposed confluent bilateral cerebral white matter T2 and FLAIR  hyperintensity with scattered areas of chronic white matter lacunar infarcts (series 6, image 19). No definite chronic cerebral blood products. No midline shift. No extra-axial collection or acute intracranial hemorrhage identified. Stable ventricle size and configuration. Patent basilar cisterns. MRA HEAD FINDINGS Study is intermittently degraded by motion artifact despite repeated imaging attempts. Antegrade flow in the distal left vertebral artery, but no antegrade flow signal in the distal right vertebral artery. Antegrade flow in the basilar without stenosis. PCA origins, P1 and P2 segments bilaterally are patent. Posterior communicating arteries are diminutive or absent. Antegrade flow in both ICA siphons. Bilateral carotid termini are patent. Both MCA and ACA origins appear patent. But insufficient bilateral MCA and ACA branch detail otherwise. IMPRESSION: 1. Prematurely discontinued and intermittently motion degraded study due to patient agitation. 2. Scattered small acute lacunar infarcts in the right corona radiata and posterior right deep gray matter nuclei. No associated hemorrhage or mass effect. 3. No strong evidence of emergent large vessel occlusion. 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 4. Underlying chronic small vessel disease in the bilateral cerebral white matter and right deep gray matter nuclei. Electronically Signed   By: Odessa Fleming M.D.   On: 12/13/2016 14:56   Mr Brain Wo Contrast  Result Date: 12/13/2016 CLINICAL DATA:  83 year old male with altered mental status. Possible early left MCA territory infarct on noncontrast head CT today. EXAM: MRI HEAD WITHOUT CONTRAST MRA HEAD WITHOUT CONTRAST TECHNIQUE: Multiplanar, multiecho pulse sequences of the brain and surrounding structures were obtained without intravenous contrast. Angiographic images of the head were obtained using MRA technique without  contrast. COMPARISON:  Head CT without contrast 1409 hours. FINDINGS: MRI HEAD FINDINGS The examination had to be discontinued prior to completion due to patient coughing, confusion, and agitation. Axial and coronal diffusion weighted imaging plus axial T2, FLAIR, and T2* imaging was obtained and is intermittently degraded by motion artifact. Several Subcentimeter foci of restricted diffusion in the right posterior corona radiata and right lentiform nuclei (series 4 images 27-31. No other convincing restricted diffusion identified. Mild associated T2 and FLAIR hyperintensity with the acute diffusion findings. Superimposed chronic lacunar infarcts in the right basal ganglia and thalamus. Superimposed confluent bilateral cerebral white matter T2 and FLAIR hyperintensity with scattered areas of chronic white matter lacunar infarcts (series 6, image 19). No definite chronic cerebral blood products. No midline shift. No extra-axial collection or acute intracranial hemorrhage identified. Stable ventricle size and configuration. Patent basilar cisterns. MRA HEAD FINDINGS Study is intermittently degraded by motion artifact despite repeated imaging attempts. Antegrade flow in the distal left vertebral artery, but no antegrade flow signal in the distal right vertebral artery. Antegrade flow in the basilar without stenosis. PCA origins, P1 and P2 segments bilaterally are patent. Posterior communicating arteries are diminutive or absent. Antegrade flow in both ICA siphons. Bilateral carotid termini are patent. Both MCA and ACA origins appear patent. But insufficient bilateral MCA and ACA branch detail otherwise. IMPRESSION: 1. Prematurely discontinued and intermittently motion degraded study due to patient agitation. 2. Scattered small acute lacunar infarcts in the right corona radiata and posterior right deep gray matter nuclei. No associated hemorrhage or mass effect. 3. No strong evidence of emergent large vessel occlusion.  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 4. Underlying chronic small vessel disease in the bilateral cerebral white matter and right deep gray matter nuclei. Electronically Signed   By: Odessa Fleming M.D.   On: 12/13/2016 14:56   Ct Head Code Stroke  Wo Contrast`  Result Date: 12/12/2016 CLINICAL DATA:  Code stroke. Altered mental status. Right-sided facial droop and drooling. The patient is leaning to the right. EXAM: CT HEAD WITHOUT CONTRAST TECHNIQUE: Contiguous axial images were obtained from the base of the skull through the vertex without intravenous contrast. COMPARISON:  CT head without contrast 12/10/2016 and 08/25/2013. FINDINGS: Brain: Passed atrophy diffuse white matter disease is again seen. There may be subtle loss of gray-white differentiation along the left insular cortex compared to the prior studies. The basal ganglia are intact. Remote lacunar infarcts of the basal ganglia are stable. No acute hemorrhage is present. The ventricles are proportionate to the degree of atrophy and stable. No significant extra-axial fluid collection is present. Vascular: The left M1 segment is slightly hyperdense compared to the other vessels. This could represent thrombus. Atherosclerotic calcifications are again noted within the cavernous segments bilaterally. Skull: Calvarium is intact. No focal lytic or blastic lesions are present. Sinuses/Orbits: The paranasal sinuses and mastoid air cells are clear. Bilateral lens replacements are present. The globes and orbits are otherwise intact. ASPECTS [facility] Stroke Program Early CT Score) - Ganglionic level infarction (caudate, lentiform nuclei, internal capsule, insula, M1-M3 cortex): 6/7 - Supraganglionic infarction (M4-M6 cortex): 3/3 Total score (0-10 with 10 being normal): 9/10 IMPRESSION: 1. Possible developing nonhemorrhagic left MCA territory infarct with subtle change in gray-white differentiation along the insular cortex and asymmetric  hyperdense left M1 segments suggesting possible large vessel occlusion. CTA of the head and neck would be useful. CT perfusion could be utilized for evaluation of cerebral ischemia if indicated. 2. ASPECTS is 9/10 3. Advanced atrophy and diffuse white matter disease is otherwise stable. 4. Remote lacunar infarcts are noted within the basal ganglia bilaterally. These results were called by telephone at the time of interpretation on 12/12/2016 at 2:31 pm to Dr. Nicholas Lose, who verbally acknowledged these results. Electronically Signed   By: Marin Roberts M.D.   On: 12/12/2016 14:32   Scheduled Meds: . atorvastatin  20 mg Oral q1800  . clopidogrel  75 mg Oral Daily  . heparin  5,000 Units Subcutaneous Q8H  . [START ON 12/15/2016] levothyroxine  75 mcg Oral QAC breakfast  . metoprolol tartrate  25 mg Oral BID  . pantoprazole  40 mg Oral Daily  . piperacillin-tazobactam (ZOSYN)  IV  3.375 g Intravenous Q8H  . tamsulosin  0.4 mg Oral QPC supper   Continuous Infusions:  Active Problems:   E coli bacteremia   TIA (transient ischemic attack)   Weakness generalized   FTT (failure to thrive) in adult   CKD (chronic kidney disease), stage III   Hypothyroidism   UTI (urinary tract infection)  Time spent:   Standley Dakins, MD, FAAFP [facility] Pager [phone]  If 7PM-7AM, please contact night-coverage [email or web address] Password [email or web address] 12/14/2016, 12:31 PM    LOS: 3 days

## 2016-12-14 NOTE — Progress Notes (Signed)
Modified Barium Swallow Progress Note  Patient Details  Name: Ifeanyichukwu Wickham MRN: [id number] Date of Birth: 1919/07/19  Today's Date: 12/14/2016  Modified Barium Swallow completed.  Full report located under Chart Review in the Imaging Section.  Brief recommendations include the following:  Clinical Impression  Mr. Lottman presents with a moderate oral and moderate sensorimotor pharyngeal dysphagia. Pt required assistance for feeding as well as verbal cueing and reminders throughout the MBS to "swallow". Oral phase impairments > pharyngeal impairments consisting of delayed oral transit/holding, lingual/palatal residue, and poor labial closure resulting in anterior spillage. Pt reported that he has partial dentures in his room, although they were unavailable during this study. Observed moderate vallecular residue with each tested consistency that was ineffectively cleared with cues for second swallows. Cup sips of thin liquids resulted in silent penetration to the level of the vocal cords with a weak, cued cough that did not clear penetrates. Compensatory strategies or postural changes were not attempted due to pt's hx dementia and unlikely compliance/carryover. Educated pt and daughter re: results of MBS and diet recomendations. Recommend Dys 2 solids, nectar thick liquids (no straws), meds crushed, lingual sweep for clearance of pocketing. ST will f/u briefly for continued education and for diet tolerance.   Swallow Evaluation Recommendations       SLP Diet Recommendations: Dysphagia 2 (Fine chop) solids;Nectar thick liquid   Liquid Administration via: Cup;No straw   Medication Administration: Crushed with puree   Supervision: Full assist for feeding;Full supervision/cueing for compensatory strategies   Compensations: Slow rate;Small sips/bites;Minimize environmental distractions;Monitor for anterior loss;Lingual sweep for clearance of pocketing   Postural Changes: Seated upright at 90  degrees   Oral Care Recommendations: Oral care BID   Other Recommendations: Order thickener from pharmacy    Royce Macadamia 12/14/2016,3:00 PM   Breck Coons Lonell Face.Ed [employer] [phone]

## 2016-12-14 NOTE — Progress Notes (Signed)
VASCULAR LAB PRELIMINARY  PRELIMINARY  PRELIMINARY  PRELIMINARY  Carotid duplex completed.    Preliminary report:  1-39% ICA plaquing. Vertebral artery flow is antegrade.   Lyanne Kates, RVT 12/14/2016, 9:22 AM

## 2016-12-14 NOTE — Progress Notes (Addendum)
  Speech Language Pathology Treatment: Dysphagia  Patient Details Name: Bryce Potter MRN: [id number] DOB: 06/19/1919 Today's Date: 12/14/2016 Time: 4098-1191 SLP Time Calculation (min) (ACUTE ONLY): 11 min  Assessment / Plan / Recommendation Clinical Impression  Daughter present during dysphagia treatment with pt alert and cooperative. Mild oral transit delays, suspect delayed swallow initiation and delayed hard reflexive cough. Required set up and initiation to self feed. Recommend MBS to fully assess swallow function, safest diet and compensatory strategies (dtr in agreement). MBS scheduled for 1:30 this afternoon.    HPI HPI: Bryce Potter a 83 y.o.malewith medical history significant forarthritis, dementia, [name], hypertension, hypothyroidism,stroke, brought to the emergency department by his daughter, due to 1 to 2 day history of increased debilitation, and acute confusion. Chest x ray grossly clear. ST to evaluate swallow function.       SLP Plan  MBS       Recommendations  Diet recommendations:  (NPO except for meds) Medication Administration: Whole meds with puree                Oral Care Recommendations: Oral care QID SLP Visit Diagnosis: Dysphagia, oropharyngeal phase (R13.12) Plan: MBS       GO                Royce Macadamia 12/14/2016, 10:23 AM  Breck Coons [employer]ell [employer] [phone]

## 2016-12-14 NOTE — Progress Notes (Signed)
OT Cancellation    12/14/16 1300  OT Visit Information  Last OT Received On 12/14/16  Reason Eval/Treat Not Completed Patient at procedure or test/ unavailable (Pt off floor at test. Will check back as schedule allows)   [facility], OTR/L [phone]

## 2016-12-14 NOTE — Progress Notes (Signed)
STROKE TEAM PROGRESS NOTE   HISTORY OF PRESENT ILLNESS (per record) Bryce Potter is an 83 y.o. male admitted with an E. Coli UTI and bacteremia with associated encephalopathy. He was ambulatory with a walker and talking normally at baseline.  Around 12 noon, he was found to be drooling, more right facial droop and weakness in right side associated with reduced level of alertness and global aphasia. CT Brain showed no hemorrhage.  There was extensive small vessel ischemic disease and old subcortical infarcts. He was evaluated around 2 pm and discussion with 2 daughters was undertaken.  During this discussion regarding risks of tPA, the patient spontaneously got better and gradually returned to close to baseline.  Although he had no IV tPA contraindications, it was aborted since he improved rapidly. Due to dysphagia he was not receiving oral medications.     SUBJECTIVE (INTERVAL HISTORY) His daughter is at the bedside.  He had transient right face droop and weakness y`day which has resolved.He denies p/h/o of TIA or stroke  OBJECTIVE Temp:  [97.3 F (36.3 C)-98.5 F (36.9 C)] 98.5 F (36.9 C) (04/16 1023) Pulse Rate:  [68-78] 78 (04/16 1023) Cardiac Rhythm: Normal sinus rhythm;Heart block (04/16 0700) Resp:  [17-18] 18 (04/16 1023) BP: (131-148)/(52-72) 146/72 (04/16 1023) SpO2:  [97 %] 97 % (04/16 1023) Weight:  [195 lb (88.5 kg)] 195 lb (88.5 kg) (04/16 0051)  CBC:   Recent Labs Lab 12/10/16 1103  12/12/16 0923 12/14/16 0509  WBC 7.3  < > 8.5 6.7  NEUTROABS 4.2  --   --   --   HGB 11.8*  < > 10.4* 11.1*  HCT 36.6*  < > 32.3* 34.1*  MCV 91.7  < > 90.7 90.2  PLT 234  < > 223 234  < > = values in this interval not displayed.  Basic Metabolic Panel:   Recent Labs Lab 12/12/16 0923 12/14/16 0509  NA 137 140  K 3.8 3.9  CL 105 108  CO2 24 21*  GLUCOSE 112* 86  BUN 19 20  CREATININE 1.49* 1.51*  CALCIUM 8.5* 8.8*    Lipid Panel:     Component Value Date/Time   CHOL 176  12/14/2016 0509   TRIG 153 (H) 12/14/2016 0509   HDL 27 (L) 12/14/2016 0509   CHOLHDL 6.5 12/14/2016 0509   VLDL 31 12/14/2016 0509   LDLCALC 118 (H) 12/14/2016 0509   HgbA1c:  Lab Results  Component Value Date   HGBA1C 6.6 (H) 12/13/2016   Urine Drug Screen: No results found for: LABOPIA, COCAINSCRNUR, LABBENZ, AMPHETMU, THCU, LABBARB  Alcohol Level No results found for: [facility]  IMAGING  Dg Chest Port 1 View 12/11/2016 Cardiomegaly.  Lungs remain grossly clear.    Ct Head Code Stroke Wo Contrast` 12/12/2016 1. Possible developing nonhemorrhagic left MCA territory infarct with subtle change in gray-white differentiation along the insular cortex and asymmetric hyperdense left M1 segments suggesting possible large vessel occlusion. CTA of the head and neck would be useful. CT perfusion could be utilized for evaluation of cerebral ischemia if indicated.  2. ASPECTS is 9/10  3. Advanced atrophy and diffuse white matter disease is otherwise stable.  4. Remote lacunar infarcts are noted within the basal ganglia bilaterally.     PHYSICAL EXAM Frail elderly african [place] male not in distress. . Afebrile. Head is nontraumatic. Neck is supple without bruit.    Cardiac exam no murmur or gallop. Lungs are clear to auscultation. Distal pulses are well felt. Neurological Exam :  Sleepy but arouses easily and follows commands well.eye movements are full range. Blinks to threat bilaterally.right lower face weakness. Mild right grip weakness Tongue midline. Motor  System exam shows no significant drift. No focal weakness. Sensation intact. Gait deferred.     ASSESSMENT/PLAN [name] is a 83 y.o. male with history of previous stroke, hypothyroidism, hypertension, dementia, E. Coli UTI, bacteremia, and associated encephalopathy presenting with right-sided weakness with reduced level of alertness and global aphasia.  He did not receive IV t-PA due to improving deficits.  Stroke:  left MCA  territory infarct possibly embolic from an unknown source.  Resultant  Mild right face droop  MRI - pending  MRA - pending  Carotid Doppler - pending  2D Echo - pending  LDL - 118  HgbA1c - 6.6  VTE prophylaxis - subcutaneous heparin DIET DYS 2 Room service appropriate? Yes; Fluid consistency: Nectar Thick  aspirin 81 mg daily prior to admission, now on aspirin 300 mg suppository daily  Patient counseled to be compliant with his antithrombotic medications  Ongoing aggressive stroke risk factor management  Therapy recommendations: pending  Disposition: Pending  Hypertension  Stable  Permissive hypertension (OK if < 220/120) but gradually normalize in 5-7 days  Long-term BP goal normotensive  Hyperlipidemia  Home meds:  No statin medication prior to admission.  LDL pending, goal < 70    Other Stroke Risk Factors  Advanced age  Hx stroke/TIA   Other Active Problems  Elevated creatinine - 1.75 -> 1.49  Anemia - 10.4 / 32.3  Dementia  NPO  UTI - Zosyn started 12/12/2016   PLAN  Continue Plavix  And Lipitor . Therapy and rehab consults. Stroke team will sign off. Call for questions.D/w patient and wife and answered questions.    Hospital day # 3  I Delia Heady, MD Medical Director [facility] Pager: [phone] 12/14/2016 3:36 PM   To contact Stroke Continuity provider, please refer to [email or web address]. After hours, contact General Neurology

## 2016-12-14 NOTE — Progress Notes (Signed)
Pharmacy Antibiotic Note  Bryce Potter is a 83 y.o. male admitted on 12/10/2016 with UTI and E Coli bacteremia. Antibiotics were broadened to Zosyn over the weekend due to concern for aspiration - now to narrow back down to Unasyn. SCr 1.51, CrCl~30-35 ml/min.   Plan: 1. Start Unasyn 1.5g IV every 6 hours 2. Will continue to follow renal function, culture results, LOT, and antibiotic de-escalation plans   Height:  (185.4 cm) Weight: 195 lb (88.5 kg) IBW/kg (Calculated) : 79.9  Temp (24hrs), Avg:98 F (36.7 C), Min:97.3 F (36.3 C), Max:98.5 F (36.9 C)   Recent Labs Lab 12/10/16 1103 12/10/16 1129 12/10/16 2145 12/11/16 0446 12/12/16 0923 12/14/16 0509  WBC 7.3  --   --  15.9* 8.5 6.7  CREATININE 1.73*  --   --  1.75* 1.49* 1.51*  LATICACIDVEN  --  1.52 2.1*  --   --   --     Estimated Creatinine Clearance: 32.3 mL/min (A) (by C-G formula based on SCr of 1.51 mg/dL (H)).    No Known Allergies  Antimicrobials this admission: CTX 4/13 >> 4/14 Zosyn 4/13 >> 4/16 Unasyn 4/16 >>  Microbiology results: 4/12 urine - > 100K/ml E coli  4/12 blood x 2 - 1 of 2 with E coli, 2nd with ng < 24 hrs   4/12 BCID: E coli  Thank you for allowing pharmacy to be a part of this patient's care.  Georgina Pillion, PharmD, BCPS Clinical Pharmacist Pager: [phone] Clinical phone for 12/14/2016 from 7a-3:30p: [phone] If after 3:30p, please call main pharmacy at: [phone] 12/14/2016 1:04 PM

## 2016-12-14 NOTE — Evaluation (Signed)
Occupational Therapy Evaluation Patient Details Name: Bryce Potter MRN: [id number] DOB: 06/22/1919 Today's Date: 12/14/2016    History of Present Illness Bryce Potter is a 83 y.o. male with medical history significant for arthritis, dementia, [name], hypertension, hypothyroidism,stroke, brought to the emergency department by his daughter, due to 1 to 2 day history of increased debilitation, and acute confusion.  Found to have UTI   Clinical Impression   PTA, pt was living with his children who provided A for ADLs and IADLs. Pt was able to perform basic ADLs such as grooming and self feeding. Currently, pt demonstrates decreased occupational performance and participation with need for Min-Mod A to maintain sitting balance for ADLs and Total+2 A for sit<>Stand. Pt would benefit from continued OT to increase performance of ADLs. Recommend pt dc to SNF for further OT to increase safety and independence as well as decreased caregiver burden.     Follow Up Recommendations  SNF;Supervision/Assistance - 24 hour    Equipment Recommendations  Other (comment) (Defer to next venue)    Recommendations for Other Services       Precautions / Restrictions Precautions Precautions: Fall Restrictions Weight Bearing Restrictions: No      Mobility Bed Mobility Overal bed mobility: Needs Assistance Bed Mobility: Supine to Sit     Supine to sit: Max assist;+2 for safety/equipment     General bed mobility comments: A to bring BLE to EOB then Max A to lift trunk into sitting  Transfers Overall transfer level: Needs assistance   Transfers: Sit to/from Stand Sit to Stand: +2 physical assistance;Total assist;+2 safety/equipment;From elevated surface         General transfer comment: cues for hand placement and significant assist to come forward and boost.    Balance Overall balance assessment: Needs assistance Sitting-balance support: Bilateral upper extremity supported Sitting balance-Leahy  Scale: Poor Sitting balance - Comments: required physical A to maintain sitting posture at EOB for feeding and grooming Postural control: Right lateral lean;Posterior lean Standing balance support: Bilateral upper extremity supported Standing balance-Leahy Scale: Zero Standing balance comment: assist to stay forward.  pt flexed forward and with flexed knees.                           ADL either performed or assessed with clinical judgement   ADL Overall ADL's : Needs assistance/impaired Eating/Feeding: Minimal assistance;Sitting;Moderate assistance Eating/Feeding Details (indicate cue type and reason): Pt performed self feeding at EOB with Min-Mod A for maintain sitting balance. Pt tendancy to lean to R Grooming: Wash/dry face;Cueing for sequencing;Moderate assistance;Minimal assistance;Sitting Grooming Details (indicate cue type and reason): Min-Mod A to maintain sitting balance at EOB                             Functional mobility during ADLs: Total assistance General ADL Comments: Pt demosntrated decreased fucntional compared to reported PLOF. Pt unable to maintain sitting balance and required Max-total +2 A for sit<>stand t/f with sara steady     Vision         Perception     Praxis      Pertinent Vitals/Pain Pain Assessment: Faces Faces Pain Scale: Hurts a little bit Pain Intervention(s): Monitored during session     Hand Dominance Right   Extremity/Trunk Assessment Upper Extremity Assessment Upper Extremity Assessment: Generalized weakness   Lower Extremity Assessment Lower Extremity Assessment: Generalized weakness   Cervical / Trunk Assessment Cervical /  Trunk Assessment: Kyphotic   Communication Communication Communication: No difficulties   Cognition Arousal/Alertness: Lethargic;Awake/alert Behavior During Therapy: Flat affect Overall Cognitive Status: History of cognitive impairments - at baseline                                      General Comments  Daughter present during session. Discussed need for skilled rehab at SNF. She agreed and verbalized understanding    Exercises     Shoulder Instructions      Home Living Family/patient expects to be discharged to:: Private residence Living Arrangements: Children Available Help at Discharge: Family;Available 24 hours/day (two daughters rotate nights.  1 dtr always there days) Type of Home: House Home Access: Stairs to enter [employer] of Steps: 1/1 Entrance Stairs-Rails: Right;Left Home Layout: One level     Bathroom Shower/Tub: Chief Strategy Officer: Handicapped height Bathroom Accessibility: Yes   Home Equipment: Environmental consultant - 2 wheels;Bedside commode;Shower seat;Wheelchair - manual;Grab bars - tub/shower;Grab bars - toilet;Hand held shower head          Prior Functioning/Environment Level of Independence: Needs assistance  Gait / Transfers Assistance Needed: Lately at a transfer level only.  Has not walked significantly in 4-5 months and then with assist/RW and stooped posture ADL's / Homemaking Assistance Needed: Daughter performed ADLs and IADLs. Pt able to perform some grooming standing at sink and self feeding            OT Problem List: Decreased strength;Decreased activity tolerance;Decreased range of motion;Impaired balance (sitting and/or standing);Decreased knowledge of use of DME or AE;Decreased safety awareness;Decreased cognition;Impaired UE functional use      OT Treatment/Interventions: Self-care/ADL training;Therapeutic exercise;Energy conservation;DME and/or AE instruction;Therapeutic activities;Patient/family education    OT Goals(Current goals can be found in the care plan section) Acute Rehab OT Goals Patient Stated Goal: daughter wants pt to move better and ultimately go home OT Goal Formulation: With patient/family Time For Goal Achievement: 12/28/16 Potential to Achieve Goals: Good ADL  Goals Pt Will Perform Eating: with set-up;with supervision;sitting Pt Will Perform Grooming: with set-up;with supervision;sitting Pt Will Perform Upper Body Bathing: sitting;with min assist  OT Frequency: Min 2X/week   Barriers to D/C:            Co-evaluation              End of Session Equipment Utilized During Treatment: Gait belt;Other (comment) Huntley Dec stedy) Nurse Communication: Mobility status;Need for lift equipment  Activity Tolerance: Patient tolerated treatment well Patient left: in chair;with call bell/phone within reach;with chair alarm set;with family/visitor present  OT Visit Diagnosis: Unsteadiness on feet (R26.81);Muscle weakness (generalized) (M62.81);Other symptoms and signs involving cognitive function                Time: 1610-9604 OT Time Calculation (min): 33 min Charges:  OT General Charges $OT Visit: 1 Procedure OT Evaluation $OT Eval Moderate Complexity: 1 Procedure OT Treatments $Self Care/Home Management : 8-22 mins G-Codes:     [employer], OTR/L [phone]  Theodoro Grist Jazel Nimmons 12/14/2016, 5:55 PM

## 2016-12-15 DIAGNOSIS — G8194 Hemiplegia, unspecified affecting left nondominant side: Secondary | ICD-10-CM | POA: Diagnosis present

## 2016-12-15 DIAGNOSIS — R109 Unspecified abdominal pain: Secondary | ICD-10-CM | POA: Diagnosis not present

## 2016-12-15 DIAGNOSIS — D72829 Elevated white blood cell count, unspecified: Secondary | ICD-10-CM | POA: Diagnosis not present

## 2016-12-15 DIAGNOSIS — Z66 Do not resuscitate: Secondary | ICD-10-CM | POA: Diagnosis present

## 2016-12-15 DIAGNOSIS — E870 Hyperosmolality and hypernatremia: Secondary | ICD-10-CM | POA: Diagnosis not present

## 2016-12-15 DIAGNOSIS — I639 Cerebral infarction, unspecified: Secondary | ICD-10-CM | POA: Diagnosis not present

## 2016-12-15 DIAGNOSIS — Z515 Encounter for palliative care: Secondary | ICD-10-CM | POA: Diagnosis not present

## 2016-12-15 DIAGNOSIS — I10 Essential (primary) hypertension: Secondary | ICD-10-CM | POA: Diagnosis not present

## 2016-12-15 DIAGNOSIS — R4182 Altered mental status, unspecified: Secondary | ICD-10-CM | POA: Diagnosis not present

## 2016-12-15 DIAGNOSIS — E785 Hyperlipidemia, unspecified: Secondary | ICD-10-CM | POA: Diagnosis not present

## 2016-12-15 DIAGNOSIS — E860 Dehydration: Secondary | ICD-10-CM | POA: Diagnosis not present

## 2016-12-15 DIAGNOSIS — B9620 Unspecified Escherichia coli [E. coli] as the cause of diseases classified elsewhere: Secondary | ICD-10-CM | POA: Diagnosis not present

## 2016-12-15 DIAGNOSIS — B370 Candidal stomatitis: Secondary | ICD-10-CM | POA: Diagnosis not present

## 2016-12-15 DIAGNOSIS — R1032 Left lower quadrant pain: Secondary | ICD-10-CM | POA: Diagnosis not present

## 2016-12-15 DIAGNOSIS — N390 Urinary tract infection, site not specified: Secondary | ICD-10-CM | POA: Diagnosis not present

## 2016-12-15 DIAGNOSIS — I69391 Dysphagia following cerebral infarction: Secondary | ICD-10-CM | POA: Diagnosis not present

## 2016-12-15 DIAGNOSIS — R6889 Other general symptoms and signs: Secondary | ICD-10-CM | POA: Diagnosis not present

## 2016-12-15 DIAGNOSIS — I517 Cardiomegaly: Secondary | ICD-10-CM | POA: Diagnosis not present

## 2016-12-15 DIAGNOSIS — R2689 Other abnormalities of gait and mobility: Secondary | ICD-10-CM | POA: Diagnosis not present

## 2016-12-15 DIAGNOSIS — R6251 Failure to thrive (child): Secondary | ICD-10-CM | POA: Diagnosis not present

## 2016-12-15 DIAGNOSIS — R509 Fever, unspecified: Secondary | ICD-10-CM | POA: Diagnosis not present

## 2016-12-15 DIAGNOSIS — I129 Hypertensive chronic kidney disease with stage 1 through stage 4 chronic kidney disease, or unspecified chronic kidney disease: Secondary | ICD-10-CM | POA: Diagnosis present

## 2016-12-15 DIAGNOSIS — R531 Weakness: Secondary | ICD-10-CM | POA: Diagnosis not present

## 2016-12-15 DIAGNOSIS — Z87440 Personal history of urinary (tract) infections: Secondary | ICD-10-CM | POA: Diagnosis not present

## 2016-12-15 DIAGNOSIS — F0390 Unspecified dementia without behavioral disturbance: Secondary | ICD-10-CM | POA: Diagnosis present

## 2016-12-15 DIAGNOSIS — K219 Gastro-esophageal reflux disease without esophagitis: Secondary | ICD-10-CM | POA: Diagnosis not present

## 2016-12-15 DIAGNOSIS — N289 Disorder of kidney and ureter, unspecified: Secondary | ICD-10-CM | POA: Diagnosis not present

## 2016-12-15 DIAGNOSIS — R1310 Dysphagia, unspecified: Secondary | ICD-10-CM | POA: Diagnosis not present

## 2016-12-15 DIAGNOSIS — N183 Chronic kidney disease, stage 3 (moderate): Secondary | ICD-10-CM | POA: Diagnosis not present

## 2016-12-15 DIAGNOSIS — R488 Other symbolic dysfunctions: Secondary | ICD-10-CM | POA: Diagnosis not present

## 2016-12-15 DIAGNOSIS — J189 Pneumonia, unspecified organism: Secondary | ICD-10-CM | POA: Diagnosis not present

## 2016-12-15 DIAGNOSIS — A4151 Sepsis due to Escherichia coli [E. coli]: Secondary | ICD-10-CM | POA: Diagnosis not present

## 2016-12-15 DIAGNOSIS — G464 Cerebellar stroke syndrome: Secondary | ICD-10-CM | POA: Diagnosis not present

## 2016-12-15 DIAGNOSIS — G459 Transient cerebral ischemic attack, unspecified: Secondary | ICD-10-CM | POA: Diagnosis not present

## 2016-12-15 DIAGNOSIS — R278 Other lack of coordination: Secondary | ICD-10-CM | POA: Diagnosis not present

## 2016-12-15 DIAGNOSIS — R404 Transient alteration of awareness: Secondary | ICD-10-CM | POA: Diagnosis not present

## 2016-12-15 DIAGNOSIS — Z7902 Long term (current) use of antithrombotics/antiplatelets: Secondary | ICD-10-CM | POA: Diagnosis not present

## 2016-12-15 DIAGNOSIS — M6281 Muscle weakness (generalized): Secondary | ICD-10-CM | POA: Diagnosis not present

## 2016-12-15 DIAGNOSIS — R638 Other symptoms and signs concerning food and fluid intake: Secondary | ICD-10-CM | POA: Diagnosis present

## 2016-12-15 DIAGNOSIS — Z79899 Other long term (current) drug therapy: Secondary | ICD-10-CM | POA: Diagnosis not present

## 2016-12-15 DIAGNOSIS — R1319 Other dysphagia: Secondary | ICD-10-CM | POA: Diagnosis not present

## 2016-12-15 DIAGNOSIS — L89153 Pressure ulcer of sacral region, stage 3: Secondary | ICD-10-CM | POA: Diagnosis present

## 2016-12-15 DIAGNOSIS — E039 Hypothyroidism, unspecified: Secondary | ICD-10-CM | POA: Diagnosis not present

## 2016-12-15 DIAGNOSIS — I63512 Cerebral infarction due to unspecified occlusion or stenosis of left middle cerebral artery: Secondary | ICD-10-CM | POA: Diagnosis not present

## 2016-12-15 DIAGNOSIS — R627 Adult failure to thrive: Secondary | ICD-10-CM | POA: Diagnosis not present

## 2016-12-15 DIAGNOSIS — R1312 Dysphagia, oropharyngeal phase: Secondary | ICD-10-CM | POA: Diagnosis not present

## 2016-12-15 DIAGNOSIS — R7881 Bacteremia: Secondary | ICD-10-CM | POA: Diagnosis not present

## 2016-12-15 DIAGNOSIS — R0989 Other specified symptoms and signs involving the circulatory and respiratory systems: Secondary | ICD-10-CM | POA: Diagnosis not present

## 2016-12-15 DIAGNOSIS — R1084 Generalized abdominal pain: Secondary | ICD-10-CM | POA: Diagnosis not present

## 2016-12-15 DIAGNOSIS — M1990 Unspecified osteoarthritis, unspecified site: Secondary | ICD-10-CM | POA: Diagnosis present

## 2016-12-15 DIAGNOSIS — R41841 Cognitive communication deficit: Secondary | ICD-10-CM | POA: Diagnosis not present

## 2016-12-15 LAB — VAS US CAROTID
LCCADDIAS: -16 cm/s
LCCADSYS: -104 cm/s
LEFT ECA DIAS: -10 cm/s
LEFT VERTEBRAL DIAS: -18 cm/s
LICADDIAS: -14 cm/s
LICADSYS: -51 cm/s
LICAPSYS: -54 cm/s
Left CCA prox dias: 15 cm/s
Left CCA prox sys: 103 cm/s
Left ICA prox dias: -11 cm/s
RCCADSYS: -92 cm/s
RCCAPSYS: 83 cm/s
RIGHT ECA DIAS: -15 cm/s
RIGHT VERTEBRAL DIAS: -10 cm/s
Right CCA prox dias: 11 cm/s

## 2016-12-15 LAB — CULTURE, BLOOD (ROUTINE X 2)
CULTURE: NO GROWTH
SPECIAL REQUESTS: ADEQUATE

## 2016-12-15 MED ORDER — ATORVASTATIN CALCIUM 20 MG PO TABS
20.0000 mg | ORAL_TABLET | Freq: Every day | ORAL | Status: DC
Start: 2016-12-15 — End: 2017-01-02

## 2016-12-15 MED ORDER — AMOXICILLIN-POT CLAVULANATE 500-125 MG PO TABS: 1.0000 | ORAL_TABLET | Freq: Two times a day (BID) | ORAL | 0 refills | Status: AC

## 2016-12-15 MED ORDER — AMOXICILLIN-POT CLAVULANATE 500-125 MG PO TABS: 1.0000 | ORAL_TABLET | Freq: Two times a day (BID) | ORAL | Status: DC

## 2016-12-15 MED ORDER — RESOURCE THICKENUP CLEAR PO POWD: 1.0000 | ORAL | Status: DC | PRN

## 2016-12-15 MED ORDER — CLOPIDOGREL BISULFATE 75 MG PO TABS: 75.0000 mg | ORAL_TABLET | Freq: Every day | ORAL | Status: AC

## 2016-12-15 NOTE — Progress Notes (Signed)
Patient is medically stable for discharge today to [facility].  Dani Danis (daughter) has been notified of discharge and is agreeable for transport to facility by PTAR.  CSW Intern signing off as there are no other social work needs identified.  Please re-consult if any other social work needs arise prior to discharge.     Renard Hamper, CSW Intern Social Work

## 2016-12-15 NOTE — Care Management Note (Signed)
Case Management Note  Patient Details  Name: Raphael Fitzpatrick MRN: [id number] Date of Birth: August 08, 1919  Subjective/Objective:                 Patient admitted with generalized weakness. Anticipate DC to SNF. CSW following. No CM needs identified at this time.     Action/Plan:  Anticipate DC to SNF today as facilitated by CSW.  Expected Discharge Date:                  Expected Discharge Plan:  Skilled Nursing Facility  In-House Referral:  Clinical Social Work  Discharge planning Services  CM Consult  Post Acute Care Choice:    Choice offered to:     DME Arranged:    DME Agency:     HH Arranged:    HH Agency:     Status of Service:  Completed, signed off  If discussed at [employer], dates discussed:    Additional Comments:  Lawerance Sabal, RN 12/15/2016, 11:56 AM

## 2016-12-15 NOTE — Progress Notes (Signed)
Physical Therapy Treatment Patient Details Name: Bryce Potter MRN: [id number] DOB: 1918-09-21 Today's Date: 12/15/2016    History of Present Illness Bryce Potter is a 83 y.o. male with medical history significant for arthritis, dementia, Bryce Potter, hypertension, hypothyroidism,stroke, brought to the emergency department by his daughter, due to 1 to 2 day history of increased debilitation, and acute confusion.  Found to have UTI; Head imaging later shows Possible developing nonhemorrhagic left MCA territory infarct;  Remote lacunar infarcts are noted within the basal ganglia bilaterally    PT Comments    More awake and able to participate than previous session; Still very dependent for functional mobility; Minimal to no active movement noted L side; noting L side inattention as well; Discussed with family member to stimulate/talk to Mr. Chiquito from the L side; continue to agree with SNF for rehab to maximize independence and safety with mobility    Follow Up Recommendations  SNF     Equipment Recommendations  Other (comment) (TBD at SNF)    Recommendations for Other Services       Precautions / Restrictions Precautions Precautions: Fall Restrictions Weight Bearing Restrictions: No    Mobility  Bed Mobility Overal bed mobility: Needs Assistance Bed Mobility: Rolling;Sidelying to Sit Rolling: Max assist Sidelying to sit: Max assist;+2 for physical assistance       General bed mobility comments: A to bring BLE to EOB then Max A to lift trunk into sitting  Transfers Overall transfer level: Needs assistance Equipment used: 2 person hand held assist (Gait belt and bed pad) Transfers: Squat Pivot Transfers     Squat pivot transfers: +2 physical assistance;Total assist     General transfer comment: Required bil knees blocked for safety and stability during transfer; completely dependent transfer  Ambulation/Gait                 Stairs            Wheelchair  Mobility    Modified Rankin (Stroke Patients Only)       Balance Overall balance assessment: Needs assistance Sitting-balance support: Single extremity supported;Feet supported Sitting balance-Leahy Scale: Poor Sitting balance - Comments: required physical A to maintain sitting posture at EOB; worked from the [employer] on transitioning from supported sitting to unsupported sitting, heavy mod assist to [employer] and required assist to maintain position in unsupported sitting Postural control: Right lateral lean;Posterior lean Standing balance support: Bilateral upper extremity supported Standing balance-Leahy Scale: Zero Standing balance comment: assist to stay forward.  pt flexed forward and with flexed knees.                            Cognition Arousal/Alertness: Awake/alert Behavior During Therapy: Flat affect Overall Cognitive Status: History of cognitive impairments - at baseline                                        Exercises      General Comments        Pertinent Vitals/Pain Pain Assessment: Faces Faces Pain Scale: Hurts a little bit Pain Location: Non-specific; grimace likely related to effort Pain Descriptors / Indicators: Grimacing Pain Intervention(s): Monitored during session    Home Living                      Prior Function  PT Goals (current goals can now be found in the care plan section) Acute Rehab PT Goals Patient Stated Goal: daughter wants pt to move better and ultimately go home PT Goal Formulation: With patient/family Time For Goal Achievement: 12/24/16 Potential to Achieve Goals: Fair Progress towards PT goals: Progressing toward goals (Slowly)    Frequency    Min 3X/week      PT Plan Current plan remains appropriate    Co-evaluation             End of Session Equipment Utilized During Treatment: Gait belt Activity Tolerance: Patient tolerated treatment well Patient left: in  chair;with call bell/phone within reach;with chair alarm set Nurse Communication: Mobility status;Need for lift equipment PT Visit Diagnosis: Hemiplegia and hemiparesis;Unsteadiness on feet (R26.81);Muscle weakness (generalized) (M62.81) Hemiplegia - Right/Left: Left Hemiplegia - caused by: Cerebral infarction     Time: 1610-9604 PT Time Calculation (min) (ACUTE ONLY): 24 min  Charges:  $Therapeutic Activity: 23-37 mins                    G Codes:       Van Clines, PT  Acute Rehabilitation Services Pager [phone] Office [phone]    Levi Aland 12/15/2016, 12:07 PM

## 2016-12-15 NOTE — NC FL2 (Signed)
[place] MEDICAID FL2 LEVEL OF CARE SCREENING TOOL     IDENTIFICATION  Patient Name: Bryce Potter Birthdate: April 20, 1919 Sex: male Admission Date (Current Location): 12/10/2016  [facility] and [place] Number:  Producer, television/film/video and Address:  [facility], [address] [place], [place] [address]      Provider Number: [id number]  Attending Physician Name and Address:  Cleora Fleet, MD  Relative Name and Phone Number:   Muneer Leider (Daughter) - [phone]    Current Level of Care: Hospital Recommended Level of Care: Skilled Nursing Facility Prior Approval Number:    Date Approved/Denied:   PASRR Number: [id number]  Discharge Plan: SNF    Current Diagnoses: Patient Active Problem List   Diagnosis Date Noted  . Acute lacunar CVA (cerebrovascular accident)  12/14/2016  . TIA (transient ischemic attack) 12/12/2016  . E coli bacteremia 12/11/2016  . UTI (urinary tract infection) 12/10/2016  . Weakness generalized 08/25/2013  . FTT (failure to thrive) in adult 08/25/2013  . ARF (acute renal failure) (HCC) 08/25/2013  . CKD (chronic kidney disease), stage III 08/25/2013  . Cough 08/25/2013  . Hypothyroidism 08/25/2013    Orientation RESPIRATION BLADDER Height & Weight     Self, Place  Normal Incontinent (Patient has condom cath) Weight: 85.7 kg (189 lb) Height:   (185.4 cm)  BEHAVIORAL SYMPTOMS/MOOD NEUROLOGICAL BOWEL NUTRITION STATUS      Incontinent Diet (DYS 2)  AMBULATORY STATUS COMMUNICATION OF NEEDS Skin   Total Care (Patient did not ambulate with PT) Verbally Normal                       Personal Care Assistance Level of Assistance  Bathing, Feeding, Dressing Bathing Assistance: Maximum assistance Feeding assistance: Limited assistance Dressing Assistance: Maximum assistance     Functional Limitations Info  Sight, Hearing, Speech Sight Info: Adequate Hearing Info: Adequate Speech Info: Impaired (delayed  responses)    SPECIAL CARE FACTORS FREQUENCY  PT (By licensed PT), OT (By licensed OT), Speech therapy     PT Frequency: PT Eval completed on 12/10/2016 with recommendation of 3x/week minimum OT Frequency: OT Eval completed on 12/14/2016 with recommendation of 3x/week minimum     Speech Therapy Frequency: SLP Eval completed on 12/12/2016      Contractures Contractures Info: Not present    Additional Factors Info  Code Status, Allergies Code Status Info: Full Code Allergies Info: No Known Allergies           Current Medications (12/15/2016):  This is the current hospital active medication list Current Facility-Administered Medications  Medication Dose Route Frequency Provider Last Rate Last Dose  . acetaminophen (TYLENOL) tablet 650 mg  650 mg Oral Q6H PRN Marcos Eke, PA-C   650 mg at 12/10/16 2121   Or  . acetaminophen (TYLENOL) suppository 650 mg  650 mg Rectal Q6H PRN Marcos Eke, PA-C      . ampicillin-sulbactam (UNASYN) 1.5 g in sodium chloride 0.9 % 50 mL IVPB  1.5 g Intravenous Q6H Ann Held, RPH   1.5 g at 12/15/16 0537  . atorvastatin (LIPITOR) tablet 20 mg  20 mg Oral q1800 Clanford Cyndie Mull, MD   20 mg at 12/14/16 1722  . bisacodyl (DULCOLAX) suppository 10 mg  10 mg Rectal Daily PRN Marcos Eke, PA-C      . clopidogrel (PLAVIX) tablet 75 mg  75 mg Oral Daily Clanford Cyndie Mull, MD   75 mg  at 12/14/16 1246  . heparin injection 5,000 Units  5,000 Units Subcutaneous Q8H Clanford L Johnson, MD   5,000 Units at 12/15/16 0536  . hydrALAZINE (APRESOLINE) injection 10 mg  10 mg Intravenous Q8H PRN Haydee Salter, MD      . levothyroxine (SYNTHROID, LEVOTHROID) tablet 75 mcg  75 mcg Oral QAC breakfast Clanford Cyndie Mull, MD   75 mcg at 12/15/16 0756  . magnesium citrate solution 1 Bottle  1 Bottle Oral Once PRN Marcos Eke, PA-C      . metoprolol (LOPRESSOR) injection 5 mg  5 mg Intravenous Q6H PRN Clanford Cyndie Mull, MD      . metoprolol tartrate  (LOPRESSOR) tablet 25 mg  25 mg Oral BID Clanford Cyndie Mull, MD   25 mg at 12/14/16 2149  . ondansetron (ZOFRAN) tablet 4 mg  4 mg Oral Q6H PRN Marcos Eke, PA-C       Or  . ondansetron [facility]) injection 4 mg  4 mg Intravenous Q6H PRN Marcos Eke, PA-C      . pantoprazole (PROTONIX) EC tablet 40 mg  40 mg Oral Daily Marcos Eke, PA-C   40 mg at 12/14/16 1032  . RESOURCE THICKENUP CLEAR   Oral PRN Clanford Cyndie Mull, MD      . senna-docusate (Senokot-S) tablet 1 tablet  1 tablet Oral QHS PRN Marcos Eke, PA-C      . tamsulosin [facility]) capsule 0.4 mg  0.4 mg Oral QPC supper Clanford Cyndie Mull, MD   0.4 mg at 12/14/16 1721     Discharge Medications: Please see discharge summary for a list of discharge medications.  Relevant Imaging Results:  Relevant Lab Results:   Additional Information SSN:  [id number]  [employer] Work [phone]

## 2016-12-15 NOTE — Care Management Important Message (Signed)
Important Message  Patient Details  Name: Bryce Potter MRN: [id number] Date of Birth: 03-28-1919   Medicare Important Message Given:  Yes    Sierah Lacewell Stefan Church 12/15/2016, 2:33 PM

## 2016-12-15 NOTE — Progress Notes (Signed)
PROGRESS NOTE    Bryce Potter  BJY:[id number]  DOB: Jan 21, 1919  DOA: 12/10/2016 PCP: Boneta Lucks, NP Outpatient Specialists:  Hospital course: Bryce Potter is a 83 y.o. male with medical history significant for arthritis, dementia, [name], hypertension, hypothyroidism,stroke, brought to the emergency department by his daughter, due to 1 to 2 day history of increased debilitation, and acute confusion.   Assessment & Plan:   Acute metabolic encephalopathy likely due to UTI suggested by UA + large leukocytes in urine. Lactic acid 1.52  WBC is 7.3  CT head neg for acute findings.  Afebrile. Lactic acid is normal  Received IV hydration at 100 cc/h   In ED.   Clinically he is much improved.  E coli bacteremia - C&S from UA sensitive to all except cipro, blood culture C&S same.  Start oral augmentin to complete full course of treatment.   Acute lacunar CVA - Pt had an episode 4/14 and Code Stroke was called but symptoms resolved within 3 hours and no tPA was given.  He is now on plavix daily per stroke team.  He has new diagnosis of DM type 2 with A1c 6.6% which will be managed with diet.  Lipid panel with elevated LDL. Atorvastatin 20 mg daily started.   Hypertension BP 132/71   Pulse 69  Controlled Resuming home metoprolol 25 mg BID   Aspiration - SLP evaluation appreciated, MBS completed and patient on dysphagia diet - see SLP notes and recommendations Observe aspiration precautions. MBS study completed 4/16.    Chronic kidney disease stage 3 baseline creatinine    Current Cr 1.49 improved with hydration Recent Labs       Lab Results  Component Value Date   CREATININE 1.73 (H) 12/10/2016   CREATININE 1.55 (H) 08/26/2013   CREATININE 1.80 (H) 08/25/2013     Hypothyroidism: Continue home Synthroid  Deconditioning and acute CVA PT/OT consult recommending SNF and family in agreement  DVT prophylaxis: SCDs   Code Status:   Full    Family Communication:  Discussed with  daughter Disposition Plan:  confirmed with daughter SNF consulted CSW 4/15 Admission status Medsurg  Subjective: Pt much more alert and talkative.  Pt is eating and drinking.  He has been difficult to ambulate  Objective: Vitals:   12/15/16 0634 12/15/16 0911 12/15/16 0922 12/15/16 1023  BP: 120/85 (!) 133/54 (!) 148/72   Pulse: 77 70 75 74  Resp: 19 18    Temp: 98.7 F (37.1 C) 98.7 F (37.1 C)  98.9 F (37.2 C)  TempSrc: Oral Oral  Oral  SpO2: 99% 99%  97%  Weight:      Height:        Intake/Output Summary (Last 24 hours) at 12/15/16 1226 Last data filed at 12/15/16 1125  Gross per 24 hour  Intake              270 ml  Output             2150 ml  Net            -1880 ml   Filed Weights   12/12/16 2041 12/14/16 0051 12/14/16 2142  Weight: 88.5 kg (195 lb 3.2 oz) 88.5 kg (195 lb) 85.7 kg (189 lb)    Exam:  General exam: awake, alert, NAD, facial droop persists, drooling improved. Respiratory system:  No increased work of breathing. Cardiovascular system: S1 & S2 heard. Gastrointestinal system: Abdomen is nondistended, soft and nontender. Normal bowel sounds heard. Central nervous system:  Alert. No focal neurological deficits. Extremities: no cyanosis.  Data Reviewed: Basic Metabolic Panel:  Recent Labs Lab 12/10/16 1103 12/11/16 0446 12/12/16 0923 12/14/16 0509  NA 141 142 137 140  K 4.4 4.4 3.8 3.9  CL 109 111 105 108  CO2 21*  GLUCOSE 122* 127* 112* 86  BUN 21* 21* 19 20  CREATININE 1.73* 1.75* 1.49* 1.51*  CALCIUM 9.0 8.3* 8.5* 8.8*   Liver Function Tests:  Recent Labs Lab 12/10/16 1103 12/11/16 0446 12/12/16 0923  AST ALT ALKPHOS 57 55 50  BILITOT 0.4 0.4 0.7  PROT 7.6 6.6 6.9  ALBUMIN 3.2* 2.7* 2.9*   No results for input(s): LIPASE, AMYLASE in the last 168 hours.  Recent Labs Lab 12/10/16 1421  AMMONIA 17   CBC:  Recent Labs Lab 12/10/16 1103 12/11/16 0446 12/12/16 0923 12/14/16 0509  WBC 7.3  15.9* 8.5 6.7  NEUTROABS 4.2  --   --   --   HGB 11.8* 10.4* 10.4* 11.1*  HCT 36.6* 32.9* 32.3* 34.1*  MCV 91.7 92.4 90.7 90.2  PLT 234 215 223 234   Cardiac Enzymes: No results for input(s): CKTOTAL, CKMB, CKMBINDEX, TROPONINI in the last 168 hours. CBG (last 3)  No results for input(s): GLUCAP in the last 72 hours. Recent Results (from the past 240 hour(s))  Urine culture     Status: Abnormal   Collection Time: 12/10/16 10:50 AM  Result Value Ref Range Status   Specimen Description URINE, CATHETERIZED  Final   Special Requests NONE  Final   Culture >=100,000 COLONIES/mL ESCHERICHIA COLI (A)  Final   Report Status 12/12/2016 FINAL  Final   Organism ID, Bacteria ESCHERICHIA COLI (A)  Final      Susceptibility   Escherichia coli - MIC*    AMPICILLIN <=2 SENSITIVE Sensitive     CEFAZOLIN <=4 SENSITIVE Sensitive     CEFTRIAXONE <=1 SENSITIVE Sensitive     CIPROFLOXACIN >=4 RESISTANT Resistant     GENTAMICIN <=1 SENSITIVE Sensitive     IMIPENEM <=0.25 SENSITIVE Sensitive     NITROFURANTOIN <=16 SENSITIVE Sensitive     TRIMETH/SULFA <=20 SENSITIVE Sensitive     AMPICILLIN/SULBACTAM <=2 SENSITIVE Sensitive     PIP/TAZO <=4 SENSITIVE Sensitive     Extended ESBL NEGATIVE Sensitive     * >=100,000 COLONIES/mL ESCHERICHIA COLI  Culture, blood (Routine x 2)     Status: Abnormal   Collection Time: 12/10/16 11:06 AM  Result Value Ref Range Status   Specimen Description BLOOD RIGHT ANTECUBITAL  Final   Special Requests   Final    BOTTLES DRAWN AEROBIC AND ANAEROBIC Blood Culture adequate volume   Culture  Setup Time   Final    GRAM NEGATIVE RODS ANAEROBIC BOTTLE ONLY CRITICAL RESULT CALLED TO, READ BACK BY AND VERIFIED WITH: JAMES LEDFORD, PHARMD  12/11/16 MKELLY,MLT    Culture ESCHERICHIA COLI (A)  Final   Report Status 12/13/2016 FINAL  Final   Organism ID, Bacteria ESCHERICHIA COLI  Final      Susceptibility   Escherichia coli - MIC*    AMPICILLIN <=2 SENSITIVE Sensitive       CEFAZOLIN <=4 SENSITIVE Sensitive     CEFEPIME <=1 SENSITIVE Sensitive     CEFTAZIDIME <=1 SENSITIVE Sensitive     CEFTRIAXONE <=1 SENSITIVE Sensitive     CIPROFLOXACIN >=4 RESISTANT Resistant     GENTAMICIN <=1 SENSITIVE Sensitive     IMIPENEM <=0.25 SENSITIVE Sensitive  TRIMETH/SULFA <=20 SENSITIVE Sensitive     AMPICILLIN/SULBACTAM <=2 SENSITIVE Sensitive     PIP/TAZO <=4 SENSITIVE Sensitive     Extended ESBL NEGATIVE Sensitive     * ESCHERICHIA COLI  Blood Culture ID Panel (Reflexed)     Status: Abnormal   Collection Time: 12/10/16 11:06 AM  Result Value Ref Range Status   Enterococcus species NOT DETECTED NOT DETECTED Final   Listeria monocytogenes NOT DETECTED NOT DETECTED Final   Staphylococcus species NOT DETECTED NOT DETECTED Final   Staphylococcus aureus NOT DETECTED NOT DETECTED Final   Streptococcus species NOT DETECTED NOT DETECTED Final   Streptococcus agalactiae NOT DETECTED NOT DETECTED Final   Streptococcus pneumoniae NOT DETECTED NOT DETECTED Final   Streptococcus pyogenes NOT DETECTED NOT DETECTED Final   Acinetobacter baumannii NOT DETECTED NOT DETECTED Final   Enterobacteriaceae species DETECTED (A) NOT DETECTED Final    Comment: Enterobacteriaceae represent a large family of gram-negative bacteria, not a single organism. CRITICAL RESULT CALLED TO, READ BACK BY AND VERIFIED WITH: JAMES LEDFORD, PHARMD  12/11/16 MKELLY,MLT    Enterobacter cloacae complex NOT DETECTED NOT DETECTED Final   Escherichia coli DETECTED (A) NOT DETECTED Final    Comment: CRITICAL RESULT CALLED TO, READ BACK BY AND VERIFIED WITH: JAMES LEDFORD, PHARMD  12/11/16 MKELLY,MLT    Klebsiella oxytoca NOT DETECTED NOT DETECTED Final   Klebsiella pneumoniae NOT DETECTED NOT DETECTED Final   Proteus species NOT DETECTED NOT DETECTED Final   Serratia marcescens NOT DETECTED NOT DETECTED Final   Carbapenem resistance NOT DETECTED NOT DETECTED Final   Haemophilus influenzae NOT  DETECTED NOT DETECTED Final   Neisseria meningitidis NOT DETECTED NOT DETECTED Final   Pseudomonas aeruginosa NOT DETECTED NOT DETECTED Final   Candida albicans NOT DETECTED NOT DETECTED Final   Candida glabrata NOT DETECTED NOT DETECTED Final   Candida krusei NOT DETECTED NOT DETECTED Final   Candida parapsilosis NOT DETECTED NOT DETECTED Final   Candida tropicalis NOT DETECTED NOT DETECTED Final  Culture, blood (Routine x 2)     Status: None   Collection Time: 12/10/16 11:16 AM  Result Value Ref Range Status   Specimen Description BLOOD LEFT ANTECUBITAL  Final   Special Requests IN PEDIATRIC BOTTLE Blood Culture adequate volume  Final   Culture NO GROWTH 5 DAYS  Final   Report Status 12/15/2016 FINAL  Final     Studies: Bryce Potter ZO Contrast  Result Date: 12/13/2016 CLINICAL DATA:  83 year old male with altered mental status. Possible early left MCA territory infarct on noncontrast head CT today. EXAM: MRI HEAD WITHOUT CONTRAST MRA HEAD WITHOUT CONTRAST TECHNIQUE: Multiplanar, multiecho pulse sequences of the brain and surrounding structures were obtained without intravenous contrast. Angiographic images of the head were obtained using MRA technique without contrast. COMPARISON:  Head CT without contrast 1409 hours. FINDINGS: MRI HEAD FINDINGS The examination had to be discontinued prior to completion due to patient coughing, confusion, and agitation. Axial and coronal diffusion weighted imaging plus axial T2, FLAIR, and T2* imaging was obtained and is intermittently degraded by motion artifact. Several Subcentimeter foci of restricted diffusion in the right posterior corona radiata and right lentiform nuclei (series 4 images 27-31. No other convincing restricted diffusion identified. Mild associated T2 and FLAIR hyperintensity with the acute diffusion findings. Superimposed chronic lacunar infarcts in the right basal ganglia and thalamus. Superimposed confluent bilateral cerebral white  matter T2 and FLAIR hyperintensity with scattered areas of chronic white matter lacunar infarcts (series 6, image 19). No definite  chronic cerebral blood products. No midline shift. No extra-axial collection or acute intracranial hemorrhage identified. Stable ventricle size and configuration. Patent basilar cisterns. MRA HEAD FINDINGS Study is intermittently degraded by motion artifact despite repeated imaging attempts. Antegrade flow in the distal left vertebral artery, but no antegrade flow signal in the distal right vertebral artery. Antegrade flow in the basilar without stenosis. PCA origins, P1 and P2 segments bilaterally are patent. Posterior communicating arteries are diminutive or absent. Antegrade flow in both ICA siphons. Bilateral carotid termini are patent. Both MCA and ACA origins appear patent. But insufficient bilateral MCA and ACA branch detail otherwise. IMPRESSION: 1. Prematurely discontinued and intermittently motion degraded study due to patient agitation. 2. Scattered small acute lacunar infarcts in the right corona radiata and posterior right deep gray matter nuclei. No associated hemorrhage or mass effect. 3. No strong evidence of emergent large vessel occlusion. 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 4. Underlying chronic small vessel disease in the bilateral cerebral white matter and right deep gray matter nuclei. Electronically Signed   By: Odessa Fleming M.D.   On: 12/13/2016 14:56   Bryce Brain Wo Contrast  Result Date: 12/13/2016 CLINICAL DATA:  83 year old male with altered mental status. Possible early left MCA territory infarct on noncontrast head CT today. EXAM: MRI HEAD WITHOUT CONTRAST MRA HEAD WITHOUT CONTRAST TECHNIQUE: Multiplanar, multiecho pulse sequences of the brain and surrounding structures were obtained without intravenous contrast. Angiographic images of the head were obtained using MRA technique without contrast. COMPARISON:  Head CT without contrast  1409 hours. FINDINGS: MRI HEAD FINDINGS The examination had to be discontinued prior to completion due to patient coughing, confusion, and agitation. Axial and coronal diffusion weighted imaging plus axial T2, FLAIR, and T2* imaging was obtained and is intermittently degraded by motion artifact. Several Subcentimeter foci of restricted diffusion in the right posterior corona radiata and right lentiform nuclei (series 4 images 27-31. No other convincing restricted diffusion identified. Mild associated T2 and FLAIR hyperintensity with the acute diffusion findings. Superimposed chronic lacunar infarcts in the right basal ganglia and thalamus. Superimposed confluent bilateral cerebral white matter T2 and FLAIR hyperintensity with scattered areas of chronic white matter lacunar infarcts (series 6, image 19). No definite chronic cerebral blood products. No midline shift. No extra-axial collection or acute intracranial hemorrhage identified. Stable ventricle size and configuration. Patent basilar cisterns. MRA HEAD FINDINGS Study is intermittently degraded by motion artifact despite repeated imaging attempts. Antegrade flow in the distal left vertebral artery, but no antegrade flow signal in the distal right vertebral artery. Antegrade flow in the basilar without stenosis. PCA origins, P1 and P2 segments bilaterally are patent. Posterior communicating arteries are diminutive or absent. Antegrade flow in both ICA siphons. Bilateral carotid termini are patent. Both MCA and ACA origins appear patent. But insufficient bilateral MCA and ACA branch detail otherwise. IMPRESSION: 1. Prematurely discontinued and intermittently motion degraded study due to patient agitation. 2. Scattered small acute lacunar infarcts in the right corona radiata and posterior right deep gray matter nuclei. No associated hemorrhage or mass effect. 3. No strong evidence of emergent large vessel occlusion. Suspect chronic occlusion of the distal right  vertebral artery. 4. Underlying chronic small vessel disease in the bilateral cerebral white matter and right deep gray matter nuclei. Electronically Signed   By: Odessa Fleming M.D.   On: 12/13/2016 14:56   Dg Swallowing Func-speech Pathology  Result Date: 12/14/2016 Objective Swallowing Evaluation: Type of Study: MBS-Modified Barium Swallow Study Patient Details  Name: Bryce Potter MRN: [id number] Date of Birth: Feb 25, 1919 Today's Date: 12/14/2016 Time: SLP Start Time (ACUTE ONLY): 1329-SLP Stop Time (ACUTE ONLY): 1343 SLP Time Calculation (min) (ACUTE ONLY): 14 min Past Medical History: Past Medical History: Diagnosis Date . Arthritis  . Dementia  . GERD (gastroesophageal reflux disease)  . Hypertension  . Hypothyroidism  . Stroke [facility]) in age 64's - 26's . UTI (urinary tract infection) 12/10/2016 Past Surgical History: Past Surgical History: Procedure Laterality Date . CATARACT EXTRACTION Bilateral  HPI: Bryce Potter a 83 y.o.malewith medical history significant forarthritis, dementia, [name], hypertension, hypothyroidism,stroke, brought to the emergency department by his daughter, due to 1 to 2 day history of increased debilitation, and acute confusion. Chest x ray grossly clear. MRI showed scattered small acute lacunar infarcts in the right corona radiata and posterior right deep gray matter nuclei. No Data Recorded Assessment / Plan / Recommendation CHL IP CLINICAL IMPRESSIONS 12/14/2016 Clinical Impression Bryce Potter presents with a moderate oral and moderate sensorimotor pharyngeal dysphagia. Pt required assistance for feeding as well as verbal cueing and reminders throughout the MBS to "swallow". Oral phase impairments > pharyngeal impairments consisting of delayed oral transit/holding, lingual/palatal residue, and poor labial closure resulting in anterior spillage. Pt reported that he has partial dentures in his room, although they were unavailable during this study. Observed moderate vallecular residue with  each tested consistency that was ineffectively cleared with cues for second swallows. Cup sips of thin liquids resulted in silent penetration to the level of the vocal cords with a weak, cued cough that did not clear penetrates. Compensatory strategies or postural changes were not attempted due to pt's hx dementia and unlikely compliance/carryover. Educated pt and daughter re: results of MBS and diet recomendations. Recommend Dys 2 solids, nectar thick liquids (no straws), meds crushed, lingual sweep for clearance of pocketing. ST will f/u briefly for continued education and for diet tolerance. SLP Visit Diagnosis Dysphagia, oropharyngeal phase (R13.12) Attention and concentration deficit following -- Frontal lobe and executive function deficit following -- Impact on safety and function Moderate aspiration risk   CHL IP TREATMENT RECOMMENDATION 12/14/2016 Treatment Recommendations Therapy as outlined in treatment plan below   Prognosis 12/14/2016 Prognosis for Safe Diet Advancement Fair Barriers to Reach Goals Cognitive deficits Barriers/Prognosis Comment -- CHL IP DIET RECOMMENDATION 12/14/2016 SLP Diet Recommendations Dysphagia 2 (Fine chop) solids;Nectar thick liquid Liquid Administration via Cup;No straw Medication Administration Crushed with puree Compensations Slow rate;Small sips/bites;Minimize environmental distractions;Monitor for anterior loss;Lingual sweep for clearance of pocketing Postural Changes Seated upright at 90 degrees   CHL IP OTHER RECOMMENDATIONS 12/14/2016 Recommended Consults -- Oral Care Recommendations Oral care BID Other Recommendations Order thickener from pharmacy   CHL IP FOLLOW UP RECOMMENDATIONS 12/14/2016 Follow up Recommendations Skilled Nursing facility   [facility] IP FREQUENCY AND DURATION 12/14/2016 Speech Therapy Frequency (ACUTE ONLY) min 2x/week Treatment Duration 1 week      CHL IP ORAL PHASE 12/14/2016 Oral Phase Impaired Oral - Pudding Teaspoon -- Oral - Pudding Cup -- Oral - Honey  Teaspoon -- Oral - Honey Cup -- Oral - Nectar Teaspoon -- Oral - Nectar Cup Delayed oral transit;Piecemeal swallowing;Lingual/palatal residue;Left anterior bolus loss;Right anterior bolus loss;Holding of bolus Oral - Nectar Straw -- Oral - Thin Teaspoon -- Oral - Thin Cup Holding of bolus;Lingual/palatal residue;Piecemeal swallowing;Delayed oral transit;Left anterior bolus loss;Right anterior bolus loss Oral - Thin Straw -- Oral - Puree -- Oral - Mech Soft Delayed oral transit;Impaired mastication;Other (Comment) Oral - Regular -- Oral - Multi-Consistency --  Oral - Pill -- Oral Phase - Comment --  CHL IP PHARYNGEAL PHASE 12/14/2016 Pharyngeal Phase Impaired Pharyngeal- Pudding Teaspoon -- Pharyngeal -- Pharyngeal- Pudding Cup -- Pharyngeal -- Pharyngeal- Honey Teaspoon -- Pharyngeal -- Pharyngeal- Honey Cup -- Pharyngeal -- Pharyngeal- Nectar Teaspoon -- Pharyngeal -- Pharyngeal- Nectar Cup Pharyngeal residue - valleculae;Reduced epiglottic inversion;Delayed swallow initiation-vallecula;Delayed swallow initiation-pyriform sinuses Pharyngeal -- Pharyngeal- Nectar Straw -- Pharyngeal -- Pharyngeal- Thin Teaspoon -- Pharyngeal -- Pharyngeal- Thin Cup Penetration/Aspiration during swallow;Pharyngeal residue - valleculae;Delayed swallow initiation-vallecula;Delayed swallow initiation-pyriform sinuses;Reduced epiglottic inversion Pharyngeal Material enters airway, CONTACTS cords and not ejected out Pharyngeal- Thin Straw -- Pharyngeal -- Pharyngeal- Puree -- Pharyngeal -- Pharyngeal- Mechanical Soft WFL Pharyngeal -- Pharyngeal- Regular -- Pharyngeal -- Pharyngeal- Multi-consistency -- Pharyngeal -- Pharyngeal- Pill -- Pharyngeal -- Pharyngeal Comment --  CHL IP CERVICAL ESOPHAGEAL PHASE 12/14/2016 Cervical Esophageal Phase WFL Pudding Teaspoon -- Pudding Cup -- Honey Teaspoon -- Honey Cup -- Nectar Teaspoon -- Nectar Cup -- Nectar Straw -- Thin Teaspoon -- Thin Cup -- Thin Straw -- Puree -- Mechanical Soft -- Regular --  Multi-consistency -- Pill -- Cervical Esophageal Comment -- No flowsheet data found. Bryce Potter 12/14/2016, 2:59 PM Breck Coons Lonell Face.Ed CCC-SLP Pager [phone]              Scheduled Meds: . amoxicillin-clavulanate  1 tablet Oral BID WC  . atorvastatin  20 mg Oral q1800  . clopidogrel  75 mg Oral Daily  . heparin  5,000 Units Subcutaneous Q8H  . levothyroxine  75 mcg Oral QAC breakfast  . metoprolol tartrate  25 mg Oral BID  . pantoprazole  40 mg Oral Daily  . tamsulosin  0.4 mg Oral QPC supper   Continuous Infusions:  Active Problems:   E coli bacteremia   TIA (transient ischemic attack)   Weakness generalized   FTT (failure to thrive) in adult   CKD (chronic kidney disease), stage III   Hypothyroidism   UTI (urinary tract infection)   Acute lacunar CVA (cerebrovascular accident)   Time spent:   Standley Dakins, MD, FAAFP [facility] Pager [phone]  If 7PM-7AM, please contact night-coverage [email or web address] Password [email or web address] 12/15/2016, 12:26 PM    LOS: 4 days

## 2016-12-15 NOTE — Progress Notes (Signed)
Pt prepared for d/c to SNF. IV d/c'd. Skin intact except as charted in most recent assessments. Vitals are stable.   Called report called to Floyd @ receiving facility [facility] - SNF). Pt to be transported by ambulance service.  Peri Maris, MBA, BSN, RN

## 2016-12-15 NOTE — Clinical Social Work Note (Signed)
Clinical Social Work Assessment  Patient Details  Name: Bryce Potter MRN: [id number] Date of Birth: 12/14/1918  Date of referral:  12/15/16               Reason for consult:  Facility Placement                Permission sought to share information with:  Family Supports Permission granted to share information::  Yes, Verbal Permission Granted  Name::     Bryce Potter  Agency::     Relationship::  Daughter  Contact Information:  [phone]  Housing/Transportation Living arrangements for the past 2 months:  Single Family Home Source of Information:  Adult Children Patient Interpreter Needed:  None Criminal Activity/Legal Involvement Pertinent to Current Situation/Hospitalization:  No - Comment as needed Significant Relationships:  Adult Children Lives with:  Self Do you feel safe going back to the place where you live?  No Need for family participation in patient care:  Yes (Comment)  Care giving concerns:  Patient's daughter Bryce Potter is agreeable for short term rehab after hospital discharge.    Social Worker assessment / plan:  CSW Intern spoke with patient with daughter Bryce Potter at bedside about discharge plans.  Patient was alert, sitting up in bed and did not participate in conversation.  Patient's daughter stated that she would like for her father to discharge to [facility].  CSW Intern explained the facility search process and encouraged Bryce Potter to choose a backup facility in the event that [facility] was unable to accept the patient.  Bryce Potter expressed interest in [facility] as a second option.  CSW Intern will follow back up with patient and daughter regarding facility offers.    Employment status:  Retired Health and safety inspector:  Medicare PT Recommendations:  Skilled Nursing Facility Information / Referral to community resources:     Patient/Family's Response to care:  Patient and daughter expressed no care giving concerns regarding hospital  stay.  Patient/Family's Understanding of and Emotional Response to Diagnosis, Current Treatment, and Prognosis:  Not Discussed  Emotional Assessment Appearance:  Appears younger than stated age Attitude/Demeanor/Rapport:  Other (Appropriate) Affect (typically observed):  Appropriate Orientation:  Oriented to Self, Oriented to Place Alcohol / Substance use:  Not Applicable Psych involvement (Current and /or in the community):  No (Comment)  Discharge Needs  Concerns to be addressed:  Discharge Planning Concerns Readmission within the last 30 days:  No Current discharge risk:  None Barriers to Discharge:  No Barriers Identified   Renard Hamper, Student-Social Work 12/15/2016, 11:47 AM

## 2016-12-15 NOTE — Discharge Summary (Signed)
Physician Discharge Summary  Bryce Potter ZOX:[id number] DOB: 11/29/1918 DOA: 12/10/2016  PCP: Boneta Lucks, NP  Admit date: 12/10/2016 Discharge date: 12/15/2016  Admitted From: Home  Disposition:  SNF  Recommendations for Outpatient Follow-up:  1. Monitor blood sugar 1-2 times per day, DC testing if BS remain stable  2. Continue augmentin for 9 more days thru 12/24/16  Discharge Condition: STABLE  CODE STATUS: FULL   DIET:  SPEECH THERAPY RECOMMENDATIONS Brief recommendations include the following: Clinical Impression Bryce Potter presents with a moderate oral and moderate sensorimotor pharyngeal dysphagia. Pt required assistance for feeding as well as verbal cueing and reminders throughout the MBS to "swallow". Oral phase impairments > pharyngeal impairments consisting of delayed oral transit/holding, lingual/palatal residue, and poor labial closure resulting in anterior spillage. Pt reported that he has partial dentures in his room, although they were unavailable during this study. Observed moderate vallecular residue with each tested consistency that was ineffectively cleared with cues for second swallows. Cup sips of thin liquids resulted in silent penetration to the level of the vocal cords with a weak, cued cough that did not clear penetrates. Compensatory strategies or postural changes were not attempted due to pt's hx dementia and unlikely compliance/carryover. Educated pt and daughter re: results of MBS and diet recomendations. Recommend Dys 2 solids, nectar thick liquids (no straws), meds crushed, lingual sweep for clearance of pocketing. ST will f/u briefly for continued education and for diet tolerance.   Swallow Evaluation Recommendations   SLP Diet Recommendations: Dysphagia 2 (Fine chop) solids;Nectar thick liquid   Liquid Administration via: Cup;No straw   Medication Administration: Crushed with puree   Supervision: Full assist for feeding;Full supervision/cueing for  compensatory strategies   Compensations: Slow rate;Small sips/bites;Minimize environmental distractions;Monitor for anterior loss;Lingual sweep for clearance of pocketing   Postural Changes: Seated upright at 90 degrees   Oral Care Recommendations: Oral care BID   Other Recommendations: Order thickener from pharmacy  Brief/Interim Summary: HPI: Bryce Potter is a 83 y.o. male with medical history significant for arthritis, dementia, [name], hypertension, hypothyroidism,stroke, brought to the emergency department by his daughter, due to 1 to 2 day history of increased debilitation, and acute confusion. Daughter reports strong urine odsor. History is obtained by her, as the patient is somewhat confused at this time.Denies fevers, chills, night sweats, vision changes, or mucositis. Denies any respiratory complaints. Denies any chest pain or palpitations. Denies lower extremity swelling. Denies nausea, heartburn or change in bowel habits.,Appetite is normal. Denies any dysuria, he does have increased frequency. Denies abnormal skin rashes, or neuropathy. Denies any bleeding issues such as epistaxis, hematemesis, hematuria or hematochezia.   ED Course:  BP 132/71   Pulse 69   Temp 98.3 F (36.8 C) (Rectal)   Resp 20   Ht  (1.854 m)   Wt 95.3 kg (210 lb)   SpO2 99%   BMI 27.71 kg/m    lactic acid 1.52 sodium 141 potassium 4.4 bicarb 24 BUN 21 creatinine 1.73 calcium 9  Anion gap  8 GFR 37  lactic acid 1.52 white count 7.3 hemoglobin 11.8 platelets 234 CT of the head without acute intracranial abnormality  chest x-ray essentially unremarkable  urine with large leukocytes negative nitrites  Assessment & Plan:   Acute metabolic encephalopathy likely due to UTI suggested by UA + large leukocytes in urine. Lactic acid 1.52 WBC is 7.3  CT head neg for acute findings. Afebrile. Lactic acid is normal  Clinically he is much improved.  E coli  bacteremia - C&S from UA sensitive to all  except cipro, blood culture C&S same.  Start oral augmentin to complete full course of treatment.   Acute lacunar CVA - Pt had an AMS episode 4/14 and Code Stroke was called but symptoms resolved within 3 hours and no tPA was given.  He is now on plavix daily per stroke team.  He has new diagnosis of DM type 2 with A1c 6.6% which will be managed with diet.  Lipid panel with elevated LDL. Atorvastatin 20 mg daily started.   Hypertension BP 132/71  Pulse 69 Controlled Resumed home metoprolol 25 mg BID  Aspiration - SLP evaluation appreciated, MBS completed and patient on dysphagia diet - see SLP notes and recommendations Observe aspiration precautions. MBS study completed 4/16.    Chronic kidney disease stage 3baseline creatinine Current Cr 1.49 improved with hydration Recent Labs       Lab Results  Component Value Date   CREATININE 1.73 (H) 12/10/2016   CREATININE 1.55 (H) 08/26/2013   CREATININE 1.80 (H) 08/25/2013     Hypothyroidism: Continue home Synthroid  Deconditioning and acute CVA PT/OT consult recommending SNF and family in agreement  DVT prophylaxis:SCDs  Code Status:Full  Family Communication:Discussed with daughter Disposition Plan: confirmed with daughter SNF consulted CSW 4/15 Admission status Medsurg  Discharge Diagnoses:  Active Problems:   E coli bacteremia   TIA (transient ischemic attack)   Weakness generalized   FTT (failure to thrive) in adult   CKD (chronic kidney disease), stage III   Hypothyroidism   UTI (urinary tract infection)   Acute lacunar CVA (cerebrovascular accident)   Discharge Instructions  Discharge Instructions    Increase activity slowly    Complete by:  As directed      Allergies as of 12/15/2016   No Known Allergies     Medication List    STOP taking these medications   aspirin EC 81 MG tablet   dextromethorphan-guaiFENesin 30-600 MG 12hr tablet Commonly known as:  MUCINEX DM     TAKE  these medications   amoxicillin-clavulanate 500-125 MG tablet Commonly known as:  AUGMENTIN Take 1 tablet (500 mg total) by mouth 2 (two) times daily with a meal.   atorvastatin 20 MG tablet Commonly known as:  LIPITOR Take 1 tablet (20 mg total) by mouth daily at 6 PM.   clopidogrel 75 MG tablet Commonly known as:  PLAVIX Take 1 tablet (75 mg total) by mouth daily. Start taking on:  12/16/2016   levothyroxine 75 MCG tablet Commonly known as:  SYNTHROID, LEVOTHROID Take 75 mcg by mouth daily before breakfast.   metoprolol tartrate 25 MG tablet Commonly known as:  LOPRESSOR Take 25 mg by mouth 2 (two) times daily.   omega-3 acid ethyl esters 1 g capsule Commonly known as:  LOVAZA Take 1 g by mouth daily.   omeprazole 40 MG capsule Commonly known as:  PRILOSEC Take 40 mg by mouth daily.   RESOURCE THICKENUP CLEAR Powd Take 120 g by mouth as needed.   tamsulosin 0.4 MG Caps capsule Commonly known as:  FLOMAX Take 0.4 mg by mouth daily after supper.      Contact information for after-discharge care    Destination    [facility] .   Specialty:  Skilled Nursing Facility Contact information: [address] [place] [address] [phone]             No Known Allergies  Procedures/Studies: Dg Chest 2 View  Result Date:  12/10/2016 CLINICAL DATA:  Altered mental status, history of stroke EXAM: CHEST  2 VIEW COMPARISON:  07/06/2014 FINDINGS: Borderline cardiomegaly noted. No pulmonary edema. There is left basilar atelectasis or infiltrate. Degenerative changes thoracic spine again noted. IMPRESSION: No pulmonary edema. Left basilar atelectasis on infiltrate. Degenerative changes thoracic spine. Electronically Signed   By: Natasha Mead M.D.   On: 12/10/2016 12:05   Ct Head Wo Contrast  Result Date: 12/10/2016 CLINICAL DATA:  Altered mental status, increasing lethargy and weakness EXAM: CT HEAD WITHOUT CONTRAST TECHNIQUE: Contiguous axial images  were obtained from the base of the skull through the vertex without intravenous contrast. COMPARISON:  CT brain scan of 08/25/2013 FINDINGS: Brain: Ventriculomegaly is unchanged as is diffuse cortical atrophy. The septum is midline in position. Moderately severe small vessel ischemic change throughout the periventricular white matter is unchanged as well. No hemorrhage, mass lesion, or acute infarction is seen. Vascular: No vascular abnormality is seen on this unenhanced study. Skull: No calvarial abnormality is seen. Sinuses/Orbits: There is mild mucosal thickening within the maxillary sinuses but no present sinusitis is seen. Other: None. IMPRESSION: 1. Stable atrophy and significant small vessel ischemic change throughout the periventricular white matter. No acute intracranial abnormality. 2. Mild mucosal thickening in the maxillary sinuses. Electronically Signed   By: Dwyane Dee M.D.   On: 12/10/2016 11:53   Mr Maxine Glenn Head Wo Contrast  Result Date: 12/13/2016 CLINICAL DATA:  83 year old male with altered mental status. Possible early left MCA territory infarct on noncontrast head CT today. EXAM: MRI HEAD WITHOUT CONTRAST MRA HEAD WITHOUT CONTRAST TECHNIQUE: Multiplanar, multiecho pulse sequences of the brain and surrounding structures were obtained without intravenous contrast. Angiographic images of the head were obtained using MRA technique without contrast. COMPARISON:  Head CT without contrast 1409 hours. FINDINGS: MRI HEAD FINDINGS The examination had to be discontinued prior to completion due to patient coughing, confusion, and agitation. Axial and coronal diffusion weighted imaging plus axial T2, FLAIR, and T2* imaging was obtained and is intermittently degraded by motion artifact. Several Subcentimeter foci of restricted diffusion in the right posterior corona radiata and right lentiform nuclei (series 4 images 27-31. No other convincing restricted diffusion identified. Mild associated T2 and FLAIR  hyperintensity with the acute diffusion findings. Superimposed chronic lacunar infarcts in the right basal ganglia and thalamus. Superimposed confluent bilateral cerebral white matter T2 and FLAIR hyperintensity with scattered areas of chronic white matter lacunar infarcts (series 6, image 19). No definite chronic cerebral blood products. No midline shift. No extra-axial collection or acute intracranial hemorrhage identified. Stable ventricle size and configuration. Patent basilar cisterns. MRA HEAD FINDINGS Study is intermittently degraded by motion artifact despite repeated imaging attempts. Antegrade flow in the distal left vertebral artery, but no antegrade flow signal in the distal right vertebral artery. Antegrade flow in the basilar without stenosis. PCA origins, P1 and P2 segments bilaterally are patent. Posterior communicating arteries are diminutive or absent. Antegrade flow in both ICA siphons. Bilateral carotid termini are patent. Both MCA and ACA origins appear patent. But insufficient bilateral MCA and ACA branch detail otherwise. IMPRESSION: 1. Prematurely discontinued and intermittently motion degraded study due to patient agitation. 2. Scattered small acute lacunar infarcts in the right corona radiata and posterior right deep gray matter nuclei. No associated hemorrhage or mass effect. 3. No strong evidence of emergent large vessel occlusion. 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 4. Underlying chronic small vessel disease in the bilateral cerebral white matter and right deep gray  matter nuclei. Electronically Signed   By: Odessa Fleming M.D.   On: 12/13/2016 14:56   Mr Brain Wo Contrast  Result Date: 12/13/2016 CLINICAL DATA:  83 year old male with altered mental status. Possible early left MCA territory infarct on noncontrast head CT today. EXAM: MRI HEAD WITHOUT CONTRAST MRA HEAD WITHOUT CONTRAST TECHNIQUE: Multiplanar, multiecho pulse sequences of the brain and surrounding  structures were obtained without intravenous contrast. Angiographic images of the head were obtained using MRA technique without contrast. COMPARISON:  Head CT without contrast 1409 hours. FINDINGS: MRI HEAD FINDINGS The examination had to be discontinued prior to completion due to patient coughing, confusion, and agitation. Axial and coronal diffusion weighted imaging plus axial T2, FLAIR, and T2* imaging was obtained and is intermittently degraded by motion artifact. Several Subcentimeter foci of restricted diffusion in the right posterior corona radiata and right lentiform nuclei (series 4 images 27-31. No other convincing restricted diffusion identified. Mild associated T2 and FLAIR hyperintensity with the acute diffusion findings. Superimposed chronic lacunar infarcts in the right basal ganglia and thalamus. Superimposed confluent bilateral cerebral white matter T2 and FLAIR hyperintensity with scattered areas of chronic white matter lacunar infarcts (series 6, image 19). No definite chronic cerebral blood products. No midline shift. No extra-axial collection or acute intracranial hemorrhage identified. Stable ventricle size and configuration. Patent basilar cisterns. MRA HEAD FINDINGS Study is intermittently degraded by motion artifact despite repeated imaging attempts. Antegrade flow in the distal left vertebral artery, but no antegrade flow signal in the distal right vertebral artery. Antegrade flow in the basilar without stenosis. PCA origins, P1 and P2 segments bilaterally are patent. Posterior communicating arteries are diminutive or absent. Antegrade flow in both ICA siphons. Bilateral carotid termini are patent. Both MCA and ACA origins appear patent. But insufficient bilateral MCA and ACA branch detail otherwise. IMPRESSION: 1. Prematurely discontinued and intermittently motion degraded study due to patient agitation. 2. Scattered small acute lacunar infarcts in the right corona radiata and posterior  right deep gray matter nuclei. No associated hemorrhage or mass effect. 3. No strong evidence of emergent large vessel occlusion. 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 4. Underlying chronic small vessel disease in the bilateral cerebral white matter and right deep gray matter nuclei. Electronically Signed   By: Odessa Fleming M.D.   On: 12/13/2016 14:56   Dg Chest Port 1 View  Result Date: 12/11/2016 CLINICAL DATA:  Acute onset of cough.  Initial encounter. EXAM: PORTABLE CHEST 1 VIEW COMPARISON:  Chest radiograph performed 12/10/2016 FINDINGS: The lungs are well-aerated and clear. There is no evidence of focal opacification, pleural effusion or pneumothorax. The cardiomediastinal silhouette is enlarged. No acute osseous abnormalities are seen. IMPRESSION: Cardiomegaly.  Lungs remain grossly clear. Electronically Signed   By: Roanna Raider M.D.   On: 12/11/2016 23:43   Dg Swallowing Func-speech Pathology  Result Date: 12/14/2016 Objective Swallowing Evaluation: Type of Study: MBS-Modified Barium Swallow Study Patient Details Name: Bryce Potter MRN: [id number] Date of Birth: 07-27-1919 Today's Date: 12/14/2016 Time: SLP Start Time (ACUTE ONLY): 1329-SLP Stop Time (ACUTE ONLY): 1343 SLP Time Calculation (min) (ACUTE ONLY): 14 min Past Medical History: Past Medical History: Diagnosis Date . Arthritis  . Dementia  . GERD (gastroesophageal reflux disease)  . Hypertension  . Hypothyroidism  . Stroke [facility]) in age 19's - 51's . UTI (urinary tract infection) 12/10/2016 Past Surgical History: Past Surgical History: Procedure Laterality Date . CATARACT EXTRACTION Bilateral  HPI: Adeyemi Hamad a 83 y.o.malewith medical history significant  forarthritis, dementia, [name], hypertension, hypothyroidism,stroke, brought to the emergency department by his daughter, due to 1 to 2 day history of increased debilitation, and acute confusion. Chest x ray grossly clear. MRI showed scattered small acute lacunar  infarcts in the right corona radiata and posterior right deep gray matter nuclei. No Data Recorded Assessment / Plan / Recommendation CHL IP CLINICAL IMPRESSIONS 12/14/2016 Clinical Impression Bryce Potter presents with a moderate oral and moderate sensorimotor pharyngeal dysphagia. Pt required assistance for feeding as well as verbal cueing and reminders throughout the MBS to "swallow". Oral phase impairments > pharyngeal impairments consisting of delayed oral transit/holding, lingual/palatal residue, and poor labial closure resulting in anterior spillage. Pt reported that he has partial dentures in his room, although they were unavailable during this study. Observed moderate vallecular residue with each tested consistency that was ineffectively cleared with cues for second swallows. Cup sips of thin liquids resulted in silent penetration to the level of the vocal cords with a weak, cued cough that did not clear penetrates. Compensatory strategies or postural changes were not attempted due to pt's hx dementia and unlikely compliance/carryover. Educated pt and daughter re: results of MBS and diet recomendations. Recommend Dys 2 solids, nectar thick liquids (no straws), meds crushed, lingual sweep for clearance of pocketing. ST will f/u briefly for continued education and for diet tolerance. SLP Visit Diagnosis Dysphagia, oropharyngeal phase (R13.12) Attention and concentration deficit following -- Frontal lobe and executive function deficit following -- Impact on safety and function Moderate aspiration risk   CHL IP TREATMENT RECOMMENDATION 12/14/2016 Treatment Recommendations Therapy as outlined in treatment plan below   Prognosis 12/14/2016 Prognosis for Safe Diet Advancement Fair Barriers to Reach Goals Cognitive deficits Barriers/Prognosis Comment -- [facility] IP DIET RECOMMENDATION 12/14/2016 SLP Diet Recommendations Dysphagia 2 (Fine chop) solids;Nectar thick liquid Liquid Administration via Cup;No straw Medication  Administration Crushed with puree Compensations Slow rate;Small sips/bites;Minimize environmental distractions;Monitor for anterior loss;Lingual sweep for clearance of pocketing Postural Changes Seated upright at 90 degrees   CHL IP OTHER RECOMMENDATIONS 12/14/2016 Recommended Consults -- Oral Care Recommendations Oral care BID Other Recommendations Order thickener from pharmacy   CHL IP FOLLOW UP RECOMMENDATIONS 12/14/2016 Follow up Recommendations Skilled Nursing facility   [facility] IP FREQUENCY AND DURATION 12/14/2016 Speech Therapy Frequency (ACUTE ONLY) min 2x/week Treatment Duration 1 week      CHL IP ORAL PHASE 12/14/2016 Oral Phase Impaired Oral - Pudding Teaspoon -- Oral - Pudding Cup -- Oral - Honey Teaspoon -- Oral - Honey Cup -- Oral - Nectar Teaspoon -- Oral - Nectar Cup Delayed oral transit;Piecemeal swallowing;Lingual/palatal residue;Left anterior bolus loss;Right anterior bolus loss;Holding of bolus Oral - Nectar Straw -- Oral - Thin Teaspoon -- Oral - Thin Cup Holding of bolus;Lingual/palatal residue;Piecemeal swallowing;Delayed oral transit;Left anterior bolus loss;Right anterior bolus loss Oral - Thin Straw -- Oral - Puree -- Oral - Mech Soft Delayed oral transit;Impaired mastication;Other (Comment) Oral - Regular -- Oral - Multi-Consistency -- Oral - Pill -- Oral Phase - Comment --  CHL IP PHARYNGEAL PHASE 12/14/2016 Pharyngeal Phase Impaired Pharyngeal- Pudding Teaspoon -- Pharyngeal -- Pharyngeal- Pudding Cup -- Pharyngeal -- Pharyngeal- Honey Teaspoon -- Pharyngeal -- Pharyngeal- Honey Cup -- Pharyngeal -- Pharyngeal- Nectar Teaspoon -- Pharyngeal -- Pharyngeal- Nectar Cup Pharyngeal residue - valleculae;Reduced epiglottic inversion;Delayed swallow initiation-vallecula;Delayed swallow initiation-pyriform sinuses Pharyngeal -- Pharyngeal- Nectar Straw -- Pharyngeal -- Pharyngeal- Thin Teaspoon -- Pharyngeal -- Pharyngeal- Thin Cup Penetration/Aspiration during swallow;Pharyngeal residue -  valleculae;Delayed swallow initiation-vallecula;Delayed swallow initiation-pyriform sinuses;Reduced epiglottic inversion Pharyngeal Material  enters airway, CONTACTS cords and not ejected out Pharyngeal- Thin Straw -- Pharyngeal -- Pharyngeal- Puree -- Pharyngeal -- Pharyngeal- Mechanical Soft WFL Pharyngeal -- Pharyngeal- Regular -- Pharyngeal -- Pharyngeal- Multi-consistency -- Pharyngeal -- Pharyngeal- Pill -- Pharyngeal -- Pharyngeal Comment --  CHL IP CERVICAL ESOPHAGEAL PHASE 12/14/2016 Cervical Esophageal Phase WFL Pudding Teaspoon -- Pudding Cup -- Honey Teaspoon -- Honey Cup -- Nectar Teaspoon -- Nectar Cup -- Nectar Straw -- Thin Teaspoon -- Thin Cup -- Thin Straw -- Puree -- Mechanical Soft -- Regular -- Multi-consistency -- Pill -- Cervical Esophageal Comment -- No flowsheet data found. Royce Macadamia 12/14/2016, 2:59 PM Breck Coons Lonell Face.Ed CCC-SLP Pager [phone]              Ct Head Code Stroke Wo Contrast`  Result Date: 12/12/2016 CLINICAL DATA:  Code stroke. Altered mental status. Right-sided facial droop and drooling. The patient is leaning to the right. EXAM: CT HEAD WITHOUT CONTRAST TECHNIQUE: Contiguous axial images were obtained from the base of the skull through the vertex without intravenous contrast. COMPARISON:  CT head without contrast 12/10/2016 and 08/25/2013. FINDINGS: Brain: Passed atrophy diffuse white matter disease is again seen. There may be subtle loss of gray-white differentiation along the left insular cortex compared to the prior studies. The basal ganglia are intact. Remote lacunar infarcts of the basal ganglia are stable. No acute hemorrhage is present. The ventricles are proportionate to the degree of atrophy and stable. No significant extra-axial fluid collection is present. Vascular: The left M1 segment is slightly hyperdense compared to the other vessels. This could represent thrombus. Atherosclerotic calcifications are again noted within the cavernous segments  bilaterally. Skull: Calvarium is intact. No focal lytic or blastic lesions are present. Sinuses/Orbits: The paranasal sinuses and mastoid air cells are clear. Bilateral lens replacements are present. The globes and orbits are otherwise intact. ASPECTS [facility] Stroke Program Early CT Score) - Ganglionic level infarction (caudate, lentiform nuclei, internal capsule, insula, M1-M3 cortex): 6/7 - Supraganglionic infarction (M4-M6 cortex): 3/3 Total score (0-10 with 10 being normal): 9/10 IMPRESSION: 1. Possible developing nonhemorrhagic left MCA territory infarct with subtle change in gray-white differentiation along the insular cortex and asymmetric hyperdense left M1 segments suggesting possible large vessel occlusion. CTA of the head and neck would be useful. CT perfusion could be utilized for evaluation of cerebral ischemia if indicated. 2. ASPECTS is 9/10 3. Advanced atrophy and diffuse white matter disease is otherwise stable. 4. Remote lacunar infarcts are noted within the basal ganglia bilaterally. These results were called by telephone at the time of interpretation on 12/12/2016 at 2:31 pm to Dr. Nicholas Lose, who verbally acknowledged these results. Electronically Signed   By: Marin Roberts M.D.   On: 12/12/2016 14:32     Subjective: Pt doing much better, he is eating and drinking well. Safe for discharge to SNF today  Discharge Vitals: Vitals:   12/15/16 0922 12/15/16 1023  BP: (!) 148/72   Pulse: 75 74  Resp:    Temp:  98.9 F (37.2 C)   Vitals:   12/15/16 0634 12/15/16 0911 12/15/16 0922 12/15/16 1023  BP: 120/85 (!) 133/54 (!) 148/72   Pulse: 77 70 75 74  Resp: 19 18    Temp: 98.7 F (37.1 C) 98.7 F (37.1 C)  98.9 F (37.2 C)  TempSrc: Oral Oral  Oral  SpO2: 99% 99%  97%  Weight:      Height:       The results of significant diagnostics from this  hospitalization (including imaging, microbiology, ancillary and laboratory) are listed below for reference.      Microbiology: Recent Results (from the past 240 hour(s))  Urine culture     Status: Abnormal   Collection Time: 12/10/16 10:50 AM  Result Value Ref Range Status   Specimen Description URINE, CATHETERIZED  Final   Special Requests NONE  Final   Culture >=100,000 COLONIES/mL ESCHERICHIA COLI (A)  Final   Report Status 12/12/2016 FINAL  Final   Organism ID, Bacteria ESCHERICHIA COLI (A)  Final      Susceptibility   Escherichia coli - MIC*    AMPICILLIN <=2 SENSITIVE Sensitive     CEFAZOLIN <=4 SENSITIVE Sensitive     CEFTRIAXONE <=1 SENSITIVE Sensitive     CIPROFLOXACIN >=4 RESISTANT Resistant     GENTAMICIN <=1 SENSITIVE Sensitive     IMIPENEM <=0.25 SENSITIVE Sensitive     NITROFURANTOIN <=16 SENSITIVE Sensitive     TRIMETH/SULFA <=20 SENSITIVE Sensitive     AMPICILLIN/SULBACTAM <=2 SENSITIVE Sensitive     PIP/TAZO <=4 SENSITIVE Sensitive     Extended ESBL NEGATIVE Sensitive     * >=100,000 COLONIES/mL ESCHERICHIA COLI  Culture, blood (Routine x 2)     Status: Abnormal   Collection Time: 12/10/16 11:06 AM  Result Value Ref Range Status   Specimen Description BLOOD RIGHT ANTECUBITAL  Final   Special Requests   Final    BOTTLES DRAWN AEROBIC AND ANAEROBIC Blood Culture adequate volume   Culture  Setup Time   Final    GRAM NEGATIVE RODS ANAEROBIC BOTTLE ONLY CRITICAL RESULT CALLED TO, READ BACK BY AND VERIFIED WITH: JAMES LEDFORD, PHARMD  12/11/16 MKELLY,MLT    Culture ESCHERICHIA COLI (A)  Final   Report Status 12/13/2016 FINAL  Final   Organism ID, Bacteria ESCHERICHIA COLI  Final      Susceptibility   Escherichia coli - MIC*    AMPICILLIN <=2 SENSITIVE Sensitive     CEFAZOLIN <=4 SENSITIVE Sensitive     CEFEPIME <=1 SENSITIVE Sensitive     CEFTAZIDIME <=1 SENSITIVE Sensitive     CEFTRIAXONE <=1 SENSITIVE Sensitive     CIPROFLOXACIN >=4 RESISTANT Resistant     GENTAMICIN <=1 SENSITIVE Sensitive     IMIPENEM <=0.25 SENSITIVE Sensitive     TRIMETH/SULFA <=20  SENSITIVE Sensitive     AMPICILLIN/SULBACTAM <=2 SENSITIVE Sensitive     PIP/TAZO <=4 SENSITIVE Sensitive     Extended ESBL NEGATIVE Sensitive     * ESCHERICHIA COLI  Blood Culture ID Panel (Reflexed)     Status: Abnormal   Collection Time: 12/10/16 11:06 AM  Result Value Ref Range Status   Enterococcus species NOT DETECTED NOT DETECTED Final   Listeria monocytogenes NOT DETECTED NOT DETECTED Final   Staphylococcus species NOT DETECTED NOT DETECTED Final   Staphylococcus aureus NOT DETECTED NOT DETECTED Final   Streptococcus species NOT DETECTED NOT DETECTED Final   Streptococcus agalactiae NOT DETECTED NOT DETECTED Final   Streptococcus pneumoniae NOT DETECTED NOT DETECTED Final   Streptococcus pyogenes NOT DETECTED NOT DETECTED Final   Acinetobacter baumannii NOT DETECTED NOT DETECTED Final   Enterobacteriaceae species DETECTED (A) NOT DETECTED Final    Comment: Enterobacteriaceae represent a large family of gram-negative bacteria, not a single organism. CRITICAL RESULT CALLED TO, READ BACK BY AND VERIFIED WITH: JAMES LEDFORD, PHARMD  12/11/16 MKELLY,MLT    Enterobacter cloacae complex NOT DETECTED NOT DETECTED Final   Escherichia coli DETECTED (A) NOT DETECTED Final    Comment: CRITICAL RESULT CALLED  TO, READ BACK BY AND VERIFIED WITH: JAMES LEDFORD, PHARMD  12/11/16 MKELLY,MLT    Klebsiella oxytoca NOT DETECTED NOT DETECTED Final   Klebsiella pneumoniae NOT DETECTED NOT DETECTED Final   Proteus species NOT DETECTED NOT DETECTED Final   Serratia marcescens NOT DETECTED NOT DETECTED Final   Carbapenem resistance NOT DETECTED NOT DETECTED Final   Haemophilus influenzae NOT DETECTED NOT DETECTED Final   Neisseria meningitidis NOT DETECTED NOT DETECTED Final   Pseudomonas aeruginosa NOT DETECTED NOT DETECTED Final   Candida albicans NOT DETECTED NOT DETECTED Final   Candida glabrata NOT DETECTED NOT DETECTED Final   Candida krusei NOT DETECTED NOT DETECTED Final    Candida parapsilosis NOT DETECTED NOT DETECTED Final   Candida tropicalis NOT DETECTED NOT DETECTED Final  Culture, blood (Routine x 2)     Status: None   Collection Time: 12/10/16 11:16 AM  Result Value Ref Range Status   Specimen Description BLOOD LEFT ANTECUBITAL  Final   Special Requests IN PEDIATRIC BOTTLE Blood Culture adequate volume  Final   Culture NO GROWTH 5 DAYS  Final   Report Status 12/15/2016 FINAL  Final     Labs: BNP (last 3 results) No results for input(s): BNP in the last 8760 hours. Basic Metabolic Panel:  Recent Labs Lab 12/10/16 1103 12/11/16 0446 12/12/16 0923 12/14/16 0509  NA 141 142 137 140  K 4.4 4.4 3.8 3.9  CL 109 111 105 108  CO2 21*  GLUCOSE 122* 127* 112* 86  BUN 21* 21* 19 20  CREATININE 1.73* 1.75* 1.49* 1.51*  CALCIUM 9.0 8.3* 8.5* 8.8*   Liver Function Tests:  Recent Labs Lab 12/10/16 1103 12/11/16 0446 12/12/16 0923  AST ALT ALKPHOS 57 55 50  BILITOT 0.4 0.4 0.7  PROT 7.6 6.6 6.9  ALBUMIN 3.2* 2.7* 2.9*   No results for input(s): LIPASE, AMYLASE in the last 168 hours.  Recent Labs Lab 12/10/16 1421  AMMONIA 17   CBC:  Recent Labs Lab 12/10/16 1103 12/11/16 0446 12/12/16 0923 12/14/16 0509  WBC 7.3 15.9* 8.5 6.7  NEUTROABS 4.2  --   --   --   HGB 11.8* 10.4* 10.4* 11.1*  HCT 36.6* 32.9* 32.3* 34.1*  MCV 91.7 92.4 90.7 90.2  PLT 234 215 223 234   Cardiac Enzymes: No results for input(s): CKTOTAL, CKMB, CKMBINDEX, TROPONINI in the last 168 hours. BNP: Invalid input(s): POCBNP CBG: No results for input(s): GLUCAP in the last 168 hours. D-Dimer No results for input(s): DDIMER in the last 72 hours. Hgb A1c  Recent Labs  12/13/16 1150  HGBA1C 6.6*   Lipid Profile  Recent Labs  12/14/16 0509  CHOL 176  HDL 27*  LDLCALC 118*  TRIG 153*  CHOLHDL 6.5   Thyroid function studies No results for input(s): TSH, T4TOTAL, T3FREE, THYROIDAB in the last 72 hours.  Invalid  input(s): FREET3 Anemia work up No results for input(s): VITAMINB12, FOLATE, FERRITIN, TIBC, IRON, RETICCTPCT in the last 72 hours. Urinalysis    Component Value Date/Time   COLORURINE YELLOW 12/10/2016 1050   APPEARANCEUR CLOUDY (A) 12/10/2016 1050   LABSPEC 1.015 12/10/2016 1050   PHURINE 5.5 12/10/2016 1050   GLUCOSEU NEGATIVE 12/10/2016 1050   HGBUR LARGE (A) 12/10/2016 1050   BILIRUBINUR NEGATIVE 12/10/2016 1050   KETONESUR NEGATIVE 12/10/2016 1050   PROTEINUR NEGATIVE 12/10/2016 1050   UROBILINOGEN 1.0 08/25/2013 1427   NITRITE NEGATIVE 12/10/2016 1050   LEUKOCYTESUR  LARGE (A) 12/10/2016 1050   Sepsis Labs Invalid input(s): PROCALCITONIN,  WBC,  LACTICIDVEN Microbiology Recent Results (from the past 240 hour(s))  Urine culture     Status: Abnormal   Collection Time: 12/10/16 10:50 AM  Result Value Ref Range Status   Specimen Description URINE, CATHETERIZED  Final   Special Requests NONE  Final   Culture >=100,000 COLONIES/mL ESCHERICHIA COLI (A)  Final   Report Status 12/12/2016 FINAL  Final   Organism ID, Bacteria ESCHERICHIA COLI (A)  Final      Susceptibility   Escherichia coli - MIC*    AMPICILLIN <=2 SENSITIVE Sensitive     CEFAZOLIN <=4 SENSITIVE Sensitive     CEFTRIAXONE <=1 SENSITIVE Sensitive     CIPROFLOXACIN >=4 RESISTANT Resistant     GENTAMICIN <=1 SENSITIVE Sensitive     IMIPENEM <=0.25 SENSITIVE Sensitive     NITROFURANTOIN <=16 SENSITIVE Sensitive     TRIMETH/SULFA <=20 SENSITIVE Sensitive     AMPICILLIN/SULBACTAM <=2 SENSITIVE Sensitive     PIP/TAZO <=4 SENSITIVE Sensitive     Extended ESBL NEGATIVE Sensitive     * >=100,000 COLONIES/mL ESCHERICHIA COLI  Culture, blood (Routine x 2)     Status: Abnormal   Collection Time: 12/10/16 11:06 AM  Result Value Ref Range Status   Specimen Description BLOOD RIGHT ANTECUBITAL  Final   Special Requests   Final    BOTTLES DRAWN AEROBIC AND ANAEROBIC Blood Culture adequate volume   Culture  Setup Time    Final    GRAM NEGATIVE RODS ANAEROBIC BOTTLE ONLY CRITICAL RESULT CALLED TO, READ BACK BY AND VERIFIED WITH: JAMES LEDFORD, PHARMD  12/11/16 MKELLY,MLT    Culture ESCHERICHIA COLI (A)  Final   Report Status 12/13/2016 FINAL  Final   Organism ID, Bacteria ESCHERICHIA COLI  Final      Susceptibility   Escherichia coli - MIC*    AMPICILLIN <=2 SENSITIVE Sensitive     CEFAZOLIN <=4 SENSITIVE Sensitive     CEFEPIME <=1 SENSITIVE Sensitive     CEFTAZIDIME <=1 SENSITIVE Sensitive     CEFTRIAXONE <=1 SENSITIVE Sensitive     CIPROFLOXACIN >=4 RESISTANT Resistant     GENTAMICIN <=1 SENSITIVE Sensitive     IMIPENEM <=0.25 SENSITIVE Sensitive     TRIMETH/SULFA <=20 SENSITIVE Sensitive     AMPICILLIN/SULBACTAM <=2 SENSITIVE Sensitive     PIP/TAZO <=4 SENSITIVE Sensitive     Extended ESBL NEGATIVE Sensitive     * ESCHERICHIA COLI  Blood Culture ID Panel (Reflexed)     Status: Abnormal   Collection Time: 12/10/16 11:06 AM  Result Value Ref Range Status   Enterococcus species NOT DETECTED NOT DETECTED Final   Listeria monocytogenes NOT DETECTED NOT DETECTED Final   Staphylococcus species NOT DETECTED NOT DETECTED Final   Staphylococcus aureus NOT DETECTED NOT DETECTED Final   Streptococcus species NOT DETECTED NOT DETECTED Final   Streptococcus agalactiae NOT DETECTED NOT DETECTED Final   Streptococcus pneumoniae NOT DETECTED NOT DETECTED Final   Streptococcus pyogenes NOT DETECTED NOT DETECTED Final   Acinetobacter baumannii NOT DETECTED NOT DETECTED Final   Enterobacteriaceae species DETECTED (A) NOT DETECTED Final    Comment: Enterobacteriaceae represent a large family of gram-negative bacteria, not a single organism. CRITICAL RESULT CALLED TO, READ BACK BY AND VERIFIED WITH: JAMES LEDFORD, PHARMD  12/11/16 MKELLY,MLT    Enterobacter cloacae complex NOT DETECTED NOT DETECTED Final   Escherichia coli DETECTED (A) NOT DETECTED Final    Comment: CRITICAL RESULT CALLED TO, READ  BACK BY AND VERIFIED WITH: Fayrene Fearing LEDFORD, PHARMD @[id number]  12/11/16 MKELLY,MLT    Klebsiella oxytoca NOT DETECTED NOT DETECTED Final   Klebsiella pneumoniae NOT DETECTED NOT DETECTED Final   Proteus species NOT DETECTED NOT DETECTED Final   Serratia marcescens NOT DETECTED NOT DETECTED Final   Carbapenem resistance NOT DETECTED NOT DETECTED Final   Haemophilus influenzae NOT DETECTED NOT DETECTED Final   Neisseria meningitidis NOT DETECTED NOT DETECTED Final   Pseudomonas aeruginosa NOT DETECTED NOT DETECTED Final   Candida albicans NOT DETECTED NOT DETECTED Final   Candida glabrata NOT DETECTED NOT DETECTED Final   Candida krusei NOT DETECTED NOT DETECTED Final   Candida parapsilosis NOT DETECTED NOT DETECTED Final   Candida tropicalis NOT DETECTED NOT DETECTED Final  Culture, blood (Routine x 2)     Status: None   Collection Time: 12/10/16 11:16 AM  Result Value Ref Range Status   Specimen Description BLOOD LEFT ANTECUBITAL  Final   Special Requests IN PEDIATRIC BOTTLE Blood Culture adequate volume  Final   Culture NO GROWTH 5 DAYS  Final   Report Status 12/15/2016 FINAL  Final   Time coordinating discharge: 35 minutes  SIGNED:   Standley Dakins, MD  [facility] 12/15/2016, 1:26 PM Pager [phone]  If 7PM-7AM, please contact night-coverage [email or web address] Password [email or web address]

## 2016-12-15 NOTE — Clinical Social Work Placement (Signed)
   CLINICAL SOCIAL WORK PLACEMENT  NOTE  Date:  12/15/2016  Patient Details  Name: Bryce Potter MRN: [id number] Date of Birth: [birth year]  Clinical Social Work is seeking post-discharge placement for this patient at the Skilled  Nursing Facility level of care (*CSW will initial, date and re-position this form in  chart as items are completed):  Yes   Patient/family provided with [facility] Clinical Social Work Department's list of facilities offering this level of care within the geographic area requested by the patient (or if unable, by the patient's family).  Yes   Patient/family informed of their freedom to choose among providers that offer the needed level of care, that participate in Medicare, Medicaid or managed care program needed by the patient, have an available bed and are willing to accept the patient.  Yes   Patient/family informed of [facility]'s ownership interest in [facility] and [facility], as well as of the fact that they are under no obligation to receive care at these facilities.  PASRR submitted to EDS on       PASRR number received on       Existing PASRR number confirmed on 12/15/16     FL2 transmitted to all facilities in geographic area requested by pt/family on 12/15/16     FL2 transmitted to all facilities within larger geographic area on       Patient informed that his/her managed care company has contracts with or will negotiate with certain facilities, including the following:  [employer]     Yes   Patient/family informed of bed offers received.  Patient chooses bed at [facility]     Physician recommends and patient chooses bed at      Patient to be transferred to [facility] on 12/15/16.  Patient to be transferred to facility by [facility]     Patient family notified on 12/15/16 of transfer.  Name of family member notified:  Zak Gondek (Daughter)     PHYSICIAN       Additional Comment:     _______________________________________________ Renard Hamper, Student-Social Work 12/15/2016, 4:16 PM

## 2016-12-16 ENCOUNTER — Non-Acute Institutional Stay (SKILLED_NURSING_FACILITY): Payer: Medicare Other | Admitting: Internal Medicine

## 2016-12-16 ENCOUNTER — Encounter: Payer: Self-pay | Admitting: Internal Medicine

## 2016-12-16 DIAGNOSIS — I63512 Cerebral infarction due to unspecified occlusion or stenosis of left middle cerebral artery: Secondary | ICD-10-CM

## 2016-12-16 DIAGNOSIS — R7881 Bacteremia: Secondary | ICD-10-CM

## 2016-12-16 DIAGNOSIS — K219 Gastro-esophageal reflux disease without esophagitis: Secondary | ICD-10-CM

## 2016-12-16 DIAGNOSIS — R531 Weakness: Secondary | ICD-10-CM

## 2016-12-16 DIAGNOSIS — R1310 Dysphagia, unspecified: Secondary | ICD-10-CM | POA: Diagnosis not present

## 2016-12-16 DIAGNOSIS — E039 Hypothyroidism, unspecified: Secondary | ICD-10-CM | POA: Diagnosis not present

## 2016-12-16 DIAGNOSIS — B9620 Unspecified Escherichia coli [E. coli] as the cause of diseases classified elsewhere: Secondary | ICD-10-CM

## 2016-12-16 DIAGNOSIS — I10 Essential (primary) hypertension: Secondary | ICD-10-CM

## 2016-12-16 DIAGNOSIS — E785 Hyperlipidemia, unspecified: Secondary | ICD-10-CM

## 2016-12-16 DIAGNOSIS — N183 Chronic kidney disease, stage 3 unspecified: Secondary | ICD-10-CM

## 2016-12-16 DIAGNOSIS — N390 Urinary tract infection, site not specified: Secondary | ICD-10-CM

## 2016-12-16 NOTE — Progress Notes (Signed)
LOCATION: [facility]  PCP: Boneta Lucks, NP   Code Status: Full Code  Goals of care: Advanced Directive information Advanced Directives 12/10/2016  Does Patient Have a Medical Advance Directive? No  Would patient like information on creating a medical advance directive? No - Patient declined       Extended Emergency Contact Information Primary Emergency Contact: Evern Bio [place] Mobile Phone: [phone] Relation: Daughter Secondary Emergency Contact: Jae Dire Address: [address]          [place],  [address] Home Phone: [phone] Relation: None   No Known Allergies  Chief Complaint  Patient presents with  . New Admit To SNF    New Admission Visit      HPI:  Patient is a 83 y.o. male seen today for short term rehabilitation post hospital admission from 12/10/2016-12/15/2016 with acute encephalopathy. He was diagnosed with Escherichia coli bacteremia and urinary tract infection with blood and urine culture growing Escherichia coli. He was placed on IV antibiotic. In the hospital, he was found to have right facial droop and right-sided weakness. Code stroke was initiated and he was seen by neurology. He is now on Plavix and atorvastatin. He had dysphagia and was seen by speech therapy and placed on dysphagia diet. He has medical history of dementia, gastroesophageal reflux disease, hypertension, ckd stage 3, hypothyroidism, stroke among others. He is seen in his room today with her starting at bedside.   Review of Systems:  Constitutional: Negative for fever, chills. HENT: Negative for headache, congestion, nasal discharge. Positive for sore throat.  Eyes: Negative for blurred vision, double vision and discharge.  Respiratory: Negative for shortness of breath and wheezing. Positive for cough with clear phlegm for 2 days Cardiovascular: Negative for chest pain, palpitations, leg swelling.  Gastrointestinal: Negative for heartburn,  nausea, vomiting,loss of appetite and abdominal pain. Last bowel movement was 2 days ago.  Genitourinary: Negative for dysuria.  Musculoskeletal: Negative for back pain, fall.  Skin: Negative for itching, rash.  Neurological: Negative for dizziness. Psychiatric/Behavioral: Negative for depression.   Past Medical History:  Diagnosis Date  . Arthritis   . Dementia   . GERD (gastroesophageal reflux disease)   . Hypertension   . Hypothyroidism   . Stroke [facility]) in age 70's - 26's  . UTI (urinary tract infection) 12/10/2016   Past Surgical History:  Procedure Laterality Date  . CATARACT EXTRACTION Bilateral    Social History:   reports that he has never smoked. He has never used smokeless tobacco. He reports that he does not drink alcohol or use drugs.  No family history on file.  Medications: Allergies as of 12/16/2016   No Known Allergies     Medication List       Accurate as of 12/16/16 10:49 AM. Always use your most recent med list.          amoxicillin-clavulanate 500-125 MG tablet Commonly known as:  AUGMENTIN Take 1 tablet (500 mg total) by mouth 2 (two) times daily with a meal.   atorvastatin 20 MG tablet Commonly known as:  LIPITOR Take 1 tablet (20 mg total) by mouth daily at 6 PM.   clopidogrel 75 MG tablet Commonly known as:  PLAVIX Take 1 tablet (75 mg total) by mouth daily.   dextromethorphan-guaiFENesin 30-600 MG 12hr tablet Commonly known as:  MUCINEX DM Take 1 tablet by mouth 2 (two) times daily.   levothyroxine 75 MCG tablet Commonly known as:  SYNTHROID, LEVOTHROID Take 75 mcg  by mouth daily before breakfast.   metoprolol tartrate 25 MG tablet Commonly known as:  LOPRESSOR Take 25 mg by mouth 2 (two) times daily.   omega-3 acid ethyl esters 1 g capsule Commonly known as:  LOVAZA Take 1 g by mouth daily.   omeprazole 40 MG capsule Commonly known as:  PRILOSEC Take 40 mg by mouth daily.   RESOURCE THICKENUP CLEAR Powd Take 120 g by  mouth as needed.   tamsulosin 0.4 MG Caps capsule Commonly known as:  FLOMAX Take 0.4 mg by mouth daily after supper.   UNABLE TO FIND Med Name: Med pass 120 mL by mouth daily       Immunizations: There is no immunization history for the selected administration types on file for this patient.   Physical Exam: Vitals:   12/16/16 1044  BP: 119/60  Pulse: 85  Resp: 18  Temp: 98.2 F (36.8 C)  TempSrc: Oral  SpO2: 96%  Weight: 189 lb (85.7 kg)  Height:  (1.854 m)   Body mass index is 24.94 kg/m.  General- elderly male, well built, in no acute distress Head- normocephalic, atraumatic Nose- no maxillary or frontal sinus tenderness, no nasal discharge Throat- moist mucus membrane, drooling present, edentulous  Eyes- PERRLA, EOMI, no pallor, no icterus, no discharge, normal conjunctiva, normal sclera Neck- no cervical lymphadenopathy Cardiovascular- normal s1,s2, no murmur Respiratory- bilateral clear to auscultation, no wheeze, no rhonchi, no crackles, no use of accessory muscles Abdomen- bowel sounds present, soft, non tender, no guarding or rigidity Musculoskeletal- generalized weakness right > left, trace leg edema Neurological- alert and oriented to person and place only, mild right facial droop Skin- warm and dry Psychiatry- normal mood and affect    Labs reviewed: Basic Metabolic Panel:  Recent Labs  13/08/65 0446 12/12/16 0923 12/14/16 0509  NA 142 137 140  K 4.4 3.8 3.9  CL 111 105 108  CO2 23 24 21*  GLUCOSE 127* 112* 86  BUN 21* 19 20  CREATININE 1.75* 1.49* 1.51*  CALCIUM 8.3* 8.5* 8.8*   Liver Function Tests:  Recent Labs  12/10/16 1103 12/11/16 0446 12/12/16 0923  AST ALT ALKPHOS 57 55 50  BILITOT 0.4 0.4 0.7  PROT 7.6 6.6 6.9  ALBUMIN 3.2* 2.7* 2.9*   No results for input(s): LIPASE, AMYLASE in the last 8760 hours.  Recent Labs  12/10/16 1421  AMMONIA 17   CBC:  Recent Labs  12/10/16 1103  12/11/16 0446 12/12/16 0923 12/14/16 0509  WBC 7.3 15.9* 8.5 6.7  NEUTROABS 4.2  --   --   --   HGB 11.8* 10.4* 10.4* 11.1*  HCT 36.6* 32.9* 32.3* 34.1*  MCV 91.7 92.4 90.7 90.2  PLT 234 215 223 234   Cardiac Enzymes: No results for input(s): CKTOTAL, CKMB, CKMBINDEX, TROPONINI in the last 8760 hours. BNP: Invalid input(s): POCBNP CBG: No results for input(s): GLUCAP in the last 8760 hours.  Radiological Exams: Dg Chest 2 View  Result Date: 12/10/2016 CLINICAL DATA:  Altered mental status, history of stroke EXAM: CHEST  2 VIEW COMPARISON:  07/06/2014 FINDINGS: Borderline cardiomegaly noted. No pulmonary edema. There is left basilar atelectasis or infiltrate. Degenerative changes thoracic spine again noted. IMPRESSION: No pulmonary edema. Left basilar atelectasis on infiltrate. Degenerative changes thoracic spine. Electronically Signed   By: Natasha Mead M.D.   On: 12/10/2016 12:05   Ct Head Wo Contrast  Result Date: 12/10/2016 CLINICAL DATA:  Altered mental status,  increasing lethargy and weakness EXAM: CT HEAD WITHOUT CONTRAST TECHNIQUE: Contiguous axial images were obtained from the base of the skull through the vertex without intravenous contrast. COMPARISON:  CT brain scan of 08/25/2013 FINDINGS: Brain: Ventriculomegaly is unchanged as is diffuse cortical atrophy. The septum is midline in position. Moderately severe small vessel ischemic change throughout the periventricular white matter is unchanged as well. No hemorrhage, mass lesion, or acute infarction is seen. Vascular: No vascular abnormality is seen on this unenhanced study. Skull: No calvarial abnormality is seen. Sinuses/Orbits: There is mild mucosal thickening within the maxillary sinuses but no present sinusitis is seen. Other: None. IMPRESSION: 1. Stable atrophy and significant small vessel ischemic change throughout the periventricular white matter. No acute intracranial abnormality. 2. Mild mucosal thickening in the maxillary  sinuses. Electronically Signed   By: Dwyane Dee M.D.   On: 12/10/2016 11:53   Mr Maxine Glenn Head Wo Contrast  Result Date: 12/13/2016 CLINICAL DATA:  83 year old male with altered mental status. Possible early left MCA territory infarct on noncontrast head CT today. EXAM: MRI HEAD WITHOUT CONTRAST MRA HEAD WITHOUT CONTRAST TECHNIQUE: Multiplanar, multiecho pulse sequences of the brain and surrounding structures were obtained without intravenous contrast. Angiographic images of the head were obtained using MRA technique without contrast. COMPARISON:  Head CT without contrast 1409 hours. FINDINGS: MRI HEAD FINDINGS The examination had to be discontinued prior to completion due to patient coughing, confusion, and agitation. Axial and coronal diffusion weighted imaging plus axial T2, FLAIR, and T2* imaging was obtained and is intermittently degraded by motion artifact. Several Subcentimeter foci of restricted diffusion in the right posterior corona radiata and right lentiform nuclei (series 4 images 27-31. No other convincing restricted diffusion identified. Mild associated T2 and FLAIR hyperintensity with the acute diffusion findings. Superimposed chronic lacunar infarcts in the right basal ganglia and thalamus. Superimposed confluent bilateral cerebral white matter T2 and FLAIR hyperintensity with scattered areas of chronic white matter lacunar infarcts (series 6, image 19). No definite chronic cerebral blood products. No midline shift. No extra-axial collection or acute intracranial hemorrhage identified. Stable ventricle size and configuration. Patent basilar cisterns. MRA HEAD FINDINGS Study is intermittently degraded by motion artifact despite repeated imaging attempts. Antegrade flow in the distal left vertebral artery, but no antegrade flow signal in the distal right vertebral artery. Antegrade flow in the basilar without stenosis. PCA origins, P1 and P2 segments bilaterally are patent. Posterior communicating  arteries are diminutive or absent. Antegrade flow in both ICA siphons. Bilateral carotid termini are patent. Both MCA and ACA origins appear patent. But insufficient bilateral MCA and ACA branch detail otherwise. IMPRESSION: 1. Prematurely discontinued and intermittently motion degraded study due to patient agitation. 2. Scattered small acute lacunar infarcts in the right corona radiata and posterior right deep gray matter nuclei. No associated hemorrhage or mass effect. 3. No strong evidence of emergent large vessel occlusion. 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 4. Underlying chronic small vessel disease in the bilateral cerebral white matter and right deep gray matter nuclei. Electronically Signed   By: Odessa Fleming M.D.   On: 12/13/2016 14:56   Mr Brain Wo Contrast  Result Date: 12/13/2016 CLINICAL DATA:  83 year old male with altered mental status. Possible early left MCA territory infarct on noncontrast head CT today. EXAM: MRI HEAD WITHOUT CONTRAST MRA HEAD WITHOUT CONTRAST TECHNIQUE: Multiplanar, multiecho pulse sequences of the brain and surrounding structures were obtained without intravenous contrast. Angiographic images of the head were obtained using MRA technique  without contrast. COMPARISON:  Head CT without contrast 1409 hours. FINDINGS: MRI HEAD FINDINGS The examination had to be discontinued prior to completion due to patient coughing, confusion, and agitation. Axial and coronal diffusion weighted imaging plus axial T2, FLAIR, and T2* imaging was obtained and is intermittently degraded by motion artifact. Several Subcentimeter foci of restricted diffusion in the right posterior corona radiata and right lentiform nuclei (series 4 images 27-31. No other convincing restricted diffusion identified. Mild associated T2 and FLAIR hyperintensity with the acute diffusion findings. Superimposed chronic lacunar infarcts in the right basal ganglia and thalamus. Superimposed confluent  bilateral cerebral white matter T2 and FLAIR hyperintensity with scattered areas of chronic white matter lacunar infarcts (series 6, image 19). No definite chronic cerebral blood products. No midline shift. No extra-axial collection or acute intracranial hemorrhage identified. Stable ventricle size and configuration. Patent basilar cisterns. MRA HEAD FINDINGS Study is intermittently degraded by motion artifact despite repeated imaging attempts. Antegrade flow in the distal left vertebral artery, but no antegrade flow signal in the distal right vertebral artery. Antegrade flow in the basilar without stenosis. PCA origins, P1 and P2 segments bilaterally are patent. Posterior communicating arteries are diminutive or absent. Antegrade flow in both ICA siphons. Bilateral carotid termini are patent. Both MCA and ACA origins appear patent. But insufficient bilateral MCA and ACA branch detail otherwise. IMPRESSION: 1. Prematurely discontinued and intermittently motion degraded study due to patient agitation. 2. Scattered small acute lacunar infarcts in the right corona radiata and posterior right deep gray matter nuclei. No associated hemorrhage or mass effect. 3. No strong evidence of emergent large vessel occlusion. 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 4. Underlying chronic small vessel disease in the bilateral cerebral white matter and right deep gray matter nuclei. Electronically Signed   By: Odessa Fleming M.D.   On: 12/13/2016 14:56   Dg Chest Port 1 View  Result Date: 12/11/2016 CLINICAL DATA:  Acute onset of cough.  Initial encounter. EXAM: PORTABLE CHEST 1 VIEW COMPARISON:  Chest radiograph performed 12/10/2016 FINDINGS: The lungs are well-aerated and clear. There is no evidence of focal opacification, pleural effusion or pneumothorax. The cardiomediastinal silhouette is enlarged. No acute osseous abnormalities are seen. IMPRESSION: Cardiomegaly.  Lungs remain grossly clear. Electronically  Signed   By: Roanna Raider M.D.   On: 12/11/2016 23:43   Dg Swallowing Func-speech Pathology  Result Date: 12/14/2016 Objective Swallowing Evaluation: Type of Study: MBS-Modified Barium Swallow Study Patient Details Name: Harden Bramer MRN: [id number] Date of Birth: 1919/01/07 Today's Date: 12/14/2016 Time: SLP Start Time (ACUTE ONLY): 1329-SLP Stop Time (ACUTE ONLY): 1343 SLP Time Calculation (min) (ACUTE ONLY): 14 min Past Medical History: Past Medical History: Diagnosis Date . Arthritis  . Dementia  . GERD (gastroesophageal reflux disease)  . Hypertension  . Hypothyroidism  . Stroke [facility]) in age 60's - 35's . UTI (urinary tract infection) 12/10/2016 Past Surgical History: Past Surgical History: Procedure Laterality Date . CATARACT EXTRACTION Bilateral  HPI: Roman Dubuc a 83 y.o.malewith medical history significant forarthritis, dementia, [name], hypertension, hypothyroidism,stroke, brought to the emergency department by his daughter, due to 1 to 2 day history of increased debilitation, and acute confusion. Chest x ray grossly clear. MRI showed scattered small acute lacunar infarcts in the right corona radiata and posterior right deep gray matter nuclei. No Data Recorded Assessment / Plan / Recommendation CHL IP CLINICAL IMPRESSIONS 12/14/2016 Clinical Impression Mr. Hilleary presents with a moderate oral and moderate sensorimotor pharyngeal dysphagia. Pt required assistance for  feeding as well as verbal cueing and reminders throughout the MBS to "swallow". Oral phase impairments > pharyngeal impairments consisting of delayed oral transit/holding, lingual/palatal residue, and poor labial closure resulting in anterior spillage. Pt reported that he has partial dentures in his room, although they were unavailable during this study. Observed moderate vallecular residue with each tested consistency that was ineffectively cleared with cues for second swallows. Cup sips of thin liquids resulted in silent penetration  to the level of the vocal cords with a weak, cued cough that did not clear penetrates. Compensatory strategies or postural changes were not attempted due to pt's hx dementia and unlikely compliance/carryover. Educated pt and daughter re: results of MBS and diet recomendations. Recommend Dys 2 solids, nectar thick liquids (no straws), meds crushed, lingual sweep for clearance of pocketing. ST will f/u briefly for continued education and for diet tolerance. SLP Visit Diagnosis Dysphagia, oropharyngeal phase (R13.12) Attention and concentration deficit following -- Frontal lobe and executive function deficit following -- Impact on safety and function Moderate aspiration risk   CHL IP TREATMENT RECOMMENDATION 12/14/2016 Treatment Recommendations Therapy as outlined in treatment plan below   Prognosis 12/14/2016 Prognosis for Safe Diet Advancement Fair Barriers to Reach Goals Cognitive deficits Barriers/Prognosis Comment -- CHL IP DIET RECOMMENDATION 12/14/2016 SLP Diet Recommendations Dysphagia 2 (Fine chop) solids;Nectar thick liquid Liquid Administration via Cup;No straw Medication Administration Crushed with puree Compensations Slow rate;Small sips/bites;Minimize environmental distractions;Monitor for anterior loss;Lingual sweep for clearance of pocketing Postural Changes Seated upright at 90 degrees   CHL IP OTHER RECOMMENDATIONS 12/14/2016 Recommended Consults -- Oral Care Recommendations Oral care BID Other Recommendations Order thickener from pharmacy   CHL IP FOLLOW UP RECOMMENDATIONS 12/14/2016 Follow up Recommendations Skilled Nursing facility   [facility] IP FREQUENCY AND DURATION 12/14/2016 Speech Therapy Frequency (ACUTE ONLY) min 2x/week Treatment Duration 1 week      CHL IP ORAL PHASE 12/14/2016 Oral Phase Impaired Oral - Pudding Teaspoon -- Oral - Pudding Cup -- Oral - Honey Teaspoon -- Oral - Honey Cup -- Oral - Nectar Teaspoon -- Oral - Nectar Cup Delayed oral transit;Piecemeal swallowing;Lingual/palatal  residue;Left anterior bolus loss;Right anterior bolus loss;Holding of bolus Oral - Nectar Straw -- Oral - Thin Teaspoon -- Oral - Thin Cup Holding of bolus;Lingual/palatal residue;Piecemeal swallowing;Delayed oral transit;Left anterior bolus loss;Right anterior bolus loss Oral - Thin Straw -- Oral - Puree -- Oral - Mech Soft Delayed oral transit;Impaired mastication;Other (Comment) Oral - Regular -- Oral - Multi-Consistency -- Oral - Pill -- Oral Phase - Comment --  CHL IP PHARYNGEAL PHASE 12/14/2016 Pharyngeal Phase Impaired Pharyngeal- Pudding Teaspoon -- Pharyngeal -- Pharyngeal- Pudding Cup -- Pharyngeal -- Pharyngeal- Honey Teaspoon -- Pharyngeal -- Pharyngeal- Honey Cup -- Pharyngeal -- Pharyngeal- Nectar Teaspoon -- Pharyngeal -- Pharyngeal- Nectar Cup Pharyngeal residue - valleculae;Reduced epiglottic inversion;Delayed swallow initiation-vallecula;Delayed swallow initiation-pyriform sinuses Pharyngeal -- Pharyngeal- Nectar Straw -- Pharyngeal -- Pharyngeal- Thin Teaspoon -- Pharyngeal -- Pharyngeal- Thin Cup Penetration/Aspiration during swallow;Pharyngeal residue - valleculae;Delayed swallow initiation-vallecula;Delayed swallow initiation-pyriform sinuses;Reduced epiglottic inversion Pharyngeal Material enters airway, CONTACTS cords and not ejected out Pharyngeal- Thin Straw -- Pharyngeal -- Pharyngeal- Puree -- Pharyngeal -- Pharyngeal- Mechanical Soft WFL Pharyngeal -- Pharyngeal- Regular -- Pharyngeal -- Pharyngeal- Multi-consistency -- Pharyngeal -- Pharyngeal- Pill -- Pharyngeal -- Pharyngeal Comment --  CHL IP CERVICAL ESOPHAGEAL PHASE 12/14/2016 Cervical Esophageal Phase WFL Pudding Teaspoon -- Pudding Cup -- Honey Teaspoon -- Honey Cup -- Nectar Teaspoon -- Nectar Cup -- Nectar Straw -- Thin Teaspoon -- Thin Cup -- Thin  Straw -- Puree -- Mechanical Soft -- Regular -- Multi-consistency -- Pill -- Cervical Esophageal Comment -- No flowsheet data found. Royce Macadamia 12/14/2016, 2:59 PM Breck Coons  Lonell Face.Ed CCC-SLP Pager [phone]              Ct Head Code Stroke Wo Contrast`  Result Date: 12/12/2016 CLINICAL DATA:  Code stroke. Altered mental status. Right-sided facial droop and drooling. The patient is leaning to the right. EXAM: CT HEAD WITHOUT CONTRAST TECHNIQUE: Contiguous axial images were obtained from the base of the skull through the vertex without intravenous contrast. COMPARISON:  CT head without contrast 12/10/2016 and 08/25/2013. FINDINGS: Brain: Passed atrophy diffuse white matter disease is again seen. There may be subtle loss of gray-white differentiation along the left insular cortex compared to the prior studies. The basal ganglia are intact. Remote lacunar infarcts of the basal ganglia are stable. No acute hemorrhage is present. The ventricles are proportionate to the degree of atrophy and stable. No significant extra-axial fluid collection is present. Vascular: The left M1 segment is slightly hyperdense compared to the other vessels. This could represent thrombus. Atherosclerotic calcifications are again noted within the cavernous segments bilaterally. Skull: Calvarium is intact. No focal lytic or blastic lesions are present. Sinuses/Orbits: The paranasal sinuses and mastoid air cells are clear. Bilateral lens replacements are present. The globes and orbits are otherwise intact. ASPECTS [facility] Stroke Program Early CT Score) - Ganglionic level infarction (caudate, lentiform nuclei, internal capsule, insula, M1-M3 cortex): 6/7 - Supraganglionic infarction (M4-M6 cortex): 3/3 Total score (0-10 with 10 being normal): 9/10 IMPRESSION: 1. Possible developing nonhemorrhagic left MCA territory infarct with subtle change in gray-white differentiation along the insular cortex and asymmetric hyperdense left M1 segments suggesting possible large vessel occlusion. CTA of the head and neck would be useful. CT perfusion could be utilized for evaluation of cerebral ischemia if indicated. 2.  ASPECTS is 9/10 3. Advanced atrophy and diffuse white matter disease is otherwise stable. 4. Remote lacunar infarcts are noted within the basal ganglia bilaterally. These results were called by telephone at the time of interpretation on 12/12/2016 at 2:31 pm to Dr. Nicholas Lose, who verbally acknowledged these results. Electronically Signed   By: Marin Roberts M.D.   On: 12/12/2016 14:32    Assessment/Plan  Generalized weakness From physical deconditioning. Will have him work with physical therapy and occupational therapy team to help with gait training and muscle strengthening exercises.fall precautions. Skin care. Encourage to be out of bed.   Left MCA stroke With right sided weakness and right facial droop. Will have patient work with PT/OT as tolerated to regain strength and restore function.  Fall precautions are in place. Continue plavix 75 mg daily and lipitor 20 mg daily. Make neurology follow up.  E.coli bacteremia Continue and complete course of augmentin on 12/24/16, monitor wbc and temp curve  Urinary tract infection Continue and complete course of Augmentin on 12/24/2016. Maintain hydration and perineal hygiene.  Hyperlipidemia Lipid Panel     Component Value Date/Time   CHOL 176 12/14/2016 0509   TRIG 153 (H) 12/14/2016 0509   HDL 27 (L) 12/14/2016 0509   CHOLHDL 6.5 12/14/2016 0509   VLDL 31 12/14/2016 0509   LDLCALC 118 (H) 12/14/2016 0509   Continue lipitor 20 mg daily. LDL goal <70.  Dysphagia Aspiration precautions, coughing during his meal, have requested SLP consult ASAP. Will possibly need to be on puree diet and thickened liquids. Assist with each meal. Continue mucinex bid for cough  Dementia Supportive care, fall precautions, aspiration precautions and pressure ulcer prophylaxis  HTN Continue metoprolol tartrate 25 mg bid, monitor BP  Hypothyroidism Continue levothyroxine 75 g daily and monitor  GERD Continue Prilosec 40 mg daily and  monitor  ckd 3 Monitor bmp   Goals of care: short term rehabilitation   Labs/tests ordered: cbc, bmp 12/21/16  Family/ staff Communication: reviewed care plan with patient, her daughter and nursing supervisor  I have spent greater than 50 minutes for this encounter which includes reviewing hospital records, addressing above mentioned concerns, reviewing care plan with patient, answering patient's concerns and counseling.    Oneal Grout, MD Internal Medicine [facility] [address] [place], [place] [address] Cell Phone (Monday-Friday 8 am - 5 pm): [phone] On Call: [phone] and follow prompts after 5 pm and on weekends Office Phone: [phone] Office Fax: [phone]

## 2016-12-21 LAB — CBC AND DIFFERENTIAL
HCT: 40 % — AB (ref 41–53)
Hemoglobin: 12.4 g/dL — AB (ref 13.5–17.5)
Neutrophils Absolute: 11 /uL
Platelets: 200 10*3/uL (ref 150–399)
WBC: 14.7 10^3/mL

## 2016-12-21 LAB — BASIC METABOLIC PANEL
BUN: 32 mg/dL — AB (ref 4–21)
CREATININE: 1.7 mg/dL — AB (ref 0.6–1.3)
GLUCOSE: 141 mg/dL
POTASSIUM: 4.7 mmol/L (ref 3.4–5.3)
Sodium: 147 mmol/L (ref 137–147)

## 2016-12-24 LAB — CBC AND DIFFERENTIAL
HCT: 36 % — AB (ref 41–53)
HEMOGLOBIN: 11.5 g/dL — AB (ref 13.5–17.5)
Platelets: 164 10*3/uL (ref 150–399)
WBC: 11.2 10^3/mL

## 2016-12-24 LAB — BASIC METABOLIC PANEL
BUN: 44 mg/dL — AB (ref 4–21)
CREATININE: 1.9 mg/dL — AB (ref 0.6–1.3)
Glucose: 136 mg/dL
POTASSIUM: 4.3 mmol/L (ref 3.4–5.3)
SODIUM: 147 mmol/L (ref 137–147)

## 2016-12-28 ENCOUNTER — Encounter: Payer: Self-pay | Admitting: Adult Health

## 2016-12-28 ENCOUNTER — Non-Acute Institutional Stay (SKILLED_NURSING_FACILITY): Payer: Medicare Other | Admitting: Adult Health

## 2016-12-28 DIAGNOSIS — E039 Hypothyroidism, unspecified: Secondary | ICD-10-CM

## 2016-12-28 DIAGNOSIS — B370 Candidal stomatitis: Secondary | ICD-10-CM

## 2016-12-28 DIAGNOSIS — R1310 Dysphagia, unspecified: Secondary | ICD-10-CM | POA: Diagnosis not present

## 2016-12-28 DIAGNOSIS — J189 Pneumonia, unspecified organism: Secondary | ICD-10-CM

## 2016-12-28 NOTE — Progress Notes (Signed)
DATE:  12/28/2016   MRN:  [id number]  BIRTHDAY: [birth year]  Facility:  Nursing Home Location:  [facility]  Nursing Home Room Number: 508-P  LEVEL OF CARE:  SNF [phone]  Contact Information    Name Relation Home Work [place] Daughter   [phone]   DORA, CLAUSS  [phone]         Code Status History    Date Active Date Inactive Code Status Order ID Comments User Context   12/13/2016 11:31 AM 12/15/2016  9:11 PM Full Code [id number]  Cleora Fleet, MD Inpatient   12/10/2016 12:50 PM 12/13/2016 11:31 AM Full Code [id number]  Marcos Eke, PA-C ED   08/25/2013  5:14 PM 08/26/2013  7:25 PM Full Code [id number]  Henderson Cloud, MD Inpatient       Chief Complaint  Patient presents with  . Acute Visit    Aspiration pneumonia, oral candida    HISTORY OF PRESENT ILLNESS:  This is a 83-YO male seen for an acute visit . Charge nurse reported that patient's respiration was shallow and fast. Patient was seen in his room with caregiver @ bedside. RR=26 with o2 sat 94% on room air. Breath sounds with bilateral rales, no wheezing. CXR was positive for PNA. Caregiver reported that patient complained of soret hroat. Upon mouth inspection, it was noted that his tongue was covered with creamy brown bumps.    PAST MEDICAL HISTORY:  Past Medical History:  Diagnosis Date  . Acute metabolic encephalopathy 11/2016   Likely due to UTI  . Arthritis   . Dementia   . GERD (gastroesophageal reflux disease)   . History of lacunar cerebrovascular accident (CVA) 11/2012   Acute, no tPA given  . Hypertension   . Hypothyroidism   . Stroke [facility]) in age 27's - 89's  . UTI (urinary tract infection) 12/10/2016     CURRENT MEDICATIONS: Reviewed  Patient's Medications  New Prescriptions   No medications on file  Previous Medications   ACETAMINOPHEN (TYLENOL) 325 MG TABLET    Take 650 mg by mouth every 6 (six) hours as needed.   ALBUTEROL (PROVENTIL)  (2.5 MG/3ML) 0.083% NEBULIZER SOLUTION    Take 2.5 mg by nebulization every 4 (four) hours as needed for wheezing or shortness of breath.   ATORVASTATIN (LIPITOR) 20 MG TABLET    Take 1 tablet (20 mg total) by mouth daily at 6 PM.   CLOPIDOGREL (PLAVIX) 75 MG TABLET    Take 1 tablet (75 mg total) by mouth daily.   DEXTROMETHORPHAN-GUAIFENESIN (MUCINEX DM) 30-600 MG 12HR TABLET    Take 1 tablet by mouth 2 (two) times daily.    LEVOTHYROXINE (SYNTHROID, LEVOTHROID) 75 MCG TABLET    Take 75 mcg by mouth daily before breakfast.    METOPROLOL TARTRATE (LOPRESSOR) 25 MG TABLET    Take 25 mg by mouth 2 (two) times daily.   NUTRITIONAL SUPPLEMENTS (NUTRITIONAL SUPPLEMENT PO)    Take 1 each by mouth 2 (two) times daily. Magic Cup   NYSTATIN (MYCOSTATIN) 100000 UNIT/ML SUSPENSION    Take 5 mLs by mouth 4 (four) times daily. Swab on tongue and swallow   OMEGA-3 ACID ETHYL ESTERS (LOVAZA) 1 G CAPSULE    Take 1 g by mouth daily.    OMEPRAZOLE (PRILOSEC) 40 MG CAPSULE    Take 40 mg by mouth daily.    TAMSULOSIN (FLOMAX) 0.4 MG CAPS CAPSULE    Take 0.4 mg by mouth daily  after supper.    UNABLE TO FIND    Med Name: Med pass 120 mL by mouth daily   Modified Medications   No medications on file  Discontinued Medications   MALTODEXTRIN-XANTHAN GUM (RESOURCE THICKENUP CLEAR) POWD    Take 120 g by mouth as needed.     No Known Allergies   REVIEW OF SYSTEMS:  GENERAL: no change in appetite, no fatigue, no weight changes, no fever, chills or weakness EYES: Denies change in vision, dry eyes, eye pain, itching or discharge EARS: Denies change in hearing, ringing in ears, or earache NOSE: Denies nasal congestion or epistaxis MOUTH and THROAT: Denies oral discomfort, gingival pain or bleeding, pain from teeth or hoarseness   RESPIRATORY: no cough, SOB, DOE, wheezing, hemoptysis CARDIAC: no chest pain, edema or palpitations GI: no abdominal pain, diarrhea, constipation, heart burn, nausea or vomiting GU: Denies  dysuria, frequency, hematuria, incontinence, or discharge PSYCHIATRIC: Denies feeling of depression or anxiety. No report of hallucinations, insomnia, paranoia, or agitation     PHYSICAL EXAMINATION  GENERAL APPEARANCE: Well nourished. In no acute distress. Normal body habitus SKIN:  Left heel DTI.  HEAD: Normal in size and contour. No evidence of trauma EYES: Lids open and close normally. No blepharitis, entropion or ectropion. PERRL. Conjunctivae are clear and sclerae are white. Lenses are without opacity EARS: Pinnae are normal. Patient hears normal voice tunes of the examiner MOUTH and THROAT: Lips are without lesions. Oral mucosa is moist and without lesions. Tongue has creamy brownish brown bumps NECK: supple, trachea midline, no neck masses, no thyroid tenderness, no thyromegaly LYMPHATICS: no LAN in the neck, no supraclavicular LAN RESPIRATORY: breathing is even & unlabored, BS CTAB CARDIAC: RRR, no murmur,no extra heart sounds, no edema GI: abdomen soft, normal BS, no masses, no tenderness, no hepatomegaly, no splenomegaly EXTREMITIES:  Has generalized weakness R>L PSYCHIATRIC: Alert to self and person, disoriented to time. Affect and behavior are appropriate   LABS/RADIOLOGY: Labs reviewed: Basic Metabolic Panel:  Recent Labs  16/10/96 0446 12/12/16 0923 12/14/16 0509 12/21/16 12/24/16  NA 142 137 140 147 147  K 4.4 3.8 3.9 4.7 4.3  CL 111 105 108  --   --   CO2 23 24 21*  --   --   GLUCOSE 127* 112* 86  --   --   BUN 21* 19 20 32* 44*  CREATININE 1.75* 1.49* 1.51* 1.7* 1.9*  CALCIUM 8.3* 8.5* 8.8*  --   --    Liver Function Tests:  Recent Labs  12/10/16 1103 12/11/16 0446 12/12/16 0923  AST ALT ALKPHOS 57 55 50  BILITOT 0.4 0.4 0.7  PROT 7.6 6.6 6.9  ALBUMIN 3.2* 2.7* 2.9*    Recent Labs  12/10/16 1421  AMMONIA 17   CBC:  Recent Labs  12/10/16 1103 12/11/16 0446 12/12/16 0923 12/14/16 0509 12/21/16 12/24/16  WBC 7.3  15.9* 8.5 6.7 14.7 11.2  NEUTROABS 4.2  --   --   --  11  --   HGB 11.8* 10.4* 10.4* 11.1* 12.4* 11.5*  HCT 36.6* 32.9* 32.3* 34.1* 40* 36*  MCV 91.7 92.4 90.7 90.2  --   --   PLT 234 215 223 234 200 164   Lipid Panel:  Recent Labs  12/14/16 0509  HDL 27*    Dg Chest 2 View  Result Date: 12/10/2016 CLINICAL DATA:  Altered mental status, history of stroke EXAM: CHEST  2 VIEW COMPARISON:  07/06/2014  FINDINGS: Borderline cardiomegaly noted. No pulmonary edema. There is left basilar atelectasis or infiltrate. Degenerative changes thoracic spine again noted. IMPRESSION: No pulmonary edema. Left basilar atelectasis on infiltrate. Degenerative changes thoracic spine. Electronically Signed   By: Natasha Mead M.D.   On: 12/10/2016 12:05   Ct Head Wo Contrast  Result Date: 12/10/2016 CLINICAL DATA:  Altered mental status, increasing lethargy and weakness EXAM: CT HEAD WITHOUT CONTRAST TECHNIQUE: Contiguous axial images were obtained from the base of the skull through the vertex without intravenous contrast. COMPARISON:  CT brain scan of 08/25/2013 FINDINGS: Brain: Ventriculomegaly is unchanged as is diffuse cortical atrophy. The septum is midline in position. Moderately severe small vessel ischemic change throughout the periventricular white matter is unchanged as well. No hemorrhage, mass lesion, or acute infarction is seen. Vascular: No vascular abnormality is seen on this unenhanced study. Skull: No calvarial abnormality is seen. Sinuses/Orbits: There is mild mucosal thickening within the maxillary sinuses but no present sinusitis is seen. Other: None. IMPRESSION: 1. Stable atrophy and significant small vessel ischemic change throughout the periventricular white matter. No acute intracranial abnormality. 2. Mild mucosal thickening in the maxillary sinuses. Electronically Signed   By: Dwyane Dee M.D.   On: 12/10/2016 11:53   Mr Maxine Glenn Head Wo Contrast  Result Date: 12/13/2016 CLINICAL DATA:  83 year old  male with altered mental status. Possible early left MCA territory infarct on noncontrast head CT today. EXAM: MRI HEAD WITHOUT CONTRAST MRA HEAD WITHOUT CONTRAST TECHNIQUE: Multiplanar, multiecho pulse sequences of the brain and surrounding structures were obtained without intravenous contrast. Angiographic images of the head were obtained using MRA technique without contrast. COMPARISON:  Head CT without contrast 1409 hours. FINDINGS: MRI HEAD FINDINGS The examination had to be discontinued prior to completion due to patient coughing, confusion, and agitation. Axial and coronal diffusion weighted imaging plus axial T2, FLAIR, and T2* imaging was obtained and is intermittently degraded by motion artifact. Several Subcentimeter foci of restricted diffusion in the right posterior corona radiata and right lentiform nuclei (series 4 images 27-31. No other convincing restricted diffusion identified. Mild associated T2 and FLAIR hyperintensity with the acute diffusion findings. Superimposed chronic lacunar infarcts in the right basal ganglia and thalamus. Superimposed confluent bilateral cerebral white matter T2 and FLAIR hyperintensity with scattered areas of chronic white matter lacunar infarcts (series 6, image 19). No definite chronic cerebral blood products. No midline shift. No extra-axial collection or acute intracranial hemorrhage identified. Stable ventricle size and configuration. Patent basilar cisterns. MRA HEAD FINDINGS Study is intermittently degraded by motion artifact despite repeated imaging attempts. Antegrade flow in the distal left vertebral artery, but no antegrade flow signal in the distal right vertebral artery. Antegrade flow in the basilar without stenosis. PCA origins, P1 and P2 segments bilaterally are patent. Posterior communicating arteries are diminutive or absent. Antegrade flow in both ICA siphons. Bilateral carotid termini are patent. Both MCA and ACA origins appear patent. But  insufficient bilateral MCA and ACA branch detail otherwise. IMPRESSION: 1. Prematurely discontinued and intermittently motion degraded study due to patient agitation. 2. Scattered small acute lacunar infarcts in the right corona radiata and posterior right deep gray matter nuclei. No associated hemorrhage or mass effect. 3. No strong evidence of emergent large vessel occlusion. 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 4. Underlying chronic small vessel disease in the bilateral cerebral white matter and right deep gray matter nuclei. Electronically Signed   By: Odessa Fleming M.D.   On: 12/13/2016 14:56  Mr Brain 42 Contrast  Result Date: 12/13/2016 CLINICAL DATA:  83 year old male with altered mental status. Possible early left MCA territory infarct on noncontrast head CT today. EXAM: MRI HEAD WITHOUT CONTRAST MRA HEAD WITHOUT CONTRAST TECHNIQUE: Multiplanar, multiecho pulse sequences of the brain and surrounding structures were obtained without intravenous contrast. Angiographic images of the head were obtained using MRA technique without contrast. COMPARISON:  Head CT without contrast 1409 hours. FINDINGS: MRI HEAD FINDINGS The examination had to be discontinued prior to completion due to patient coughing, confusion, and agitation. Axial and coronal diffusion weighted imaging plus axial T2, FLAIR, and T2* imaging was obtained and is intermittently degraded by motion artifact. Several Subcentimeter foci of restricted diffusion in the right posterior corona radiata and right lentiform nuclei (series 4 images 27-31. No other convincing restricted diffusion identified. Mild associated T2 and FLAIR hyperintensity with the acute diffusion findings. Superimposed chronic lacunar infarcts in the right basal ganglia and thalamus. Superimposed confluent bilateral cerebral white matter T2 and FLAIR hyperintensity with scattered areas of chronic white matter lacunar infarcts (series 6, image 19). No  definite chronic cerebral blood products. No midline shift. No extra-axial collection or acute intracranial hemorrhage identified. Stable ventricle size and configuration. Patent basilar cisterns. MRA HEAD FINDINGS Study is intermittently degraded by motion artifact despite repeated imaging attempts. Antegrade flow in the distal left vertebral artery, but no antegrade flow signal in the distal right vertebral artery. Antegrade flow in the basilar without stenosis. PCA origins, P1 and P2 segments bilaterally are patent. Posterior communicating arteries are diminutive or absent. Antegrade flow in both ICA siphons. Bilateral carotid termini are patent. Both MCA and ACA origins appear patent. But insufficient bilateral MCA and ACA branch detail otherwise. IMPRESSION: 1. Prematurely discontinued and intermittently motion degraded study due to patient agitation. 2. Scattered small acute lacunar infarcts in the right corona radiata and posterior right deep gray matter nuclei. No associated hemorrhage or mass effect. 3. No strong evidence of emergent large vessel occlusion. 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 4. Underlying chronic small vessel disease in the bilateral cerebral white matter and right deep gray matter nuclei. Electronically Signed   By: Odessa Fleming M.D.   On: 12/13/2016 14:56   Dg Chest Port 1 View  Result Date: 12/11/2016 CLINICAL DATA:  Acute onset of cough.  Initial encounter. EXAM: PORTABLE CHEST 1 VIEW COMPARISON:  Chest radiograph performed 12/10/2016 FINDINGS: The lungs are well-aerated and clear. There is no evidence of focal opacification, pleural effusion or pneumothorax. The cardiomediastinal silhouette is enlarged. No acute osseous abnormalities are seen. IMPRESSION: Cardiomegaly.  Lungs remain grossly clear. Electronically Signed   By: Roanna Raider M.D.   On: 12/11/2016 23:43   Dg Swallowing Func-speech Pathology  Result Date: 12/14/2016 Objective Swallowing  Evaluation: Type of Study: MBS-Modified Barium Swallow Study Patient Details Name: Deran Barro MRN: [id number] Date of Birth: 02/28/1919 Today's Date: 12/14/2016 Time: SLP Start Time (ACUTE ONLY): 1329-SLP Stop Time (ACUTE ONLY): 1343 SLP Time Calculation (min) (ACUTE ONLY): 14 min Past Medical History: Past Medical History: Diagnosis Date . Arthritis  . Dementia  . GERD (gastroesophageal reflux disease)  . Hypertension  . Hypothyroidism  . Stroke [facility]) in age 7's - 45's . UTI (urinary tract infection) 12/10/2016 Past Surgical History: Past Surgical History: Procedure Laterality Date . CATARACT EXTRACTION Bilateral  HPI: Jonell Krontz a 83 y.o.malewith medical history significant forarthritis, dementia, [name], hypertension, hypothyroidism,stroke, brought to the emergency department by his daughter, due to 1 to 2  day history of increased debilitation, and acute confusion. Chest x ray grossly clear. MRI showed scattered small acute lacunar infarcts in the right corona radiata and posterior right deep gray matter nuclei. No Data Recorded Assessment / Plan / Recommendation CHL IP CLINICAL IMPRESSIONS 12/14/2016 Clinical Impression Mr. Dougal presents with a moderate oral and moderate sensorimotor pharyngeal dysphagia. Pt required assistance for feeding as well as verbal cueing and reminders throughout the MBS to "swallow". Oral phase impairments > pharyngeal impairments consisting of delayed oral transit/holding, lingual/palatal residue, and poor labial closure resulting in anterior spillage. Pt reported that he has partial dentures in his room, although they were unavailable during this study. Observed moderate vallecular residue with each tested consistency that was ineffectively cleared with cues for second swallows. Cup sips of thin liquids resulted in silent penetration to the level of the vocal cords with a weak, cued cough that did not clear penetrates. Compensatory strategies or postural changes were not  attempted due to pt's hx dementia and unlikely compliance/carryover. Educated pt and daughter re: results of MBS and diet recomendations. Recommend Dys 2 solids, nectar thick liquids (no straws), meds crushed, lingual sweep for clearance of pocketing. ST will f/u briefly for continued education and for diet tolerance. SLP Visit Diagnosis Dysphagia, oropharyngeal phase (R13.12) Attention and concentration deficit following -- Frontal lobe and executive function deficit following -- Impact on safety and function Moderate aspiration risk   CHL IP TREATMENT RECOMMENDATION 12/14/2016 Treatment Recommendations Therapy as outlined in treatment plan below   Prognosis 12/14/2016 Prognosis for Safe Diet Advancement Fair Barriers to Reach Goals Cognitive deficits Barriers/Prognosis Comment -- CHL IP DIET RECOMMENDATION 12/14/2016 SLP Diet Recommendations Dysphagia 2 (Fine chop) solids;Nectar thick liquid Liquid Administration via Cup;No straw Medication Administration Crushed with puree Compensations Slow rate;Small sips/bites;Minimize environmental distractions;Monitor for anterior loss;Lingual sweep for clearance of pocketing Postural Changes Seated upright at 90 degrees   CHL IP OTHER RECOMMENDATIONS 12/14/2016 Recommended Consults -- Oral Care Recommendations Oral care BID Other Recommendations Order thickener from pharmacy   CHL IP FOLLOW UP RECOMMENDATIONS 12/14/2016 Follow up Recommendations Skilled Nursing facility   [facility] IP FREQUENCY AND DURATION 12/14/2016 Speech Therapy Frequency (ACUTE ONLY) min 2x/week Treatment Duration 1 week      CHL IP ORAL PHASE 12/14/2016 Oral Phase Impaired Oral - Pudding Teaspoon -- Oral - Pudding Cup -- Oral - Honey Teaspoon -- Oral - Honey Cup -- Oral - Nectar Teaspoon -- Oral - Nectar Cup Delayed oral transit;Piecemeal swallowing;Lingual/palatal residue;Left anterior bolus loss;Right anterior bolus loss;Holding of bolus Oral - Nectar Straw -- Oral - Thin Teaspoon -- Oral - Thin Cup Holding of  bolus;Lingual/palatal residue;Piecemeal swallowing;Delayed oral transit;Left anterior bolus loss;Right anterior bolus loss Oral - Thin Straw -- Oral - Puree -- Oral - Mech Soft Delayed oral transit;Impaired mastication;Other (Comment) Oral - Regular -- Oral - Multi-Consistency -- Oral - Pill -- Oral Phase - Comment --  CHL IP PHARYNGEAL PHASE 12/14/2016 Pharyngeal Phase Impaired Pharyngeal- Pudding Teaspoon -- Pharyngeal -- Pharyngeal- Pudding Cup -- Pharyngeal -- Pharyngeal- Honey Teaspoon -- Pharyngeal -- Pharyngeal- Honey Cup -- Pharyngeal -- Pharyngeal- Nectar Teaspoon -- Pharyngeal -- Pharyngeal- Nectar Cup Pharyngeal residue - valleculae;Reduced epiglottic inversion;Delayed swallow initiation-vallecula;Delayed swallow initiation-pyriform sinuses Pharyngeal -- Pharyngeal- Nectar Straw -- Pharyngeal -- Pharyngeal- Thin Teaspoon -- Pharyngeal -- Pharyngeal- Thin Cup Penetration/Aspiration during swallow;Pharyngeal residue - valleculae;Delayed swallow initiation-vallecula;Delayed swallow initiation-pyriform sinuses;Reduced epiglottic inversion Pharyngeal Material enters airway, CONTACTS cords and not ejected out Pharyngeal- Thin Straw -- Pharyngeal -- Pharyngeal- Puree -- Pharyngeal --  Pharyngeal- Mechanical Soft WFL Pharyngeal -- Pharyngeal- Regular -- Pharyngeal -- Pharyngeal- Multi-consistency -- Pharyngeal -- Pharyngeal- Pill -- Pharyngeal -- Pharyngeal Comment --  CHL IP CERVICAL ESOPHAGEAL PHASE 12/14/2016 Cervical Esophageal Phase WFL Pudding Teaspoon -- Pudding Cup -- Honey Teaspoon -- Honey Cup -- Nectar Teaspoon -- Nectar Cup -- Nectar Straw -- Thin Teaspoon -- Thin Cup -- Thin Straw -- Puree -- Mechanical Soft -- Regular -- Multi-consistency -- Pill -- Cervical Esophageal Comment -- No flowsheet data found. Royce Macadamia 12/14/2016, 2:59 PM Breck Coons Lonell Face.Ed CCC-SLP Pager [phone]              Ct Head Code Stroke Wo Contrast`  Result Date: 12/12/2016 CLINICAL DATA:  Code stroke. Altered  mental status. Right-sided facial droop and drooling. The patient is leaning to the right. EXAM: CT HEAD WITHOUT CONTRAST TECHNIQUE: Contiguous axial images were obtained from the base of the skull through the vertex without intravenous contrast. COMPARISON:  CT head without contrast 12/10/2016 and 08/25/2013. FINDINGS: Brain: Passed atrophy diffuse white matter disease is again seen. There may be subtle loss of gray-white differentiation along the left insular cortex compared to the prior studies. The basal ganglia are intact. Remote lacunar infarcts of the basal ganglia are stable. No acute hemorrhage is present. The ventricles are proportionate to the degree of atrophy and stable. No significant extra-axial fluid collection is present. Vascular: The left M1 segment is slightly hyperdense compared to the other vessels. This could represent thrombus. Atherosclerotic calcifications are again noted within the cavernous segments bilaterally. Skull: Calvarium is intact. No focal lytic or blastic lesions are present. Sinuses/Orbits: The paranasal sinuses and mastoid air cells are clear. Bilateral lens replacements are present. The globes and orbits are otherwise intact. ASPECTS [facility] Stroke Program Early CT Score) - Ganglionic level infarction (caudate, lentiform nuclei, internal capsule, insula, M1-M3 cortex): 6/7 - Supraganglionic infarction (M4-M6 cortex): 3/3 Total score (0-10 with 10 being normal): 9/10 IMPRESSION: 1. Possible developing nonhemorrhagic left MCA territory infarct with subtle change in gray-white differentiation along the insular cortex and asymmetric hyperdense left M1 segments suggesting possible large vessel occlusion. CTA of the head and neck would be useful. CT perfusion could be utilized for evaluation of cerebral ischemia if indicated. 2. ASPECTS is 9/10 3. Advanced atrophy and diffuse white matter disease is otherwise stable. 4. Remote lacunar infarcts are noted within the basal ganglia  bilaterally. These results were called by telephone at the time of interpretation on 12/12/2016 at 2:31 pm to Dr. Nicholas Lose, who verbally acknowledged these results. Electronically Signed   By: Marin Roberts M.D.   On: 12/12/2016 14:32    ASSESSMENT/PLAN:  HCAP - start Rocephin 1 gm IM Q D X 7 days and Florastor 250 mg 1 capsule PO BID X 10 days, albuterol 2.5 mg/59ml 1 neb Q 4 hours PRN   Oral Thrush - start Nystatin 100,000 units/ml give 5 ml swab on tongue and swallow QID X 10 days  Hypothyroidism - continue Levothyroxine 75 mcg 1 tab PO Q D Lab Results  Component Value Date   TSH 4.884 (H) 08/25/2013   Dysphagia - continue puree diet with NTL; aspiration precaution     Monina C. Medina-Vargas - NP    [employer] [phone]

## 2016-12-31 ENCOUNTER — Emergency Department (HOSPITAL_COMMUNITY): Payer: Medicare Other

## 2016-12-31 ENCOUNTER — Inpatient Hospital Stay (HOSPITAL_COMMUNITY)
Admission: EM | Admit: 2016-12-31 | Discharge: 2017-01-02 | DRG: 640 | Disposition: A | Discharge status: Home / Self Care | Payer: Medicare Other | Attending: Internal Medicine | Admitting: Internal Medicine

## 2016-12-31 ENCOUNTER — Encounter (HOSPITAL_COMMUNITY): Payer: Self-pay

## 2016-12-31 DIAGNOSIS — I517 Cardiomegaly: Secondary | ICD-10-CM | POA: Diagnosis not present

## 2016-12-31 DIAGNOSIS — Z66 Do not resuscitate: Secondary | ICD-10-CM | POA: Diagnosis not present

## 2016-12-31 DIAGNOSIS — E871 Hypo-osmolality and hyponatremia: Secondary | ICD-10-CM | POA: Insufficient documentation

## 2016-12-31 DIAGNOSIS — E870 Hyperosmolality and hypernatremia: Principal | ICD-10-CM | POA: Diagnosis present

## 2016-12-31 DIAGNOSIS — Z7902 Long term (current) use of antithrombotics/antiplatelets: Secondary | ICD-10-CM

## 2016-12-31 DIAGNOSIS — N289 Disorder of kidney and ureter, unspecified: Secondary | ICD-10-CM | POA: Diagnosis not present

## 2016-12-31 DIAGNOSIS — I129 Hypertensive chronic kidney disease with stage 1 through stage 4 chronic kidney disease, or unspecified chronic kidney disease: Secondary | ICD-10-CM | POA: Diagnosis present

## 2016-12-31 DIAGNOSIS — K219 Gastro-esophageal reflux disease without esophagitis: Secondary | ICD-10-CM | POA: Diagnosis present

## 2016-12-31 DIAGNOSIS — I639 Cerebral infarction, unspecified: Secondary | ICD-10-CM

## 2016-12-31 DIAGNOSIS — E039 Hypothyroidism, unspecified: Secondary | ICD-10-CM | POA: Diagnosis present

## 2016-12-31 DIAGNOSIS — Z515 Encounter for palliative care: Secondary | ICD-10-CM | POA: Diagnosis not present

## 2016-12-31 DIAGNOSIS — N183 Chronic kidney disease, stage 3 unspecified: Secondary | ICD-10-CM | POA: Diagnosis present

## 2016-12-31 DIAGNOSIS — R627 Adult failure to thrive: Secondary | ICD-10-CM

## 2016-12-31 DIAGNOSIS — E860 Dehydration: Secondary | ICD-10-CM | POA: Diagnosis present

## 2016-12-31 DIAGNOSIS — I69391 Dysphagia following cerebral infarction: Secondary | ICD-10-CM

## 2016-12-31 DIAGNOSIS — G8194 Hemiplegia, unspecified affecting left nondominant side: Secondary | ICD-10-CM | POA: Diagnosis present

## 2016-12-31 DIAGNOSIS — L89153 Pressure ulcer of sacral region, stage 3: Secondary | ICD-10-CM | POA: Diagnosis present

## 2016-12-31 DIAGNOSIS — R531 Weakness: Secondary | ICD-10-CM

## 2016-12-31 DIAGNOSIS — M1990 Unspecified osteoarthritis, unspecified site: Secondary | ICD-10-CM | POA: Diagnosis present

## 2016-12-31 DIAGNOSIS — Z87440 Personal history of urinary (tract) infections: Secondary | ICD-10-CM

## 2016-12-31 DIAGNOSIS — R109 Unspecified abdominal pain: Secondary | ICD-10-CM

## 2016-12-31 DIAGNOSIS — F0390 Unspecified dementia without behavioral disturbance: Secondary | ICD-10-CM | POA: Diagnosis present

## 2016-12-31 DIAGNOSIS — R6251 Failure to thrive (child): Secondary | ICD-10-CM | POA: Diagnosis not present

## 2016-12-31 DIAGNOSIS — Z79899 Other long term (current) drug therapy: Secondary | ICD-10-CM

## 2016-12-31 DIAGNOSIS — R638 Other symptoms and signs concerning food and fluid intake: Secondary | ICD-10-CM | POA: Diagnosis present

## 2016-12-31 DIAGNOSIS — R1312 Dysphagia, oropharyngeal phase: Secondary | ICD-10-CM

## 2016-12-31 LAB — URINALYSIS, ROUTINE W REFLEX MICROSCOPIC
BACTERIA UA: NONE SEEN
BILIRUBIN URINE: NEGATIVE
Bilirubin Urine: NEGATIVE
Glucose, UA: NEGATIVE mg/dL
Glucose, UA: NEGATIVE mg/dL
KETONES UR: NEGATIVE mg/dL
Ketones, ur: NEGATIVE mg/dL
LEUKOCYTES UA: NEGATIVE
Leukocytes, UA: NEGATIVE
NITRITE: NEGATIVE
Nitrite: NEGATIVE
PH: 5 (ref 5.0–8.0)
PROTEIN: NEGATIVE mg/dL
Protein, ur: 30 mg/dL — AB
SPECIFIC GRAVITY, URINE: 1.018 (ref 1.005–1.030)
Specific Gravity, Urine: 1.028 (ref 1.005–1.030)
pH: 5 (ref 5.0–8.0)

## 2016-12-31 LAB — CBC WITH DIFFERENTIAL/PLATELET
Basophils Absolute: 0 10*3/uL (ref 0.0–0.1)
Basophils Relative: 0 %
EOS PCT: 4 %
Eosinophils Absolute: 0.6 10*3/uL (ref 0.0–0.7)
HEMATOCRIT: 38.5 % — AB (ref 39.0–52.0)
Hemoglobin: 11.7 g/dL — ABNORMAL LOW (ref 13.0–17.0)
LYMPHS ABS: 2 10*3/uL (ref 0.7–4.0)
Lymphocytes Relative: 14 %
MCH: 29.3 pg (ref 26.0–34.0)
MCHC: 30.4 g/dL (ref 30.0–36.0)
MCV: 96.3 fL (ref 78.0–100.0)
MONOS PCT: 5 %
Monocytes Absolute: 0.7 10*3/uL (ref 0.1–1.0)
Neutro Abs: 11.1 10*3/uL — ABNORMAL HIGH (ref 1.7–7.7)
Neutrophils Relative %: 77 %
Platelets: UNDETERMINED 10*3/uL (ref 150–400)
RBC: 4 MIL/uL — AB (ref 4.22–5.81)
RDW: 14.3 % (ref 11.5–15.5)
WBC: 14.4 10*3/uL — AB (ref 4.0–10.5)

## 2016-12-31 LAB — SODIUM, URINE, RANDOM: Sodium, Ur: 118 mmol/L

## 2016-12-31 LAB — COMPREHENSIVE METABOLIC PANEL
ALT: 135 U/L — AB (ref 17–63)
AST: 80 U/L — AB (ref 15–41)
Albumin: 2.3 g/dL — ABNORMAL LOW (ref 3.5–5.0)
Alkaline Phosphatase: 83 U/L (ref 38–126)
Anion gap: 11 (ref 5–15)
BILIRUBIN TOTAL: 0.7 mg/dL (ref 0.3–1.2)
BUN: 52 mg/dL — AB (ref 6–20)
CALCIUM: 8.6 mg/dL — AB (ref 8.9–10.3)
CO2: 21 mmol/L — ABNORMAL LOW (ref 22–32)
CREATININE: 1.9 mg/dL — AB (ref 0.61–1.24)
Chloride: 120 mmol/L — ABNORMAL HIGH (ref 101–111)
GFR calc Af Amer: 33 mL/min — ABNORMAL LOW (ref >60)
GFR, EST NON AFRICAN AMERICAN: 28 mL/min — AB (ref >60)
Glucose, Bld: 136 mg/dL — ABNORMAL HIGH (ref 65–99)
Potassium: 4.9 mmol/L (ref 3.5–5.1)
Sodium: 152 mmol/L — ABNORMAL HIGH (ref 135–145)
TOTAL PROTEIN: 7.7 g/dL (ref 6.5–8.1)

## 2016-12-31 LAB — CREATININE, SERUM
Creatinine, Ser: 1.65 mg/dL — ABNORMAL HIGH (ref 0.61–1.24)
GFR calc Af Amer: 39 mL/min — ABNORMAL LOW (ref >60)
GFR calc non Af Amer: 33 mL/min — ABNORMAL LOW (ref >60)

## 2016-12-31 LAB — OSMOLALITY: OSMOLALITY: 339 mosm/kg — AB (ref 275–295)

## 2016-12-31 LAB — LIPASE, BLOOD: LIPASE: 44 U/L (ref 11–51)

## 2016-12-31 LAB — MRSA PCR SCREENING: MRSA by PCR: NEGATIVE

## 2016-12-31 LAB — OSMOLALITY, URINE: OSMOLALITY UR: 630 mosm/kg (ref 300–900)

## 2016-12-31 MED ORDER — METOPROLOL TARTRATE 5 MG/5ML IV SOLN
5.0000 mg | Freq: Four times a day (QID) | INTRAVENOUS | Status: DC
  Administered 2016-12-31 – 2017-01-02 (×6): 5 mg via INTRAVENOUS
  Filled 2016-12-31 (×6): qty 5

## 2016-12-31 MED ORDER — ONDANSETRON HCL 4 MG/2ML IJ SOLN: 4.0000 mg | Freq: Four times a day (QID) | INTRAMUSCULAR | Status: DC | PRN

## 2016-12-31 MED ORDER — ATORVASTATIN CALCIUM 20 MG PO TABS: 20.0000 mg | ORAL_TABLET | Freq: Every day | ORAL | Status: DC

## 2016-12-31 MED ORDER — CLOPIDOGREL BISULFATE 75 MG PO TABS: 75.0000 mg | ORAL_TABLET | Freq: Every day | ORAL | Status: DC

## 2016-12-31 MED ORDER — LEVOTHYROXINE SODIUM 75 MCG PO TABS: 75.0000 ug | ORAL_TABLET | Freq: Every day | ORAL | Status: DC

## 2016-12-31 MED ORDER — LEVOTHYROXINE SODIUM 100 MCG IV SOLR
37.5000 ug | Freq: Every day | INTRAVENOUS | Status: DC
  Administered 2017-01-01 – 2017-01-02 (×2): 37.5 ug via INTRAVENOUS
  Filled 2016-12-31 (×2): qty 5

## 2016-12-31 MED ORDER — ALBUTEROL SULFATE (2.5 MG/3ML) 0.083% IN NEBU: 2.5000 mg | INHALATION_SOLUTION | RESPIRATORY_TRACT | Status: DC | PRN

## 2016-12-31 MED ORDER — ACETAMINOPHEN 650 MG RE SUPP: 650.0000 mg | Freq: Four times a day (QID) | RECTAL | Status: DC | PRN

## 2016-12-31 MED ORDER — ONDANSETRON HCL 4 MG PO TABS: 4.0000 mg | ORAL_TABLET | Freq: Four times a day (QID) | ORAL | Status: DC | PRN

## 2016-12-31 MED ORDER — PANTOPRAZOLE SODIUM 40 MG PO TBEC: 40.0000 mg | DELAYED_RELEASE_TABLET | Freq: Every day | ORAL | Status: DC

## 2016-12-31 MED ORDER — SODIUM CHLORIDE 0.9 % IV BOLUS (SEPSIS)
2000.0000 mL | Freq: Once | INTRAVENOUS | Status: AC
  Administered 2016-12-31: 2000 mL via INTRAVENOUS

## 2016-12-31 MED ORDER — IOPAMIDOL (ISOVUE-300) INJECTION 61%
INTRAVENOUS | Status: AC
  Administered 2016-12-31: 100 mL
  Filled 2016-12-31: qty 100

## 2016-12-31 MED ORDER — HEPARIN SODIUM (PORCINE) 5000 UNIT/ML IJ SOLN
5000.0000 [IU] | Freq: Three times a day (TID) | INTRAMUSCULAR | Status: DC
  Administered 2016-12-31 – 2017-01-02 (×5): 5000 [IU] via SUBCUTANEOUS
  Filled 2016-12-31 (×5): qty 1

## 2016-12-31 MED ORDER — ACETAMINOPHEN 325 MG PO TABS: 650.0000 mg | ORAL_TABLET | Freq: Four times a day (QID) | ORAL | Status: DC | PRN

## 2016-12-31 MED ORDER — METOPROLOL TARTRATE 25 MG PO TABS: 25.0000 mg | ORAL_TABLET | Freq: Two times a day (BID) | ORAL | Status: DC

## 2016-12-31 MED ORDER — NYSTATIN 100000 UNIT/ML MT SUSP
5.0000 mL | Freq: Four times a day (QID) | OROMUCOSAL | Status: DC
  Administered 2016-12-31 – 2017-01-02 (×3): 500000 [IU] via ORAL
  Filled 2016-12-31 (×3): qty 5

## 2016-12-31 MED ORDER — TAMSULOSIN HCL 0.4 MG PO CAPS: 0.4000 mg | ORAL_CAPSULE | Freq: Every day | ORAL | Status: DC

## 2016-12-31 MED ORDER — MORPHINE SULFATE (PF) 4 MG/ML IV SOLN
4.0000 mg | Freq: Once | INTRAVENOUS | Status: AC
  Administered 2016-12-31: 4 mg via INTRAVENOUS
  Filled 2016-12-31: qty 1

## 2016-12-31 MED ORDER — DM-GUAIFENESIN ER 30-600 MG PO TB12
1.0000 | ORAL_TABLET | Freq: Two times a day (BID) | ORAL | Status: DC
  Filled 2016-12-31 (×2): qty 1

## 2016-12-31 MED ORDER — SODIUM CHLORIDE 0.9 % IV SOLN
INTRAVENOUS | Status: AC
  Administered 2016-12-31 – 2017-01-01 (×2): via INTRAVENOUS

## 2016-12-31 NOTE — H&P (Signed)
History and Physical    Yakub Lodes ZOX:[id number] DOB: 1918-10-12 DOA: 12/31/2016  PCP: Boneta Lucks, NP Patient coming from: facility  Chief Complaint: generalized weakness decreased oral intake  HPI: Draedyn Weidinger is a 83 y.o. male with medical history significant for recent stroke, chronic kidney disease, dehydration, Escherichia coli bacteremia, hypothyroidism presents to the emergency department from the facility with the chief complaint of generalized weakness decreased oral intake. Initial evaluation reveals hypernatremia failure to thrive.  Information is obtained from the chart and the daughters who are at the bedside. Patient was discharged 16 days ago after being hospitalized for 3 days with a lacunar CVA and UTI. His discharge to facility and the daughters say he was doing "okay". Does report left side is "gone". He continues to have difficulty swallowing but would with encouragement. Over the last 2 days he has not taken anything by mouth. He also reports a generalized weakness. He's been nonambulatory since the stroke. He is not able to make his wants and needs known consistently. Enzymes include intermittent lethargy. Patient attempts to follow commands. He denies pain or discomfort. No reports of any recent nausea vomiting diarrhea. No fever chills cough.  ED Course: In the emergency department he's afebrile hemodynamically stable and not hypoxic  Review of Systems: As per HPI otherwise all other systems reviewed and are negative.   Ambulatory Status: Is mostly bedridden since recent stroke with left hemiparesis   Past Medical History:  Diagnosis Date  . Acute metabolic encephalopathy 11/2016   Likely due to UTI  . Arthritis   . Dementia   . GERD (gastroesophageal reflux disease)   . History of lacunar cerebrovascular accident (CVA) 11/2012   Acute, no tPA given  . Hypertension   . Hypothyroidism   . Stroke [facility]) in age 73's - 72's  . UTI (urinary tract infection)  12/10/2016    Past Surgical History:  Procedure Laterality Date  . CATARACT EXTRACTION Bilateral     Social History   Social History  . Marital status: Single    Spouse name: N/A  . Number of children: N/A  . Years of education: N/A   Occupational History  . Not on file.   Social History Main Topics  . Smoking status: Never Smoker  . Smokeless tobacco: Never Used  . Alcohol use No  . Drug use: No  . Sexual activity: Not on file   Other Topics Concern  . Not on file   Social History Narrative  . No narrative on file    No Known Allergies  Family History  Problem Relation Age of Onset  . Family history unknown: Yes    Prior to Admission medications   Medication Sig Start Date End Date Taking? Authorizing Provider  albuterol (PROVENTIL) (2.5 MG/3ML) 0.083% nebulizer solution Take 2.5 mg by nebulization every 4 (four) hours as needed for wheezing or shortness of breath.   Yes Historical Provider, MD  atorvastatin (LIPITOR) 20 MG tablet Take 1 tablet (20 mg total) by mouth daily at 6 PM. 12/15/16  Yes Clanford L Johnson, MD  cefTRIAXone (ROCEPHIN) 1 g injection Inject 1 g into the muscle daily. For 7 days 12/28/16 01/03/17 Yes Historical Provider, MD  clopidogrel (PLAVIX) 75 MG tablet Take 1 tablet (75 mg total) by mouth daily. 12/16/16  Yes Clanford Cyndie Mull, MD  dextromethorphan-guaiFENesin [facility] DM) 30-600 MG 12hr tablet Take 1 tablet by mouth 2 (two) times daily.    Yes Historical Provider, MD  levothyroxine (SYNTHROID, LEVOTHROID) 75  MCG tablet Take 75 mcg by mouth daily before breakfast.    Yes Historical Provider, MD  metoprolol tartrate (LOPRESSOR) 25 MG tablet Take 25 mg by mouth 2 (two) times daily.   Yes Historical Provider, MD  Nutritional Supplements (NUTRITIONAL SUPPLEMENT PO) Take 1 each by mouth 2 (two) times daily. Magic Cup   Yes Historical Provider, MD  nystatin (MYCOSTATIN) 100000 UNIT/ML suspension Take 5 mLs by mouth 4 (four) times daily. Swab on  tongue and swallow 12/29/16 01/07/17 Yes Historical Provider, MD  omega-3 acid ethyl esters (LOVAZA) 1 G capsule Take 1 g by mouth daily.    Yes Historical Provider, MD  omeprazole (PRILOSEC) 40 MG capsule Take 40 mg by mouth daily.    Yes Historical Provider, MD  saccharomyces boulardii (FLORASTOR) 250 MG capsule Take 250 mg by mouth 2 (two) times daily. For 10 days 12/28/16 01/06/17 Yes Historical Provider, MD  tamsulosin (FLOMAX) 0.4 MG CAPS capsule Take 0.4 mg by mouth daily after supper.    Yes Historical Provider, MD  UNABLE TO FIND Med Name: Med pass 120 mL by mouth daily    Yes Historical Provider, MD  acetaminophen (TYLENOL) 325 MG tablet Take 650 mg by mouth every 6 (six) hours as needed for mild pain.     Historical Provider, MD    Physical Exam: Vitals:   12/31/16 1445 12/31/16 1500 12/31/16 1515 12/31/16 1559  BP: 127/82 136/76 (!) 145/77 137/73  Pulse: 88 94 90 97  Resp:    20  Temp:    97.6 F (36.4 C)  TempSrc:    Oral  SpO2: 98% 96% 94% 98%  Weight:    84.5 kg (186 lb 4.6 oz)     General:  Appears slightly lethargic calm and comfortable Eyes:  PERRL, EOMI, normal lids, iris ENT:  grossly normal hearing, lips & tongue, his membranes of his mouth are pale and dry Neck:  no LAD, masses or thyromegaly Cardiovascular:  RRR, no m/r/g. No LE edema.  Respiratory:  Normal effort respirations somewhat shallow breath sounds distant to hear no crackles no wheezes Abdomen:  soft, ntnd, bowel sounds no guarding or rebounding Skin:  no rash or induration seen on limited exam Musculoskeletal:  grossly normal tone BUE/BLE, good ROM, no bony abnormality Psychiatric:  grossly normal mood and affect, speech fluent and appropriate, AOx3 Neurologic:  Opens eyes to verbal stimuli attempts to answer questions is able to nod head right grip 3 out of 5 left grip absent  Labs on Admission: I have personally reviewed following labs and imaging studies  CBC:  Recent Labs Lab 12/31/16 1030    WBC 14.4*  NEUTROABS 11.1*  HGB 11.7*  HCT 38.5*  MCV 96.3  PLT PLATELET CLUMPS NOTED ON SMEAR, UNABLE TO ESTIMATE   Basic Metabolic Panel:  Recent Labs Lab 12/31/16 1030  NA 152*  K 4.9  CL 120*  CO2 21*  GLUCOSE 136*  BUN 52*  CREATININE 1.90*  CALCIUM 8.6*   GFR: Estimated Creatinine Clearance: 25.7 mL/min (A) (by C-G formula based on SCr of 1.9 mg/dL (H)). Liver Function Tests:  Recent Labs Lab 12/31/16 1030  AST 80*  ALT 135*  ALKPHOS 83  BILITOT 0.7  PROT 7.7  ALBUMIN 2.3*    Recent Labs Lab 12/31/16 1030  LIPASE 44   No results for input(s): AMMONIA in the last 168 hours. Coagulation Profile: No results for input(s): INR, PROTIME in the last 168 hours. Cardiac Enzymes: No results for input(s): CKTOTAL, CKMB,  CKMBINDEX, TROPONINI in the last 168 hours. BNP (last 3 results) No results for input(s): PROBNP in the last 8760 hours. HbA1C: No results for input(s): HGBA1C in the last 72 hours. CBG: No results for input(s): GLUCAP in the last 168 hours. Lipid Profile: No results for input(s): CHOL, HDL, LDLCALC, TRIG, CHOLHDL, LDLDIRECT in the last 72 hours. Thyroid Function Tests: No results for input(s): TSH, T4TOTAL, FREET4, T3FREE, THYROIDAB in the last 72 hours. Anemia Panel: No results for input(s): VITAMINB12, FOLATE, FERRITIN, TIBC, IRON, RETICCTPCT in the last 72 hours. Urine analysis:    Component Value Date/Time   COLORURINE YELLOW 12/31/2016 1048   APPEARANCEUR CLEAR 12/31/2016 1048   LABSPEC 1.018 12/31/2016 1048   PHURINE 5.0 12/31/2016 1048   GLUCOSEU NEGATIVE 12/31/2016 1048   HGBUR MODERATE (A) 12/31/2016 1048   BILIRUBINUR NEGATIVE 12/31/2016 1048   KETONESUR NEGATIVE 12/31/2016 1048   PROTEINUR 30 (A) 12/31/2016 1048   UROBILINOGEN 1.0 08/25/2013 1427   NITRITE NEGATIVE 12/31/2016 1048   LEUKOCYTESUR NEGATIVE 12/31/2016 1048    Creatinine Clearance: Estimated Creatinine Clearance: 25.7 mL/min (A) (by C-G formula based on  SCr of 1.9 mg/dL (H)).  Sepsis Labs: @LABRCNTIP (procalcitonin:4,lacticidven:4) )No results found for this or any previous visit (from the past 240 hour(s)).   Radiological Exams on Admission: Dg Chest 1 View  Result Date: 12/31/2016 CLINICAL DATA:  Abdominal pain.  Failure to thrive EXAM: CHEST 1 VIEW COMPARISON:  12/11/2016 FINDINGS: Chronic mild cardiomegaly. Aortic tortuosity, stable from 12/10/2016. There is no edema, consolidation, effusion, or pneumothorax. Spondylosis. No acute osseous finding. IMPRESSION: No acute finding. Mild cardiomegaly. Electronically Signed   By: Marnee Spring M.D.   On: 12/31/2016 11:08   Ct Abdomen Pelvis W Contrast  Result Date: 12/31/2016 CLINICAL DATA:  Right-sided abdominal pain. EXAM: CT ABDOMEN AND PELVIS WITH CONTRAST TECHNIQUE: Multidetector CT imaging of the abdomen and pelvis was performed using the standard protocol following bolus administration of intravenous contrast. CONTRAST:  ISOVUE-300 IOPAMIDOL (ISOVUE-300) INJECTION 61% COMPARISON:  CT scan of November 09, 2008. FINDINGS: Lower chest: Minimal right posterior basilar subsegmental atelectasis is noted. Hepatobiliary: No focal liver abnormality is seen. No gallstones, gallbladder wall thickening, or biliary dilatation. Pancreas: Unremarkable. No pancreatic ductal dilatation or surrounding inflammatory changes. Spleen: Normal in size without focal abnormality. Adrenals/Urinary Tract: Adrenal glands are unremarkable. Bilateral renal cysts are noted. No hydronephrosis or renal obstruction is noted. No renal or ureteral calculi are noted. Urinary bladder is unremarkable. Stomach/Bowel: Stomach is within normal limits. Appendix appears normal. No evidence of bowel wall thickening, distention, or inflammatory changes. Sigmoid diverticulosis is noted. Vascular/Lymphatic: Aortic atherosclerosis. No enlarged abdominal or pelvic lymph nodes. Reproductive: Prostate is unremarkable. Other: No abdominal wall  hernia or abnormality. No abdominopelvic ascites. Musculoskeletal: No acute or significant osseous findings. IMPRESSION: Aortic atherosclerosis. Bilateral renal cysts. No acute abnormality seen in the abdomen or pelvis. Electronically Signed   By: Lupita Raider, M.D.   On: 12/31/2016 13:24    EKG: Independently reviewed. Sinus tachycardia Multiple ventricular premature complexes Anteroseptal infarct, old Baseline wander in lead(s) V1 V6  Assessment/Plan Principal Problem:   Hypernatremia Active Problems:   Weakness generalized   CKD (chronic kidney disease), stage III   Hypothyroidism   Dehydration   #1. Hypernatremia. Likely related to dehydration secondary to decreased oral intake since his recent stroke. And has moderate bilateral and moderate since the year or motor pharyngeal dysphasia requires assistance with feeds and has been refusing last several days. -Admit -Gentle IV fluids -Monitor  urine output -Serum osmolality -Urine sodium and urine osmolality -Recheck in the morning  #2. Generalized weakness. Patient with left-sided hemiparesis since his stroke 3 weeks ago in the setting of decreased oral intake. -Palliative care consult  #3. Dehydration. Related to above. -Gentle IV fluids -Recheck in the morning  #4. Chronic kidney disease. Creatinine 1.9. This is fairly close to her baseline. -Hold nephrotoxins -Gentle IV fluids -Monitor urine output -Recheck in the morning  #5. Recent stroke. Lacunar infarct 12/12/16. Discharged with plavix. Left hemiparesis and dysphagia. Dysphagia 2 diet with nectar thick liquids recommended at discharge -PT -repeat swallow eval -aspiration precautions -palliative care consult    DVT prophylaxis: heparin Code Status:  dnr Family Communication: daughters at bedside  Disposition Plan: facility and/or hospice Consults called: none Admission status: obs    Gwenyth BenderBLACK,Anastasiya Gowin M MD [facility]  If 7PM-7AM, please contact  night-coverage [email or web address] Password [email or web address]  12/31/2016, 4:31 PM

## 2016-12-31 NOTE — ED Provider Notes (Addendum)
[facility]-EMERGENCY DEPT Provider Note   CSN: [id number] Arrival date & time: 12/31/16  0932     History   Chief Complaint Chief Complaint  Patient presents with  . Fatigue    Level V caveat: Dementia/altered mental status   HPI Nilda RiggsCraig Sozio is a 83 y.o. male.  HPI Patient is a 83 year old male with a history of dementia and recent lacunar stroke who has been rehabilitating at a rehabilitation center.  He has some degree of oropharyngeal dysfunction.  He was okay recently to swallow foods and upright position.  Family brings in the ER today for increasing generalized weakness and decreased oral intake over the past 4 days.  He does have a history of urinary tract infections as well.  No reports of vomiting or diarrhea.  No reports of fevers.  Patient motions to his right side of his abdomen when asked if he is having discomfort anywhere.  He has dementia and can provide limited history.   Past Medical History:  Diagnosis Date  . Acute metabolic encephalopathy 11/2016   Likely due to UTI  . Arthritis   . Dementia   . GERD (gastroesophageal reflux disease)   . History of lacunar cerebrovascular accident (CVA) 11/2012   Acute, no tPA given  . Hypertension   . Hypothyroidism   . Stroke [facility](HCC) in age 83's 11- 80's  . UTI (urinary tract infection) 12/10/2016    Patient Active Problem List   Diagnosis Date Noted  . Acute lacunar CVA (cerebrovascular accident)  12/14/2016  . TIA (transient ischemic attack) 12/12/2016  . E coli bacteremia 12/11/2016  . UTI (urinary tract infection) 12/10/2016  . Weakness generalized 08/25/2013  . FTT (failure to thrive) in adult 08/25/2013  . ARF (acute renal failure) (HCC) 08/25/2013  . CKD (chronic kidney disease), stage III 08/25/2013  . Cough 08/25/2013  . Hypothyroidism 08/25/2013    Past Surgical History:  Procedure Laterality Date  . CATARACT EXTRACTION Bilateral        Home Medications    Prior to Admission medications     Medication Sig Start Date End Date Taking? Authorizing Provider  albuterol (PROVENTIL) (2.5 MG/3ML) 0.083% nebulizer solution Take 2.5 mg by nebulization every 4 (four) hours as needed for wheezing or shortness of breath.   Yes Historical Provider, MD  atorvastatin (LIPITOR) 20 MG tablet Take 1 tablet (20 mg total) by mouth daily at 6 PM. 12/15/16  Yes Clanford L Johnson, MD  cefTRIAXone (ROCEPHIN) 1 g injection Inject 1 g into the muscle daily. For 7 days 12/28/16 01/03/17 Yes Historical Provider, MD  clopidogrel (PLAVIX) 75 MG tablet Take 1 tablet (75 mg total) by mouth daily. 12/16/16  Yes Clanford Cyndie MullL Johnson, MD  dextromethorphan-guaiFENesin [facility](MUCINEX DM) 30-600 MG 12hr tablet Take 1 tablet by mouth 2 (two) times daily.    Yes Historical Provider, MD  levothyroxine (SYNTHROID, LEVOTHROID) 75 MCG tablet Take 75 mcg by mouth daily before breakfast.    Yes Historical Provider, MD  metoprolol tartrate (LOPRESSOR) 25 MG tablet Take 25 mg by mouth 2 (two) times daily.   Yes Historical Provider, MD  Nutritional Supplements (NUTRITIONAL SUPPLEMENT PO) Take 1 each by mouth 2 (two) times daily. Magic Cup   Yes Historical Provider, MD  nystatin (MYCOSTATIN) 100000 UNIT/ML suspension Take 5 mLs by mouth 4 (four) times daily. Swab on tongue and swallow 12/29/16 01/07/17 Yes Historical Provider, MD  omega-3 acid ethyl esters (LOVAZA) 1 G capsule Take 1 g by mouth daily.    Yes  Historical Provider, MD  omeprazole (PRILOSEC) 40 MG capsule Take 40 mg by mouth daily.    Yes Historical Provider, MD  saccharomyces boulardii (FLORASTOR) 250 MG capsule Take 250 mg by mouth 2 (two) times daily. For 10 days 12/28/16 01/06/17 Yes Historical Provider, MD  tamsulosin (FLOMAX) 0.4 MG CAPS capsule Take 0.4 mg by mouth daily after supper.    Yes Historical Provider, MD  UNABLE TO FIND Med Name: Med pass 120 mL by mouth daily    Yes Historical Provider, MD  acetaminophen (TYLENOL) 325 MG tablet Take 650 mg by mouth every 6 (six) hours as  needed for mild pain.     Historical Provider, MD    Family History No family history on file.  Social History Social History  Substance Use Topics  . Smoking status: Never Smoker  . Smokeless tobacco: Never Used  . Alcohol use No     Allergies   Patient has no known allergies.   Review of Systems Review of Systems  Unable to perform ROS: Dementia     Physical Exam Updated Vital Signs BP (!) 149/91   Pulse 88   Temp 97.5 F (36.4 C) (Rectal)   Resp 20   SpO2 96%   Physical Exam  Constitutional: He appears well-developed.  HENT:  Head: Normocephalic and atraumatic.  Eyes: EOM are normal.  Neck: Normal range of motion.  Cardiovascular: Normal rate, regular rhythm and intact distal pulses.   Pulmonary/Chest: Breath sounds normal.  Tachypnea  Abdominal: Soft. He exhibits no distension.  Mild right-sided tenderness  Musculoskeletal: Normal range of motion.  Neurological: He is alert.  Answers very simple questions with a head nod.  Nonverbal  Skin: Skin is warm and dry.  Nursing note and vitals reviewed.    ED Treatments / Results  Labs (all labs ordered are listed, but only abnormal results are displayed) Labs Reviewed  CBC WITH DIFFERENTIAL/PLATELET - Abnormal; Notable for the following:       Result Value   WBC 14.4 (*)    RBC 4.00 (*)    Hemoglobin 11.7 (*)    HCT 38.5 (*)    Neutro Abs 11.1 (*)    All other components within normal limits  COMPREHENSIVE METABOLIC PANEL - Abnormal; Notable for the following:    Sodium 152 (*)    Chloride 120 (*)    CO2 21 (*)    Glucose, Bld 136 (*)    BUN 52 (*)    Creatinine, Ser 1.90 (*)    Calcium 8.6 (*)    Albumin 2.3 (*)    AST 80 (*)    ALT 135 (*)    GFR calc non Af Amer 28 (*)    GFR calc Af Amer 33 (*)    All other components within normal limits  URINALYSIS, ROUTINE W REFLEX MICROSCOPIC - Abnormal; Notable for the following:    Hgb urine dipstick MODERATE (*)    Protein, ur 30 (*)     Bacteria, UA RARE (*)    Squamous Epithelial / LPF 0-5 (*)    All other components within normal limits  CULTURE, BLOOD (ROUTINE X 2)  CULTURE, BLOOD (ROUTINE X 2)  LIPASE, BLOOD    EKG  EKG Interpretation  Date/Time:  Thursday Dec 31 2016 10:09:50 EDT Ventricular Rate:  97 PR Interval:    QRS Duration: 96 QT Interval:  369 QTC Calculation: 469 R Axis:   77 Text Interpretation:  Sinus tachycardia Multiple ventricular premature complexes Anteroseptal infarct,  old Baseline wander in lead(s) V1 V6 No significant change was found Confirmed by Adeola Dennen  MD, Caryn Bee ([id number]) on 12/31/2016 1:21:16 PM       Radiology Dg Chest 1 View  Result Date: 12/31/2016 CLINICAL DATA:  Abdominal pain.  Failure to thrive EXAM: CHEST 1 VIEW COMPARISON:  12/11/2016 FINDINGS: Chronic mild cardiomegaly. Aortic tortuosity, stable from 12/10/2016. There is no edema, consolidation, effusion, or pneumothorax. Spondylosis. No acute osseous finding. IMPRESSION: No acute finding. Mild cardiomegaly. Electronically Signed   By: Marnee Spring M.D.   On: 12/31/2016 11:08   Ct Abdomen Pelvis W Contrast  Result Date: 12/31/2016 CLINICAL DATA:  Right-sided abdominal pain. EXAM: CT ABDOMEN AND PELVIS WITH CONTRAST TECHNIQUE: Multidetector CT imaging of the abdomen and pelvis was performed using the standard protocol following bolus administration of intravenous contrast. CONTRAST:  ISOVUE-300 IOPAMIDOL (ISOVUE-300) INJECTION 61% COMPARISON:  CT scan of November 09, 2008. FINDINGS: Lower chest: Minimal right posterior basilar subsegmental atelectasis is noted. Hepatobiliary: No focal liver abnormality is seen. No gallstones, gallbladder wall thickening, or biliary dilatation. Pancreas: Unremarkable. No pancreatic ductal dilatation or surrounding inflammatory changes. Spleen: Normal in size without focal abnormality. Adrenals/Urinary Tract: Adrenal glands are unremarkable. Bilateral renal cysts are noted. No hydronephrosis or renal  obstruction is noted. No renal or ureteral calculi are noted. Urinary bladder is unremarkable. Stomach/Bowel: Stomach is within normal limits. Appendix appears normal. No evidence of bowel wall thickening, distention, or inflammatory changes. Sigmoid diverticulosis is noted. Vascular/Lymphatic: Aortic atherosclerosis. No enlarged abdominal or pelvic lymph nodes. Reproductive: Prostate is unremarkable. Other: No abdominal wall hernia or abnormality. No abdominopelvic ascites. Musculoskeletal: No acute or significant osseous findings. IMPRESSION: Aortic atherosclerosis. Bilateral renal cysts. No acute abnormality seen in the abdomen or pelvis. Electronically Signed   By: Lupita Raider, M.D.   On: 12/31/2016 13:24    Procedures Procedures (including critical care time)   Medications Ordered in ED Medications  sodium chloride 0.9 % bolus 2,000 mL (2,000 mLs Intravenous New Bag/Given 12/31/16 1033)  morphine 4 MG/ML injection 4 mg (4 mg Intravenous Given 12/31/16 1030)  iopamidol (ISOVUE-300) 61 % injection (100 mLs  Contrast Given 12/31/16 1249)     Initial Impression / Assessment and Plan / ED Course  I have reviewed the triage vital signs and the nursing notes.  Pertinent labs & imaging results that were available during my care of the patient were reviewed by me and considered in my medical decision making (see chart for details).     Dehydration.  Adult failure to thrive.  I long discussion with the patient's family regarding goals of care.  This will need to be worked up further.  Palliative care consult ordered.  I suspect that he is declining secondary to his stroke and his dysphagia.   He is dehydrated. he will be admitted for ongoing IV fluids and palliative care consultation and family meeting   Final Clinical Impressions(s) / ED Diagnoses   Final diagnoses:  Dehydration  Renal insufficiency  Hypernatremia  Failure to thrive in adult    New Prescriptions New Prescriptions   No  medications on file     Azalia Bilis, MD 12/31/16 1413    Azalia Bilis, MD 12/31/16 1418

## 2016-12-31 NOTE — ED Triage Notes (Signed)
Pt presents to the ed with ems from [facility].  He had a stroke on April 14 and has been rapidly declining over the last few days.  He is weak in all extremities, and aphasic.  He has been able to eat and drink but recently cannot do that, family wanted him to be seen.

## 2016-12-31 NOTE — ED Notes (Signed)
Report given Air traffic controllerVicky RN

## 2016-12-31 NOTE — ED Notes (Signed)
Patient transported to X-ray 

## 2016-12-31 NOTE — ED Notes (Signed)
PA at bedside.

## 2016-12-31 NOTE — Progress Notes (Signed)
Bryce RiggsCraig Potter [id number] Admission Data: 12/31/2016 5:59 PM Attending Provider: Ozella Rocksavid J Merrell, MD  NWG:[id number]:Jennifer Manson PasseyBrown, NP Consults/ Treatment Team:   Bryce Potter is a 83 y.o. male patient admitted from ED awake, alert  & orientated  X 3,  DNR, VSS - Blood pressure 137/73, pulse 97, temperature 97.6 F (36.4 C), temperature source Oral, resp. rate 20, weight 84.5 kg (186 lb 4.6 oz), SpO2 98 %., , no c/o shortness of breath, no c/o chest pain, no distress noted.  IV site WDL:  forearm right, condition patent and no redness with a transparent dsg that's clean dry and intact.  Allergies:  No Known Allergies   Past Medical History:  Diagnosis Date  . Acute metabolic encephalopathy 11/2016   Likely due to UTI  . Arthritis   . Dementia   . GERD (gastroesophageal reflux disease)   . History of lacunar cerebrovascular accident (CVA) 11/2012   Acute, no tPA given  . Hypertension   . Hypothyroidism   . Stroke [facility](H[facility]) in age 83's 53- 80's  . UTI (urinary tract infection) 12/10/2016    History:  obtained from ED Nurse. Tobacco/alcohol: denied none  Pt orientation to unit, room and routine. Information packet given to patient/family and safety video watched.  Admission INP armband ID verified with patient/family, and in place. SR up x 2, fall risk assessment complete with Patient and family verbalizing understanding of risks associated with falls. Pt verbalizes an understanding of how to use the call bell and to call for help before getting out of bed.  Skin, clean-dry- intact without evidence of bruising, or skin tears.   No evidence of skin break down noted on exam. Pressure ulcers to right heel and coccyx area. Covered at admission with foam dressing from SNF     Will cont to monitor and assist as needed.  Camillo FlamingVicki L Mateo Overbeck, RN 12/31/2016 5:59 PM

## 2016-12-31 NOTE — ED Notes (Signed)
Got patient on the monitor into a gown patient is resting with family at bedside 

## 2016-12-31 NOTE — ED Notes (Signed)
Patient family given warm blanket.

## 2016-12-31 NOTE — Progress Notes (Signed)
CRITICAL VALUE ALERT  Critical value received:  Urine Osmolity  Date of notification:  12/31/16  Time of notification:  17:50  Critical value read back:Yes.    Nurse who received alert:  Midge AverVicki Darwin Guastella RN  MD notified (1st page):  Toya SmothersKaren Black, NP  Time of first page:  17:55

## 2017-01-01 DIAGNOSIS — N183 Chronic kidney disease, stage 3 (moderate): Secondary | ICD-10-CM | POA: Diagnosis not present

## 2017-01-01 DIAGNOSIS — I639 Cerebral infarction, unspecified: Secondary | ICD-10-CM | POA: Diagnosis not present

## 2017-01-01 DIAGNOSIS — R1032 Left lower quadrant pain: Secondary | ICD-10-CM

## 2017-01-01 DIAGNOSIS — R638 Other symptoms and signs concerning food and fluid intake: Secondary | ICD-10-CM | POA: Diagnosis present

## 2017-01-01 DIAGNOSIS — L89153 Pressure ulcer of sacral region, stage 3: Secondary | ICD-10-CM | POA: Diagnosis present

## 2017-01-01 DIAGNOSIS — Z87440 Personal history of urinary (tract) infections: Secondary | ICD-10-CM | POA: Diagnosis not present

## 2017-01-01 DIAGNOSIS — R6251 Failure to thrive (child): Secondary | ICD-10-CM | POA: Diagnosis present

## 2017-01-01 DIAGNOSIS — G9340 Encephalopathy, unspecified: Secondary | ICD-10-CM | POA: Diagnosis not present

## 2017-01-01 DIAGNOSIS — E860 Dehydration: Secondary | ICD-10-CM | POA: Diagnosis not present

## 2017-01-01 DIAGNOSIS — Z515 Encounter for palliative care: Secondary | ICD-10-CM | POA: Diagnosis not present

## 2017-01-01 DIAGNOSIS — R1312 Dysphagia, oropharyngeal phase: Secondary | ICD-10-CM

## 2017-01-01 DIAGNOSIS — R627 Adult failure to thrive: Secondary | ICD-10-CM | POA: Diagnosis not present

## 2017-01-01 DIAGNOSIS — G464 Cerebellar stroke syndrome: Secondary | ICD-10-CM | POA: Diagnosis not present

## 2017-01-01 DIAGNOSIS — R109 Unspecified abdominal pain: Secondary | ICD-10-CM | POA: Diagnosis present

## 2017-01-01 DIAGNOSIS — M1990 Unspecified osteoarthritis, unspecified site: Secondary | ICD-10-CM | POA: Diagnosis present

## 2017-01-01 DIAGNOSIS — F0390 Unspecified dementia without behavioral disturbance: Secondary | ICD-10-CM | POA: Diagnosis present

## 2017-01-01 DIAGNOSIS — F0150 Vascular dementia without behavioral disturbance: Secondary | ICD-10-CM | POA: Diagnosis not present

## 2017-01-01 DIAGNOSIS — I679 Cerebrovascular disease, unspecified: Secondary | ICD-10-CM | POA: Diagnosis not present

## 2017-01-01 DIAGNOSIS — Z79899 Other long term (current) drug therapy: Secondary | ICD-10-CM | POA: Diagnosis not present

## 2017-01-01 DIAGNOSIS — G8194 Hemiplegia, unspecified affecting left nondominant side: Secondary | ICD-10-CM | POA: Diagnosis present

## 2017-01-01 DIAGNOSIS — I10 Essential (primary) hypertension: Secondary | ICD-10-CM | POA: Diagnosis not present

## 2017-01-01 DIAGNOSIS — I69391 Dysphagia following cerebral infarction: Secondary | ICD-10-CM | POA: Diagnosis not present

## 2017-01-01 DIAGNOSIS — I129 Hypertensive chronic kidney disease with stage 1 through stage 4 chronic kidney disease, or unspecified chronic kidney disease: Secondary | ICD-10-CM | POA: Diagnosis present

## 2017-01-01 DIAGNOSIS — R1310 Dysphagia, unspecified: Secondary | ICD-10-CM | POA: Diagnosis not present

## 2017-01-01 DIAGNOSIS — K219 Gastro-esophageal reflux disease without esophagitis: Secondary | ICD-10-CM | POA: Diagnosis present

## 2017-01-01 DIAGNOSIS — E039 Hypothyroidism, unspecified: Secondary | ICD-10-CM | POA: Diagnosis present

## 2017-01-01 DIAGNOSIS — E870 Hyperosmolality and hypernatremia: Secondary | ICD-10-CM | POA: Diagnosis not present

## 2017-01-01 DIAGNOSIS — Z66 Do not resuscitate: Secondary | ICD-10-CM | POA: Diagnosis present

## 2017-01-01 DIAGNOSIS — Z7902 Long term (current) use of antithrombotics/antiplatelets: Secondary | ICD-10-CM | POA: Diagnosis not present

## 2017-01-01 LAB — BASIC METABOLIC PANEL
Anion gap: 10 (ref 5–15)
BUN: 41 mg/dL — AB (ref 6–20)
CHLORIDE: 123 mmol/L — AB (ref 101–111)
CO2: 20 mmol/L — AB (ref 22–32)
CREATININE: 1.43 mg/dL — AB (ref 0.61–1.24)
Calcium: 8 mg/dL — ABNORMAL LOW (ref 8.9–10.3)
GFR calc non Af Amer: 40 mL/min — ABNORMAL LOW (ref >60)
GFR, EST AFRICAN AMERICAN: 46 mL/min — AB (ref >60)
Glucose, Bld: 103 mg/dL — ABNORMAL HIGH (ref 65–99)
Potassium: 4.4 mmol/L (ref 3.5–5.1)
Sodium: 153 mmol/L — ABNORMAL HIGH (ref 135–145)

## 2017-01-01 LAB — CBC
HCT: 35 % — ABNORMAL LOW (ref 39.0–52.0)
Hemoglobin: 10.3 g/dL — ABNORMAL LOW (ref 13.0–17.0)
MCH: 28.5 pg (ref 26.0–34.0)
MCHC: 29.4 g/dL — ABNORMAL LOW (ref 30.0–36.0)
MCV: 97 fL (ref 78.0–100.0)
PLATELETS: 157 10*3/uL (ref 150–400)
RBC: 3.61 MIL/uL — AB (ref 4.22–5.81)
RDW: 14.5 % (ref 11.5–15.5)
WBC: 12.6 10*3/uL — ABNORMAL HIGH (ref 4.0–10.5)

## 2017-01-01 MED ORDER — HYDROCERIN EX CREA
TOPICAL_CREAM | Freq: Two times a day (BID) | CUTANEOUS | Status: DC
  Administered 2017-01-01: 23:00:00 via TOPICAL
  Filled 2017-01-01 (×2): qty 113

## 2017-01-01 MED ORDER — MORPHINE SULFATE (PF) 4 MG/ML IV SOLN
1.0000 mg | INTRAVENOUS | Status: DC | PRN
Start: 2017-01-01 — End: 2017-01-02

## 2017-01-01 MED ORDER — SODIUM CHLORIDE 0.45 % IV SOLN
INTRAVENOUS | Status: DC
  Administered 2017-01-01: 09:00:00 via INTRAVENOUS

## 2017-01-01 MED ORDER — CLOPIDOGREL BISULFATE 75 MG PO TABS
75.0000 mg | ORAL_TABLET | Freq: Every day | ORAL | Status: DC
  Administered 2017-01-02: 75 mg via ORAL
  Filled 2017-01-01: qty 1

## 2017-01-01 MED ORDER — GLYCOPYRROLATE 0.2 MG/ML IJ SOLN: 0.2000 mg | INTRAMUSCULAR | Status: DC | PRN

## 2017-01-01 MED ORDER — BISACODYL 10 MG RE SUPP: 10.0000 mg | RECTAL | Status: DC

## 2017-01-01 NOTE — Progress Notes (Signed)
[facility]: RN  Received referral from Eduard RouxSarah Bullard, PMT, who advised that she had offered choice to patient/family.  Referral for [facility].  No beds available to offer at this time.  Gaylord ShihAdvised Sarah, PMT of same.   We will continue to monitor for bed availability.  Thank you,  Adele BarthelAmy Evans, RN, BSN [facility] Liaison [phone]  All hospital liaisons are now on AMION.

## 2017-01-01 NOTE — Progress Notes (Addendum)
Update 5:10pm: [facility] has a bed available on Saturday for patient to discharge. MD updated.   3pm: CSW spoke with patient's daughter. CSW alerted her that [facility] full. Her next choice is [facility] [place].  CSW continuing to follow for placement.   Osborne Cascoadia Leslea Vowles LCSWA [phone]

## 2017-01-01 NOTE — Evaluation (Signed)
Clinical/Bedside Swallow Evaluation Patient Details  Name: Bryce Potter MRN: [id number] Date of Birth: [birth year]  Today's Date: 01/01/2017 Time: SLP Start Time (ACUTE ONLY): [id number] SLP Stop Time (ACUTE ONLY): 0925 SLP Time Calculation (min) (ACUTE ONLY): 33 min  Past Medical History:  Past Medical History:  Diagnosis Date  . Acute metabolic encephalopathy 11/2016   Likely due to UTI  . Arthritis   . Dementia   . GERD (gastroesophageal reflux disease)   . History of lacunar cerebrovascular accident (CVA) 11/2012   Acute, no tPA given  . Hypertension   . Hypothyroidism   . Stroke [facility]) in age 83's 63- 80's  . UTI (urinary tract infection) 12/10/2016   Past Surgical History:  Past Surgical History:  Procedure Laterality Date  . CATARACT EXTRACTION Bilateral    HPI:  Bryce MiloCraig Pankeyis a 83 y.o.malewith medical history significant for recent stroke, GERD, chronic kidney disease, dehydration, Escherichia coli bacteremia, hypothyroidism presents to the emergency department from the facility with the chief complaint of generalized weakness decreased oral intake. Per MD note initial evaluation reveals hypernatremia failure to thrive. CXR No acute finding. MBS 12/14/16 recommended Dys 2, nectar thick liquids- significant oral dysphagia with holding, delays, silent vocal cord penetration.    Assessment / Plan / Recommendation Clinical Impression  Pt familiar to this therapist from prior visit and unfortunatley swallow function continues to decline. He is weak, deconditioned with weak and ineffecitve volitional cough. He required aggressive oral care to remove moist white diffuse secretions. Suspect laryngeal penetration and/or aspiration with all including thin, nectar and puree. Oral holding with delayed transit, labial leakage, wet vocal quality, decreased laryngeal elevation on palpation, multiple swallows and cough during oral care. Palliative care consult would be beneficial to determine  pt/families goals for po's. Recommend continue thorough oral care and this therapist okay for ice chips or small sips of water. Could do essential meds crushed if needed. Will follow up.      SLP Visit Diagnosis: Dysphagia, oropharyngeal phase (R13.12)    Aspiration Risk  Severe aspiration risk    Diet Recommendation Ice chips PRN after oral care;Free water protocol after oral care   Medication Administration: Crushed with puree    Other  Recommendations Oral Care Recommendations: Oral care QID   Follow up Recommendations Skilled Nursing facility      Frequency and Duration min 2x/week  2 weeks       Prognosis Prognosis for Safe Diet Advancement: Fair Barriers to Reach Goals: Cognitive deficits      Swallow Study   General HPI: Bryce MiloCraig Pankeyis a 83 y.o.malewith medical history significant for recent stroke, GERD, chronic kidney disease, dehydration, Escherichia coli bacteremia, hypothyroidism presents to the emergency department from the facility with the chief complaint of generalized weakness decreased oral intake. Per MD note initial evaluation reveals hypernatremia failure to thrive. CXR No acute finding. MBS 12/14/16 recommended Dys 2, nectar thick liquids- significant oral dysphagia with holding, delays, silent vocal cord penetration.  Type of Study: Bedside Swallow Evaluation Previous Swallow Assessment: bedside swallow eval 12/12/16 Diet Prior to this Study: NPO Temperature Spikes Noted: No Respiratory Status: Room air History of Recent Intubation: No Behavior/Cognition: Alert;Cooperative;Requires cueing Oral Cavity Assessment: Dried secretions;Dry Oral Care Completed by SLP: Yes Oral Cavity - Dentition: Edentulous Vision: Functional for self-feeding Self-Feeding Abilities: Needs assist;Needs set up Patient Positioning: Upright in bed Baseline Vocal Quality: Low vocal intensity Volitional Cough: Weak Volitional Swallow: Unable to elicit    Oral/Motor/Sensory  Function Overall Oral Motor/Sensory Function: Generalized  oral weakness   Ice Chips Ice chips: Not tested   Thin Liquid Thin Liquid: Impaired Presentation: Cup Oral Phase Impairments: Reduced labial seal Oral Phase Functional Implications: Right anterior spillage;Left anterior spillage;Oral holding;Prolonged oral transit Pharyngeal  Phase Impairments: Suspected delayed Swallow;Multiple swallows;Wet Vocal Quality    Nectar Thick Nectar Thick Liquid: Impaired Presentation: Spoon;Cup Oral Phase Impairments: Reduced labial seal Oral phase functional implications: Right anterior spillage;Left anterior spillage;Prolonged oral transit;Oral holding Pharyngeal Phase Impairments: Suspected delayed Swallow;Multiple swallows;Wet Vocal Quality   Honey Thick Honey Thick Liquid: Not tested   Puree Puree: Impaired Presentation: Spoon Oral Phase Impairments: Reduced labial seal Oral Phase Functional Implications: Right anterior spillage;Oral holding;Prolonged oral transit Pharyngeal Phase Impairments: Suspected delayed Swallow;Multiple swallows   Solid   GO   Solid: Not tested    Functional Assessment Tool Used: skilled clinical observation Functional Limitations: Swallowing Swallow Current Status (Z6109): At least 80 percent but less than 100 percent impaired, limited or restricted Swallow Goal Status [phone]): At least 60 percent but less than 80 percent impaired, limited or restricted   Royce Macadamia 01/01/2017,10:00 AM  Breck Coons Lonell Face.Ed [employer] [phone]

## 2017-01-01 NOTE — Consult Note (Signed)
Consultation Note Date: 01/01/2017   Patient Name: Bryce Potter  DOB: October 19, 1918  MRN: [id number]  Age / Sex: 83 y.o., male  PCP: Eloise Levels, NP Referring Physician: Domenic Polite, MD  Reason for Consultation: Establishing goals of care, Hospice Evaluation, Inpatient hospice referral and Psychosocial/spiritual support  HPI/Patient Profile: 83 y.o. male  with past medical history of Dementia, chronic kidney disease stage III, dehydration, bacteremia, hypothyroidism, recent stroke 12/10/2016 admitted on 12/31/2016 with altered mental status, weakness, dehydration and hypernatremia. Patient has been bed bound since his stroke in April. He was diagnosed with dysphagia at that time and a dysphagia 2 diet was recommended.  Clinical Assessment and Goals of Care: Met with patient's daughter, Luellen Pucker. Patient prior to having his stroke in early April, lived with Luellen Pucker and was ambulatory and was able to take care of most of his daily needs. Since the stroke he has been bed bound, speech dysarthric. He has been eating less. He has copious secretions despite being NPO. He is moaning and had verbalized abdominal discomfort but thus far workup has been nonspecific  Gurnoor Ursua, daughter [phone]. Patient has 3 other children who have been involved in his care. [employer] is his healthcare power of attorney    SUMMARY OF RECOMMENDATIONS   DO NOT RESUSCITATE DO NOT INTUBATE No PEG tube. Allow patient to eat for comfort. Healthcare power of attorney is aware of increased risk of aspiration, aspiration pneumonia which could lead to end-of-life. She wishes him to eat whatever he would like at this point in his life Daughter is hopeful for inpatient hospice, [employer] [facility]. Patient's wife of 58 years passed away at the facility. Offered choice of hospice, order placed to social work, called  [facility] liaison Code Status/Advance Care Planning:  DNR    Symptom Management:   Secretions: Would recommend decreasing IV fluids to [facility]. Oral suction only. Will add Robinul IV as needed  Pain: Patient is moaning and has been verbalizing abdominal pain will add morphine IV as needed  Palliative Prophylaxis:   Aspiration, Bowel Regimen, Delirium Protocol, Frequent Pain Assessment, Oral Care and Turn Reposition  Additional Recommendations (Limitations, Scope, Preferences):  Full Comfort Care  Psycho-social/Spiritual:   Desire for further Chaplaincy support:no  Additional Recommendations: Grief/Bereavement Support  Prognosis:   < 4 weeks in the setting of severe aspiration, silent vocal cord penetration, copious upper airway secretions despite being nothing by mouth, in the setting of dementia with new CVA now bedridden. Patient is high risk for aspiration pneumonia, sepsis, acute decline  Discharge Planning: Hospice facility      Primary Diagnoses: Present on Admission: . CKD (chronic kidney disease), stage III . Hypothyroidism . Hypernatremia . Dehydration   I have reviewed the medical record, interviewed the patient and family, and examined the patient. The following aspects are pertinent.  Past Medical History:  Diagnosis Date  . Acute metabolic encephalopathy 58/8502   Likely due to UTI  . Arthritis   . Dementia   . GERD (gastroesophageal  reflux disease)   . History of lacunar cerebrovascular accident (CVA) 11/2012   Acute, no tPA given  . Hypertension   . Hypothyroidism   . Stroke [facility]) in age 51's - 54's  . UTI (urinary tract infection) 12/10/2016   Social History   Social History  . Marital status: Single    Spouse name: N/A  . Number of children: N/A  . Years of education: N/A   Social History Main Topics  . Smoking status: Never Smoker  . Smokeless tobacco: Never Used  . Alcohol use No  . Drug use: No  . Sexual activity: Not  Asked   Other Topics Concern  . None   Social History Narrative  . None   Family History  Problem Relation Age of Onset  . Family history unknown: Yes   Scheduled Meds: . atorvastatin  20 mg Oral q1800  . clopidogrel  75 mg Oral Daily  . dextromethorphan-guaiFENesin  1 tablet Oral BID  . heparin  5,000 Units Subcutaneous Q8H  . hydrocerin   Topical BID  . levothyroxine  37.5 mcg Intravenous Daily  . metoprolol  5 mg Intravenous Q6H  . nystatin  5 mL Oral QID   Continuous Infusions: . sodium chloride 10 mL/hr at 01/01/17 1152   PRN Meds:.acetaminophen **OR** acetaminophen, albuterol, ondansetron **OR** ondansetron (ZOFRAN) IV Medications Prior to Admission:  Prior to Admission medications   Medication Sig Start Date End Date Taking? Authorizing Provider  albuterol (PROVENTIL) (2.5 MG/3ML) 0.083% nebulizer solution Take 2.5 mg by nebulization every 4 (four) hours as needed for wheezing or shortness of breath.   Yes Historical Provider, MD  atorvastatin (LIPITOR) 20 MG tablet Take 1 tablet (20 mg total) by mouth daily at 6 PM. 12/15/16  Yes Clanford L Johnson, MD  cefTRIAXone (ROCEPHIN) 1 g injection Inject 1 g into the muscle daily. For 7 days 12/28/16 01/03/17 Yes Historical Provider, MD  clopidogrel (PLAVIX) 75 MG tablet Take 1 tablet (75 mg total) by mouth daily. 12/16/16  Yes Clanford Marisa Hua, MD  dextromethorphan-guaiFENesin [facility] DM) 30-600 MG 12hr tablet Take 1 tablet by mouth 2 (two) times daily.    Yes Historical Provider, MD  levothyroxine (SYNTHROID, LEVOTHROID) 75 MCG tablet Take 75 mcg by mouth daily before breakfast.    Yes Historical Provider, MD  metoprolol tartrate (LOPRESSOR) 25 MG tablet Take 25 mg by mouth 2 (two) times daily.   Yes Historical Provider, MD  Nutritional Supplements (NUTRITIONAL SUPPLEMENT PO) Take 1 each by mouth 2 (two) times daily. Magic Cup   Yes Historical Provider, MD  nystatin (MYCOSTATIN) 100000 UNIT/ML suspension Take 5 mLs by mouth 4  (four) times daily. Swab on tongue and swallow 12/29/16 01/07/17 Yes Historical Provider, MD  omega-3 acid ethyl esters (LOVAZA) 1 G capsule Take 1 g by mouth daily.    Yes Historical Provider, MD  omeprazole (PRILOSEC) 40 MG capsule Take 40 mg by mouth daily.    Yes Historical Provider, MD  saccharomyces boulardii (FLORASTOR) 250 MG capsule Take 250 mg by mouth 2 (two) times daily. For 10 days 12/28/16 01/06/17 Yes Historical Provider, MD  tamsulosin (FLOMAX) 0.4 MG CAPS capsule Take 0.4 mg by mouth daily after supper.    Yes Historical Provider, MD  UNABLE TO FIND Med Name: Med pass 120 mL by mouth daily    Yes Historical Provider, MD  acetaminophen (TYLENOL) 325 MG tablet Take 650 mg by mouth every 6 (six) hours as needed for mild pain.  Historical Provider, MD   No Known Allergies Review of Systems  Unable to perform ROS: Dementia    Physical Exam  Constitutional: He appears well-developed.  Cardiovascular: Normal rate and regular rhythm.   Pulmonary/Chest: Effort normal.  Upper airway secretions despite being nothing by mouth  Neurological: He is alert.  Can answer a few yes and no questions  Skin: Skin is warm and dry.  Psychiatric:  He is moaning but unable to tell me whether he has pain or not No agitation  Nursing note and vitals reviewed.   Vital Signs: BP (!) 119/55 (BP Location: Right Arm)   Pulse 87   Temp 97.4 F (36.3 C) (Oral)   Resp 16   Ht 6\' 1"  (1.854 m)   Wt 84.5 kg (186 lb 4.6 oz)   SpO2 100%   BMI 24.58 kg/m  Pain Assessment: PAINAD   Pain Score: 0-No pain   SpO2: SpO2: 100 % O2 Device:SpO2: 100 % O2 Flow Rate: .   IO: Intake/output summary:  Intake/Output Summary (Last 24 hours) at 01/01/17 1359 Last data filed at 01/01/17 03/03/17  Gross per 24 hour  Intake           1007.5 ml  Output              650 ml  Net            357.5 ml    LBM: Last BM Date:  (PTA) Baseline Weight: Weight: 84.5 kg (186 lb 4.6 oz) Most recent weight: Weight: 84.5 kg  (186 lb 4.6 oz)     Palliative Assessment/Data:   Flowsheet Rows     Most Recent Value  Intake Tab  Referral Department  Hospitalist  Unit at Time of Referral  Med/Surg Unit  Palliative Care Primary Diagnosis  Sepsis/Infectious Disease  Date Notified  12/31/16  Palliative Care Type  New Palliative care  Reason for referral  Clarify Goals of Care, Psychosocial or Spiritual support, Counsel Regarding Hospice  Date of Admission  12/31/16  Date first seen by Palliative Care  01/01/17  # of days Palliative referral response time  1 Day(s)  # of days IP prior to Palliative referral  0  Clinical Assessment  Palliative Performance Scale Score  30%  Pain Max last 24 hours  Not able to report  Pain Min Last 24 hours  Not able to report  Dyspnea Max Last 24 Hours  Not able to report  Dyspnea Min Last 24 hours  Not able to report  Nausea Max Last 24 Hours  Not able to report  Nausea Min Last 24 Hours  Not able to report  Anxiety Max Last 24 Hours  Not able to report  Anxiety Min Last 24 Hours  Not able to report  Other Max Last 24 Hours  Not able to report  Psychosocial & Spiritual Assessment  Palliative Care Outcomes  Patient/Family meeting held?  Yes  Who was at the meeting?  daughter 03/03/17  Palliative Care Outcomes  Clarified goals of care, Transitioned to hospice, Changed to focus on comfort, Counseled regarding hospice  Patient/Family wishes: Interventions discontinued/not started   Mechanical Ventilation, Hemodialysis, Vasopressors, Trach, Tube feedings/TPN, PEG  Palliative Care follow-up planned  Yes, Facility      Time In: 1130 Time Out: 1250 Time Total: 80 min Greater than 50%  of this time was spent counseling and coordinating care related to the above assessment and plan.Staffed with Dr. Magda Paganini  Signed by: Jomarie Longs, NP  Please contact Palliative Medicine Team phone at [phone] for questions and concerns.  For individual provider: See  Shea Evans

## 2017-01-01 NOTE — Consult Note (Signed)
WOC Nurse wound consult note Reason for Consult: sacral (coccygeal) pressure injury and left heel pressure injury.  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 Wound type: Pressure, moisture Pressure Injury POA: Yes Measurement: Coccygeal Stage 3 Pressure Injury measures 7cm x 2.5cm x 0.2cm with red, moist wound bed.  Darker hue of red noted at the most proximal aspect of the wound which may indicate an area of impending depth. Small amount of serous exudate noted, but dark brown exudate (dried) noted on old dressing.   Left heel with deep tissue pressure injury measuring 4cm x 2cm.  Purple discoloration, intact skin.  No exudate. Dry skin on bilateral feet.  Patient with bilateral pressure redistribution heel boots. Scrotum with 2cm x 0.2cm x 0.1cm area of partial thickness tissue loss, pink dry wound bed. No exudate. Wound bed: As described above Drainage (amount, consistency, odor) As described above Periwound: Intact, dry. Dressing procedure/placement/frequency: I will provide a mattress replacement today and have provided Nursing with guidance for turning and repositioning to avoid the supine position.  Topical wound care orders have been provided for the left heel (white petrolatum) and coccygeal Stage 3 Pressure Injury (silver hydrofiber topped with sacral silicone foam, changed daily). The area of partial thickness tissue loss at the scrotum is likely cause by incontinence associated dermatitis, patient is wearing an external catheter at this time.  Guidance for Nursing is provided in the use of our house incontinence care products. WOC nursing team will not follow, but will remain available to this patient, the nursing and medical teams.  Please re-consult if needed. Thanks, Ladona MowLaurie Labaron Digirolamo, MSN, RN, GNP, Hans EdenCWOCN, CWON-AP, FAAN  Pager# [phone]

## 2017-01-01 NOTE — Progress Notes (Signed)
PROGRESS NOTE    Nicole Hafley  ZOX:[id number] DOB: 15-Jul-1919 DOA: 12/31/2016 PCP: Boneta Lucks, NP  Brief Narrative:Bryce Potter is a 83 y.o. male with medical history significant for recent stroke, chronic kidney disease, dehydration, Escherichia coli bacteremia, hypothyroidism presents to the emergency department from the facility with the chief complaint of generalized weakness decreased oral intake. Patient was discharged 16 days ago after being hospitalized for 3 days with a lacunar CVA and UTI. . Does report left side is "gone". He continues to have difficulty swallowing. Over the last 2 days he has not taken anything by mouth. He also reports a generalized weakness. He's been nonambulatory since the stroke   Assessment & Plan:  #1. Hypernatremia/dehydration - dehydration secondary to decreased oral intake since his recent stroke. -Na still high, s/p palliative meeting, plan for comfort based Rx and possibly residential Hospice  #2. Adult failure to thrive since recent CVA -Palliative care consulted, Hospice/comfort care planned  #3. Stage 3 sacral decub -wound care  #4. Chronic kidney disease. Creatinine 1.9. This is fairly close to her baseline. -comfort care, DC labs  #5. Recent stroke. Lacunar infarct 12/12/16. Discharged with plavix. Left hemiparesis and dysphagia. Dysphagia 2 diet with nectar thick liquids recommended at discharge -comfort feeds   DVT prophylaxis: heparin Code Status:  dnr Family Communication: None at bedside  Disposition Plan: Hospice    Consultants:   Palliative   Subjective: Feels ok, confused  Objective: Vitals:   12/31/16 1559 12/31/16 2042 01/01/17 0258 01/01/17 0536  BP: 137/73 124/64 (!) 120/57 (!) 115/57  Pulse: 97 79 84 80  Resp: 20 18  18   Temp: 97.6 F (36.4 C) 97.6 F (36.4 C)  98.4 F (36.9 C)  TempSrc: Oral     SpO2: 98% 96%  96%  Weight: 84.5 kg (186 lb 4.6 oz)     Height: 6\' 1"  (1.854 m)        Intake/Output Summary (Last 24 hours) at 01/01/17 1154 Last data filed at 01/01/17 0943  Gross per 24 hour  Intake           1007.5 ml  Output              650 ml  Net            357.5 ml   Filed Weights   12/31/16 1559  Weight: 84.5 kg (186 lb 4.6 oz)    Examination:  General exam: frail, elderly male, aphasic, no distress Respiratory system: ronchi at bases Cardiovascular system: S1 & S2 heard, RRR. No JVD, murmurs Gastrointestinal system: Abdomen is nondistended, soft and nontender.Normal bowel sounds heard. Central nervous system: alert, awake, aphasic, mild L sided weakness-chronic Extremities: no edema Skin: No rashes, lesions or ulcers Psychiatry: pleasant    Data Reviewed:   CBC:  Recent Labs Lab 12/31/16 1030 01/01/17 0410  WBC 14.4* 12.6*  NEUTROABS 11.1*  --   HGB 11.7* 10.3*  HCT 38.5* 35.0*  MCV 96.3 97.0  PLT PLATELET CLUMPS NOTED ON SMEAR, UNABLE TO ESTIMATE 157   Basic Metabolic Panel:  Recent Labs Lab 12/31/16 1030 12/31/16 1647 01/01/17 0410  NA 152*  --  153*  K 4.9  --  4.4  CL 120*  --  123*  CO2 21*  --  20*  GLUCOSE 136*  --  103*  BUN 52*  --  41*  CREATININE 1.90* 1.65* 1.43*  CALCIUM 8.6*  --  8.0*   GFR: Estimated Creatinine Clearance: 34.1 mL/min (A) (by C-G  formula based on SCr of 1.43 mg/dL (H)). Liver Function Tests:  Recent Labs Lab 12/31/16 1030  AST 80*  ALT 135*  ALKPHOS 83  BILITOT 0.7  PROT 7.7  ALBUMIN 2.3*    Recent Labs Lab 12/31/16 1030  LIPASE 44   No results for input(s): AMMONIA in the last 168 hours. Coagulation Profile: No results for input(s): INR, PROTIME in the last 168 hours. Cardiac Enzymes: No results for input(s): CKTOTAL, CKMB, CKMBINDEX, TROPONINI in the last 168 hours. BNP (last 3 results) No results for input(s): PROBNP in the last 8760 hours. HbA1C: No results for input(s): HGBA1C in the last 72 hours. CBG: No results for input(s): GLUCAP in the last 168 hours. Lipid  Profile: No results for input(s): CHOL, HDL, LDLCALC, TRIG, CHOLHDL, LDLDIRECT in the last 72 hours. Thyroid Function Tests: No results for input(s): TSH, T4TOTAL, FREET4, T3FREE, THYROIDAB in the last 72 hours. Anemia Panel: No results for input(s): VITAMINB12, FOLATE, FERRITIN, TIBC, IRON, RETICCTPCT in the last 72 hours. Urine analysis:    Component Value Date/Time   COLORURINE YELLOW 12/31/2016 1718   APPEARANCEUR CLEAR 12/31/2016 1718   LABSPEC 1.028 12/31/2016 1718   PHURINE 5.0 12/31/2016 1718   GLUCOSEU NEGATIVE 12/31/2016 1718   HGBUR LARGE (A) 12/31/2016 1718   BILIRUBINUR NEGATIVE 12/31/2016 1718   KETONESUR NEGATIVE 12/31/2016 1718   PROTEINUR NEGATIVE 12/31/2016 1718   UROBILINOGEN 1.0 08/25/2013 1427   NITRITE NEGATIVE 12/31/2016 1718   LEUKOCYTESUR NEGATIVE 12/31/2016 1718   Sepsis Labs: @LABRCNTIP (procalcitonin:4,lacticidven:4)  ) Recent Results (from the past 240 hour(s))  MRSA PCR Screening     Status: None   Collection Time: 12/31/16  5:18 PM  Result Value Ref Range Status   MRSA by PCR NEGATIVE NEGATIVE Final    Comment:        The GeneXpert MRSA Assay (FDA approved for NASAL specimens only), is one component of a comprehensive MRSA colonization surveillance program. It is not intended to diagnose MRSA infection nor to guide or monitor treatment for MRSA infections.          Radiology Studies: Dg Chest 1 View  Result Date: 12/31/2016 CLINICAL DATA:  Abdominal pain.  Failure to thrive EXAM: CHEST 1 VIEW COMPARISON:  12/11/2016 FINDINGS: Chronic mild cardiomegaly. Aortic tortuosity, stable from 12/10/2016. There is no edema, consolidation, effusion, or pneumothorax. Spondylosis. No acute osseous finding. IMPRESSION: No acute finding. Mild cardiomegaly. Electronically Signed   By: Marnee SpringJonathon  Watts M.D.   On: 12/31/2016 11:08   Ct Abdomen Pelvis W Contrast  Result Date: 12/31/2016 CLINICAL DATA:  Right-sided abdominal pain. EXAM: CT ABDOMEN AND  PELVIS WITH CONTRAST TECHNIQUE: Multidetector CT imaging of the abdomen and pelvis was performed using the standard protocol following bolus administration of intravenous contrast. CONTRAST:  100mL ISOVUE-300 IOPAMIDOL (ISOVUE-300) INJECTION 61% COMPARISON:  CT scan of November 09, 2008. FINDINGS: Lower chest: Minimal right posterior basilar subsegmental atelectasis is noted. Hepatobiliary: No focal liver abnormality is seen. No gallstones, gallbladder wall thickening, or biliary dilatation. Pancreas: Unremarkable. No pancreatic ductal dilatation or surrounding inflammatory changes. Spleen: Normal in size without focal abnormality. Adrenals/Urinary Tract: Adrenal glands are unremarkable. Bilateral renal cysts are noted. No hydronephrosis or renal obstruction is noted. No renal or ureteral calculi are noted. Urinary bladder is unremarkable. Stomach/Bowel: Stomach is within normal limits. Appendix appears normal. No evidence of bowel wall thickening, distention, or inflammatory changes. Sigmoid diverticulosis is noted. Vascular/Lymphatic: Aortic atherosclerosis. No enlarged abdominal or pelvic lymph nodes. Reproductive: Prostate is unremarkable. Other: No abdominal  wall hernia or abnormality. No abdominopelvic ascites. Musculoskeletal: No acute or significant osseous findings. IMPRESSION: Aortic atherosclerosis. Bilateral renal cysts. No acute abnormality seen in the abdomen or pelvis. Electronically Signed   By: Lupita Raider, M.D.   On: 12/31/2016 13:24        Scheduled Meds: . atorvastatin  20 mg Oral q1800  . clopidogrel  75 mg Oral Daily  . dextromethorphan-guaiFENesin  1 tablet Oral BID  . heparin  5,000 Units Subcutaneous Q8H  . hydrocerin   Topical BID  . levothyroxine  37.5 mcg Intravenous Daily  . metoprolol  5 mg Intravenous Q6H  . nystatin  5 mL Oral QID   Continuous Infusions: . sodium chloride 75 mL/hr at 01/01/17 0918     LOS: 0 days    Time spent:    Zannie Cove,  MD [facility] Pager [phone]  If 7PM-7AM, please contact night-coverage [email or web address] Password [email or web address] 01/01/2017, 11:54 AM

## 2017-01-01 NOTE — Progress Notes (Signed)
Nutrition Brief Note  Pt identified on the </= 12 braden score report. Currently NPO.  SLP note reviewed.  Aspiration risk severe. Palliative Care Team consulted for goals of care. No nutrition interventions warranted at this time.  Please consult as needed.   Maureen ChattersKatie Lowell Mcgurk, RD, LDN Pager #: [phone] After-Hours Pager #: [phone]

## 2017-01-02 NOTE — Progress Notes (Signed)
Report called to [facility].  

## 2017-01-02 NOTE — Clinical Social Work Note (Addendum)
[facility] Pt's dtr-Audrey completed paperwork with Stacie ([facility]) and was at bedside. [facility] [phone]) and confirmed receipt. CSW arranged transportation with [facility]. RN aware and provided number for report. CSW informed dtr (at bedside) of transport. CSW signing off as no further SW needs identified.   Corlis HoveJeneya Brier Firebaugh, LCSWA, LCASA Clinical Social Work [phone]

## 2017-01-02 NOTE — Progress Notes (Signed)
[facility] Liaison: ? Received a request from [place] for family interest in [facility], with request to transfer today. Chart reviewed and appreciate report from PMT. Met with daugther, Luellen Pucker to confirm interest and explain services. Family agreeable to transfer today. CSW, [place] aware and transportation being scheduled at this time. Registration paperwork completed. ? Please fax discharge summary to [phone]. ? RN please call report to [phone]. ? Thank You, Freddi Starr RN, [facility] Liaison [phone]

## 2017-01-02 NOTE — Discharge Summary (Signed)
Physician Discharge Summary  Bryce Potter ZOX:[id number] DOB: 1919-03-08 DOA: 12/31/2016  PCP: Boneta Lucks, NP  Admit date: 12/31/2016 Discharge date: 01/02/2017  Time spent: 35 minutes  Recommendations for Outpatient Follow-up:  1. Residential Hospice   Discharge Diagnoses:  Principal Problem:   Hypernatremia Active Problems:   Weakness generalized   CKD (chronic kidney disease), stage III   Hypothyroidism   Dehydration   Failure to thrive in adult   Ischemic stroke (HCC)   Abdominal pain   Oropharyngeal dysphagia   Palliative care by specialist   Failure to thrive (0-17)   Discharge Condition: poor  Diet recommendation: comfort feeds  Filed Weights   12/31/16 1559  Weight: 84.5 kg (186 lb 4.6 oz)    History of present illness:  Bryce Potter a 83 y.o.malewith medical history significant for recent stroke, chronic kidney disease, dehydration, Escherichia coli bacteremia, hypothyroidism presents to the emergency department from the facility with the chief complaint of generalized weakness decreased oral intake. Patient was discharged 16 days ago after being hospitalized for 3 days with a lacunar CVA and UTI. . Does report left side is "gone". He continues to have difficulty swallowing. Over the last 2 days he has not taken anything by mouth. He also reports a generalized weakness. He's been nonambulatory since the stroke  Hospital Course:  #1. Hypernatremia/dehydration - dehydration secondary to decreased oral intake, dysphagia since his recent stroke. -Na still high, s/p palliative meeting, plan for comfort care and residential Hospice  #2. Adult failure to thrive since recent CVA -Palliative care consulted, Hospice/comfort care planned  #3. Stage 3 sacral decub -wound care, turn q2 hours  #4. Chronic kidney disease. Creatinine 1.9. This is fairly close to her baseline. -comfort care, stopped labs  #5. Recent stroke. Lacunar infarct 12/12/16. Discharged  with plavix. Left hemiparesis and dysphagia. Dysphagia 2 diet with nectar thick liquids recommended at discharge -with ongoing decline, now on comfort feeds  Consultations: Palliative medicine  Discharge Exam: Vitals:   01/02/17 0054 01/02/17 0539  BP: (!) 126/52 (!) 107/38  Pulse: 75 76  Resp:  18  Temp:  98.3 F (36.8 C)    General: awake, confused,  No distress Cardiovascular: S1S2/RRR Respiratory: ronchi and conducted upper airway sounds  Discharge Instructions   Discharge Instructions    Discharge instructions    Complete by:  As directed    Comfort Feeds   Increase activity slowly    Complete by:  As directed      Current Discharge Medication List    CONTINUE these medications which have NOT CHANGED   Details  albuterol (PROVENTIL) (2.5 MG/3ML) 0.083% nebulizer solution Take 2.5 mg by nebulization every 4 (four) hours as needed for wheezing or shortness of breath.    clopidogrel (PLAVIX) 75 MG tablet Take 1 tablet (75 mg total) by mouth daily.    dextromethorphan-guaiFENesin (MUCINEX DM) 30-600 MG 12hr tablet Take 1 tablet by mouth 2 (two) times daily.     levothyroxine (SYNTHROID, LEVOTHROID) 75 MCG tablet Take 75 mcg by mouth daily before breakfast.     metoprolol tartrate (LOPRESSOR) 25 MG tablet Take 25 mg by mouth 2 (two) times daily.    Nutritional Supplements (NUTRITIONAL SUPPLEMENT PO) Take 1 each by mouth 2 (two) times daily. Magic Cup    omeprazole (PRILOSEC) 40 MG capsule Take 40 mg by mouth daily.     tamsulosin (FLOMAX) 0.4 MG CAPS capsule Take 0.4 mg by mouth daily after supper.     acetaminophen (TYLENOL) 325  MG tablet Take 650 mg by mouth every 6 (six) hours as needed for mild pain.       STOP taking these medications     atorvastatin (LIPITOR) 20 MG tablet      cefTRIAXone (ROCEPHIN) 1 g injection      nystatin (MYCOSTATIN) 100000 UNIT/ML suspension      omega-3 acid ethyl esters (LOVAZA) 1 G capsule      saccharomyces boulardii  (FLORASTOR) 250 MG capsule      UNABLE TO FIND        No Known Allergies Follow-up Information    Boneta Lucks, NP Follow up.   Specialty:  Nurse Practitioner Contact information: [phone] [address] [place] [address] [phone]            The results of significant diagnostics from this hospitalization (including imaging, microbiology, ancillary and laboratory) are listed below for reference.    Significant Diagnostic Studies: Dg Chest 1 View  Result Date: 12/31/2016 CLINICAL DATA:  Abdominal pain.  Failure to thrive EXAM: CHEST 1 VIEW COMPARISON:  12/11/2016 FINDINGS: Chronic mild cardiomegaly. Aortic tortuosity, stable from 12/10/2016. There is no edema, consolidation, effusion, or pneumothorax. Spondylosis. No acute osseous finding. IMPRESSION: No acute finding. Mild cardiomegaly. Electronically Signed   By: Marnee Spring M.D.   On: 12/31/2016 11:08   Dg Chest 2 View  Result Date: 12/10/2016 CLINICAL DATA:  Altered mental status, history of stroke EXAM: CHEST  2 VIEW COMPARISON:  07/06/2014 FINDINGS: Borderline cardiomegaly noted. No pulmonary edema. There is left basilar atelectasis or infiltrate. Degenerative changes thoracic spine again noted. IMPRESSION: No pulmonary edema. Left basilar atelectasis on infiltrate. Degenerative changes thoracic spine. Electronically Signed   By: Natasha Mead M.D.   On: 12/10/2016 12:05   Ct Head Wo Contrast  Result Date: 12/10/2016 CLINICAL DATA:  Altered mental status, increasing lethargy and weakness EXAM: CT HEAD WITHOUT CONTRAST TECHNIQUE: Contiguous axial images were obtained from the base of the skull through the vertex without intravenous contrast. COMPARISON:  CT brain scan of 08/25/2013 FINDINGS: Brain: Ventriculomegaly is unchanged as is diffuse cortical atrophy. The septum is midline in position. Moderately severe small vessel ischemic change throughout the periventricular white matter is unchanged as well. No hemorrhage, mass  lesion, or acute infarction is seen. Vascular: No vascular abnormality is seen on this unenhanced study. Skull: No calvarial abnormality is seen. Sinuses/Orbits: There is mild mucosal thickening within the maxillary sinuses but no present sinusitis is seen. Other: None. IMPRESSION: 1. Stable atrophy and significant small vessel ischemic change throughout the periventricular white matter. No acute intracranial abnormality. 2. Mild mucosal thickening in the maxillary sinuses. Electronically Signed   By: Dwyane Dee M.D.   On: 12/10/2016 11:53   Mr Maxine Glenn Head Wo Contrast  Result Date: 12/13/2016 CLINICAL DATA:  83 year old male with altered mental status. Possible early left MCA territory infarct on noncontrast head CT today. EXAM: MRI HEAD WITHOUT CONTRAST MRA HEAD WITHOUT CONTRAST TECHNIQUE: Multiplanar, multiecho pulse sequences of the brain and surrounding structures were obtained without intravenous contrast. Angiographic images of the head were obtained using MRA technique without contrast. COMPARISON:  Head CT without contrast 1409 hours. FINDINGS: MRI HEAD FINDINGS The examination had to be discontinued prior to completion due to patient coughing, confusion, and agitation. Axial and coronal diffusion weighted imaging plus axial T2, FLAIR, and T2* imaging was obtained and is intermittently degraded by motion artifact. Several Subcentimeter foci of restricted diffusion in the right posterior corona radiata and right lentiform nuclei (  series 4 images 27-31. No other convincing restricted diffusion identified. Mild associated T2 and FLAIR hyperintensity with the acute diffusion findings. Superimposed chronic lacunar infarcts in the right basal ganglia and thalamus. Superimposed confluent bilateral cerebral white matter T2 and FLAIR hyperintensity with scattered areas of chronic white matter lacunar infarcts (series 6, image 19). No definite chronic cerebral blood products. No midline shift. No extra-axial  collection or acute intracranial hemorrhage identified. Stable ventricle size and configuration. Patent basilar cisterns. MRA HEAD FINDINGS Study is intermittently degraded by motion artifact despite repeated imaging attempts. Antegrade flow in the distal left vertebral artery, but no antegrade flow signal in the distal right vertebral artery. Antegrade flow in the basilar without stenosis. PCA origins, P1 and P2 segments bilaterally are patent. Posterior communicating arteries are diminutive or absent. Antegrade flow in both ICA siphons. Bilateral carotid termini are patent. Both MCA and ACA origins appear patent. But insufficient bilateral MCA and ACA branch detail otherwise. IMPRESSION: 1. Prematurely discontinued and intermittently motion degraded study due to patient agitation. 2. Scattered small acute lacunar infarcts in the right corona radiata and posterior right deep gray matter nuclei. No associated hemorrhage or mass effect. 3. No strong evidence of emergent large vessel occlusion. 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 4. Underlying chronic small vessel disease in the bilateral cerebral white matter and right deep gray matter nuclei. Electronically Signed   By: Odessa Fleming M.D.   On: 12/13/2016 14:56   Mr Brain Wo Contrast  Result Date: 12/13/2016 CLINICAL DATA:  83 year old male with altered mental status. Possible early left MCA territory infarct on noncontrast head CT today. EXAM: MRI HEAD WITHOUT CONTRAST MRA HEAD WITHOUT CONTRAST TECHNIQUE: Multiplanar, multiecho pulse sequences of the brain and surrounding structures were obtained without intravenous contrast. Angiographic images of the head were obtained using MRA technique without contrast. COMPARISON:  Head CT without contrast 1409 hours. FINDINGS: MRI HEAD FINDINGS The examination had to be discontinued prior to completion due to patient coughing, confusion, and agitation. Axial and coronal diffusion weighted imaging  plus axial T2, FLAIR, and T2* imaging was obtained and is intermittently degraded by motion artifact. Several Subcentimeter foci of restricted diffusion in the right posterior corona radiata and right lentiform nuclei (series 4 images 27-31. No other convincing restricted diffusion identified. Mild associated T2 and FLAIR hyperintensity with the acute diffusion findings. Superimposed chronic lacunar infarcts in the right basal ganglia and thalamus. Superimposed confluent bilateral cerebral white matter T2 and FLAIR hyperintensity with scattered areas of chronic white matter lacunar infarcts (series 6, image 19). No definite chronic cerebral blood products. No midline shift. No extra-axial collection or acute intracranial hemorrhage identified. Stable ventricle size and configuration. Patent basilar cisterns. MRA HEAD FINDINGS Study is intermittently degraded by motion artifact despite repeated imaging attempts. Antegrade flow in the distal left vertebral artery, but no antegrade flow signal in the distal right vertebral artery. Antegrade flow in the basilar without stenosis. PCA origins, P1 and P2 segments bilaterally are patent. Posterior communicating arteries are diminutive or absent. Antegrade flow in both ICA siphons. Bilateral carotid termini are patent. Both MCA and ACA origins appear patent. But insufficient bilateral MCA and ACA branch detail otherwise. IMPRESSION: 1. Prematurely discontinued and intermittently motion degraded study due to patient agitation. 2. Scattered small acute lacunar infarcts in the right corona radiata and posterior right deep gray matter nuclei. No associated hemorrhage or mass effect. 3. No strong evidence of emergent large vessel occlusion. Suspect chronic occlusion of the distal  right vertebral artery. 4. Underlying chronic small vessel disease in the bilateral cerebral white matter and right deep gray matter nuclei. Electronically Signed   By: Odessa Fleming M.D.   On: 12/13/2016  14:56   Ct Abdomen Pelvis W Contrast  Result Date: 12/31/2016 CLINICAL DATA:  Right-sided abdominal pain. EXAM: CT ABDOMEN AND PELVIS WITH CONTRAST TECHNIQUE: Multidetector CT imaging of the abdomen and pelvis was performed using the standard protocol following bolus administration of intravenous contrast. CONTRAST:  ISOVUE-300 IOPAMIDOL (ISOVUE-300) INJECTION 61% COMPARISON:  CT scan of November 09, 2008. FINDINGS: Lower chest: Minimal right posterior basilar subsegmental atelectasis is noted. Hepatobiliary: No focal liver abnormality is seen. No gallstones, gallbladder wall thickening, or biliary dilatation. Pancreas: Unremarkable. No pancreatic ductal dilatation or surrounding inflammatory changes. Spleen: Normal in size without focal abnormality. Adrenals/Urinary Tract: Adrenal glands are unremarkable. Bilateral renal cysts are noted. No hydronephrosis or renal obstruction is noted. No renal or ureteral calculi are noted. Urinary bladder is unremarkable. Stomach/Bowel: Stomach is within normal limits. Appendix appears normal. No evidence of bowel wall thickening, distention, or inflammatory changes. Sigmoid diverticulosis is noted. Vascular/Lymphatic: Aortic atherosclerosis. No enlarged abdominal or pelvic lymph nodes. Reproductive: Prostate is unremarkable. Other: No abdominal wall hernia or abnormality. No abdominopelvic ascites. Musculoskeletal: No acute or significant osseous findings. IMPRESSION: Aortic atherosclerosis. Bilateral renal cysts. No acute abnormality seen in the abdomen or pelvis. Electronically Signed   By: Lupita Raider, M.D.   On: 12/31/2016 13:24   Dg Chest Port 1 View  Result Date: 12/11/2016 CLINICAL DATA:  Acute onset of cough.  Initial encounter. EXAM: PORTABLE CHEST 1 VIEW COMPARISON:  Chest radiograph performed 12/10/2016 FINDINGS: The lungs are well-aerated and clear. There is no evidence of focal opacification, pleural effusion or pneumothorax. The cardiomediastinal  silhouette is enlarged. No acute osseous abnormalities are seen. IMPRESSION: Cardiomegaly.  Lungs remain grossly clear. Electronically Signed   By: Roanna Raider M.D.   On: 12/11/2016 23:43   Dg Swallowing Func-speech Pathology  Result Date: 12/14/2016 Objective Swallowing Evaluation: Type of Study: MBS-Modified Barium Swallow Study Patient Details Name: Chrishaun Sasso MRN: [id number] Date of Birth: 1919/01/21 Today's Date: 12/14/2016 Time: SLP Start Time (ACUTE ONLY): 1329-SLP Stop Time (ACUTE ONLY): 1343 SLP Time Calculation (min) (ACUTE ONLY): 14 min Past Medical History: Past Medical History: Diagnosis Date . Arthritis  . Dementia  . GERD (gastroesophageal reflux disease)  . Hypertension  . Hypothyroidism  . Stroke [facility]) in age 61's - 66's . UTI (urinary tract infection) 12/10/2016 Past Surgical History: Past Surgical History: Procedure Laterality Date . CATARACT EXTRACTION Bilateral  HPI: Abyan Cadman a 83 y.o.malewith medical history significant forarthritis, dementia, [name], hypertension, hypothyroidism,stroke, brought to the emergency department by his daughter, due to 1 to 2 day history of increased debilitation, and acute confusion. Chest x ray grossly clear. MRI showed scattered small acute lacunar infarcts in the right corona radiata and posterior right deep gray matter nuclei. No Data Recorded Assessment / Plan / Recommendation CHL IP CLINICAL IMPRESSIONS 12/14/2016 Clinical Impression Mr. Oyama presents with a moderate oral and moderate sensorimotor pharyngeal dysphagia. Pt required assistance for feeding as well as verbal cueing and reminders throughout the MBS to "swallow". Oral phase impairments > pharyngeal impairments consisting of delayed oral transit/holding, lingual/palatal residue, and poor labial closure resulting in anterior spillage. Pt reported that he has partial dentures in his room, although they were unavailable during this study. Observed moderate vallecular residue with each  tested consistency that was ineffectively cleared with cues  for second swallows. Cup sips of thin liquids resulted in silent penetration to the level of the vocal cords with a weak, cued cough that did not clear penetrates. Compensatory strategies or postural changes were not attempted due to pt's hx dementia and unlikely compliance/carryover. Educated pt and daughter re: results of MBS and diet recomendations. Recommend Dys 2 solids, nectar thick liquids (no straws), meds crushed, lingual sweep for clearance of pocketing. ST will f/u briefly for continued education and for diet tolerance. SLP Visit Diagnosis Dysphagia, oropharyngeal phase (R13.12) Attention and concentration deficit following -- Frontal lobe and executive function deficit following -- Impact on safety and function Moderate aspiration risk   CHL IP TREATMENT RECOMMENDATION 12/14/2016 Treatment Recommendations Therapy as outlined in treatment plan below   Prognosis 12/14/2016 Prognosis for Safe Diet Advancement Fair Barriers to Reach Goals Cognitive deficits Barriers/Prognosis Comment -- CHL IP DIET RECOMMENDATION 12/14/2016 SLP Diet Recommendations Dysphagia 2 (Fine chop) solids;Nectar thick liquid Liquid Administration via Cup;No straw Medication Administration Crushed with puree Compensations Slow rate;Small sips/bites;Minimize environmental distractions;Monitor for anterior loss;Lingual sweep for clearance of pocketing Postural Changes Seated upright at 90 degrees   CHL IP OTHER RECOMMENDATIONS 12/14/2016 Recommended Consults -- Oral Care Recommendations Oral care BID Other Recommendations Order thickener from pharmacy   CHL IP FOLLOW UP RECOMMENDATIONS 12/14/2016 Follow up Recommendations Skilled Nursing facility   [facility] IP FREQUENCY AND DURATION 12/14/2016 Speech Therapy Frequency (ACUTE ONLY) min 2x/week Treatment Duration 1 week      CHL IP ORAL PHASE 12/14/2016 Oral Phase Impaired Oral - Pudding Teaspoon -- Oral - Pudding Cup -- Oral - Honey Teaspoon  -- Oral - Honey Cup -- Oral - Nectar Teaspoon -- Oral - Nectar Cup Delayed oral transit;Piecemeal swallowing;Lingual/palatal residue;Left anterior bolus loss;Right anterior bolus loss;Holding of bolus Oral - Nectar Straw -- Oral - Thin Teaspoon -- Oral - Thin Cup Holding of bolus;Lingual/palatal residue;Piecemeal swallowing;Delayed oral transit;Left anterior bolus loss;Right anterior bolus loss Oral - Thin Straw -- Oral - Puree -- Oral - Mech Soft Delayed oral transit;Impaired mastication;Other (Comment) Oral - Regular -- Oral - Multi-Consistency -- Oral - Pill -- Oral Phase - Comment --  CHL IP PHARYNGEAL PHASE 12/14/2016 Pharyngeal Phase Impaired Pharyngeal- Pudding Teaspoon -- Pharyngeal -- Pharyngeal- Pudding Cup -- Pharyngeal -- Pharyngeal- Honey Teaspoon -- Pharyngeal -- Pharyngeal- Honey Cup -- Pharyngeal -- Pharyngeal- Nectar Teaspoon -- Pharyngeal -- Pharyngeal- Nectar Cup Pharyngeal residue - valleculae;Reduced epiglottic inversion;Delayed swallow initiation-vallecula;Delayed swallow initiation-pyriform sinuses Pharyngeal -- Pharyngeal- Nectar Straw -- Pharyngeal -- Pharyngeal- Thin Teaspoon -- Pharyngeal -- Pharyngeal- Thin Cup Penetration/Aspiration during swallow;Pharyngeal residue - valleculae;Delayed swallow initiation-vallecula;Delayed swallow initiation-pyriform sinuses;Reduced epiglottic inversion Pharyngeal Material enters airway, CONTACTS cords and not ejected out Pharyngeal- Thin Straw -- Pharyngeal -- Pharyngeal- Puree -- Pharyngeal -- Pharyngeal- Mechanical Soft WFL Pharyngeal -- Pharyngeal- Regular -- Pharyngeal -- Pharyngeal- Multi-consistency -- Pharyngeal -- Pharyngeal- Pill -- Pharyngeal -- Pharyngeal Comment --  CHL IP CERVICAL ESOPHAGEAL PHASE 12/14/2016 Cervical Esophageal Phase WFL Pudding Teaspoon -- Pudding Cup -- Honey Teaspoon -- Honey Cup -- Nectar Teaspoon -- Nectar Cup -- Nectar Straw -- Thin Teaspoon -- Thin Cup -- Thin Straw -- Puree -- Mechanical Soft -- Regular --  Multi-consistency -- Pill -- Cervical Esophageal Comment -- No flowsheet data found. Royce Macadamia 12/14/2016, 2:59 PM Breck Coons Lonell Face.Ed CCC-SLP Pager [phone]              Ct Head Code Stroke Wo Contrast`  Result Date: 12/12/2016 CLINICAL DATA:  Code stroke. Altered mental status.  Right-sided facial droop and drooling. The patient is leaning to the right. EXAM: CT HEAD WITHOUT CONTRAST TECHNIQUE: Contiguous axial images were obtained from the base of the skull through the vertex without intravenous contrast. COMPARISON:  CT head without contrast 12/10/2016 and 08/25/2013. FINDINGS: Brain: Passed atrophy diffuse white matter disease is again seen. There may be subtle loss of gray-white differentiation along the left insular cortex compared to the prior studies. The basal ganglia are intact. Remote lacunar infarcts of the basal ganglia are stable. No acute hemorrhage is present. The ventricles are proportionate to the degree of atrophy and stable. No significant extra-axial fluid collection is present. Vascular: The left M1 segment is slightly hyperdense compared to the other vessels. This could represent thrombus. Atherosclerotic calcifications are again noted within the cavernous segments bilaterally. Skull: Calvarium is intact. No focal lytic or blastic lesions are present. Sinuses/Orbits: The paranasal sinuses and mastoid air cells are clear. Bilateral lens replacements are present. The globes and orbits are otherwise intact. ASPECTS [facility] Stroke Program Early CT Score) - Ganglionic level infarction (caudate, lentiform nuclei, internal capsule, insula, M1-M3 cortex): 6/7 - Supraganglionic infarction (M4-M6 cortex): 3/3 Total score (0-10 with 10 being normal): 9/10 IMPRESSION: 1. Possible developing nonhemorrhagic left MCA territory infarct with subtle change in gray-white differentiation along the insular cortex and asymmetric hyperdense left M1 segments suggesting possible large vessel  occlusion. CTA of the head and neck would be useful. CT perfusion could be utilized for evaluation of cerebral ischemia if indicated. 2. ASPECTS is 9/10 3. Advanced atrophy and diffuse white matter disease is otherwise stable. 4. Remote lacunar infarcts are noted within the basal ganglia bilaterally. These results were called by telephone at the time of interpretation on 12/12/2016 at 2:31 pm to Dr. Nicholas Lose, who verbally acknowledged these results. Electronically Signed   By: Marin Roberts M.D.   On: 12/12/2016 14:32    Microbiology: Recent Results (from the past 240 hour(s))  Blood culture (routine x 2)     Status: None (Preliminary result)   Collection Time: 12/31/16 10:25 AM  Result Value Ref Range Status   Specimen Description BLOOD RIGHT ANTECUBITAL  Final   Special Requests   Final    BOTTLES DRAWN AEROBIC AND ANAEROBIC Blood Culture adequate volume   Culture NO GROWTH 1 DAY  Final   Report Status PENDING  Incomplete  Blood culture (routine x 2)     Status: None (Preliminary result)   Collection Time: 12/31/16 10:40 AM  Result Value Ref Range Status   Specimen Description BLOOD LEFT HAND  Final   Special Requests   Final    BOTTLES DRAWN AEROBIC AND ANAEROBIC Blood Culture adequate volume   Culture NO GROWTH 1 DAY  Final   Report Status PENDING  Incomplete  MRSA PCR Screening     Status: None   Collection Time: 12/31/16  5:18 PM  Result Value Ref Range Status   MRSA by PCR NEGATIVE NEGATIVE Final    Comment:        The GeneXpert MRSA Assay (FDA approved for NASAL specimens only), is one component of a comprehensive MRSA colonization surveillance program. It is not intended to diagnose MRSA infection nor to guide or monitor treatment for MRSA infections.      Labs: Basic Metabolic Panel:  Recent Labs Lab 12/31/16 1030 12/31/16 1647 01/01/17 0410  NA 152*  --  153*  K 4.9  --  4.4  CL 120*  --  123*  CO2 21*  --  20*  GLUCOSE 136*  --  103*  BUN 52*  --   41*  CREATININE 1.90* 1.65* 1.43*  CALCIUM 8.6*  --  8.0*   Liver Function Tests:  Recent Labs Lab 12/31/16 1030  AST 80*  ALT 135*  ALKPHOS 83  BILITOT 0.7  PROT 7.7  ALBUMIN 2.3*    Recent Labs Lab 12/31/16 1030  LIPASE 44   No results for input(s): AMMONIA in the last 168 hours. CBC:  Recent Labs Lab 12/31/16 1030 01/01/17 0410  WBC 14.4* 12.6*  NEUTROABS 11.1*  --   HGB 11.7* 10.3*  HCT 38.5* 35.0*  MCV 96.3 97.0  PLT PLATELET CLUMPS NOTED ON SMEAR, UNABLE TO ESTIMATE 157   Cardiac Enzymes: No results for input(s): CKTOTAL, CKMB, CKMBINDEX, TROPONINI in the last 168 hours. BNP: BNP (last 3 results) No results for input(s): BNP in the last 8760 hours.  ProBNP (last 3 results) No results for input(s): PROBNP in the last 8760 hours.  CBG: No results for input(s): GLUCAP in the last 168 hours.     SignedZannie Cove:  Tyia Binford MD.  [facility] 01/02/2017, 10:13 AM

## 2017-01-05 LAB — CULTURE, BLOOD (ROUTINE X 2)
Culture: NO GROWTH
Culture: NO GROWTH
SPECIAL REQUESTS: ADEQUATE
Special Requests: ADEQUATE

## 2017-01-29 DEATH — deceased

## 2018-05-16 IMAGING — CT CT HEAD W/O CM
3 of 4 series · 16 of 47 positions shown, 19 images · non-contrast
Comparison: CT brain scan of 08/25/2013

CLINICAL DATA: Altered mental status, increasing lethargy and
weakness

EXAM:
CT HEAD WITHOUT CONTRAST
TECHNIQUE: Contiguous axial images were obtained from the base of the skull
through the vertex without intravenous contrast.

[Series 201: head w/o, idose (1) · axial · non-contrast · 0.49mm/px · z∈[+87,+217]mm · 10 of 32 slices shown, 13 images]
[im 3/32  brain]
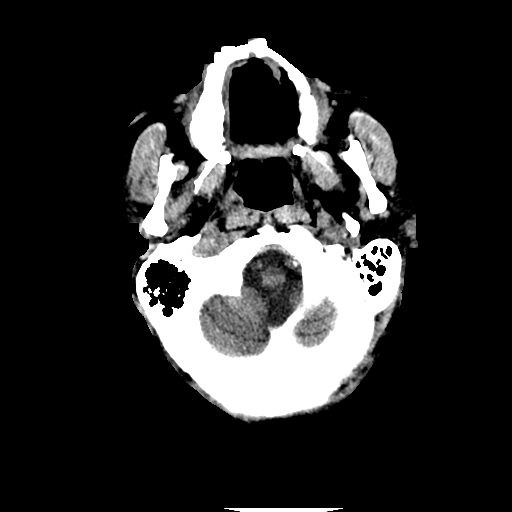
[im 3/32  bone]
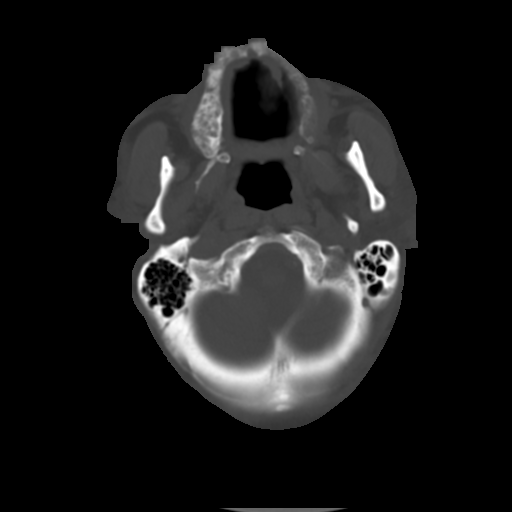
[im 5/32  brain]
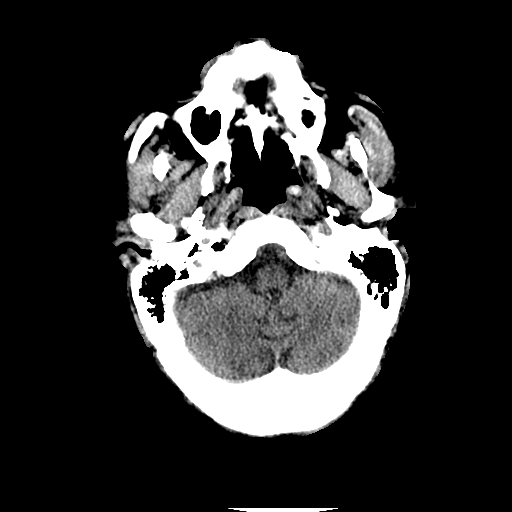
[im 9/32  brain]
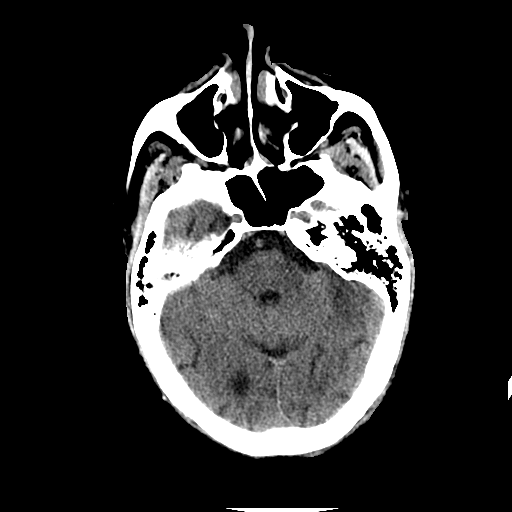
[im 12/32  brain]
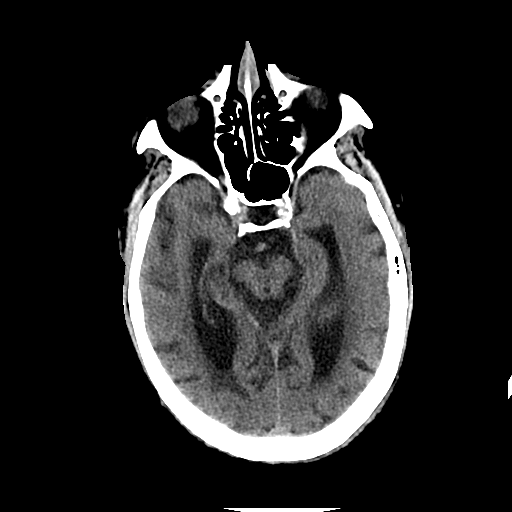
[im 14/32  brain]
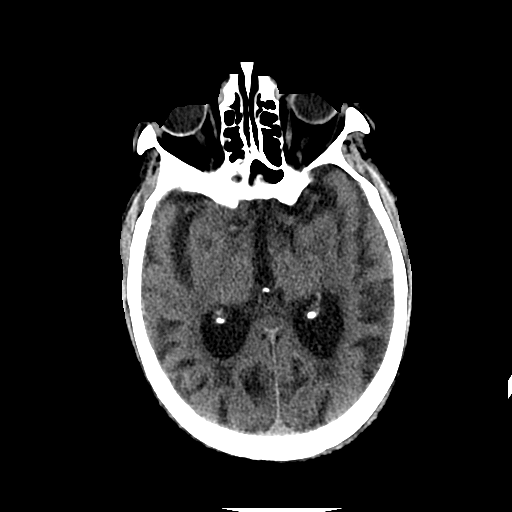
[im 14/32  bone]
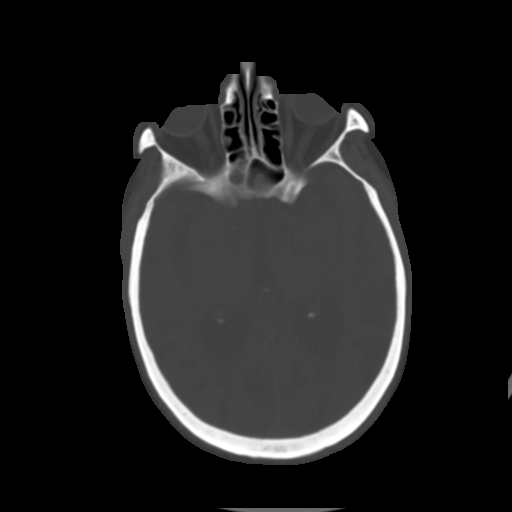
[im 18/32  brain]
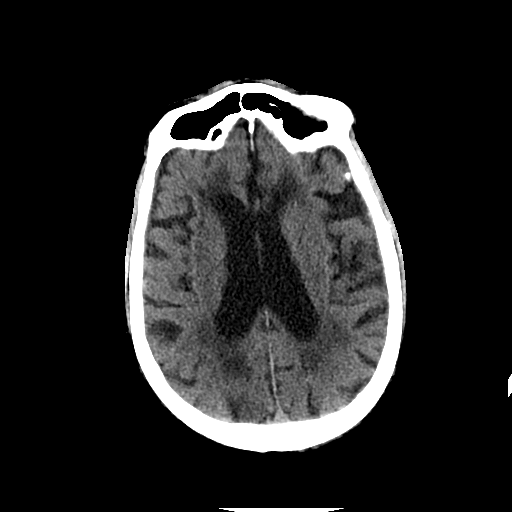
[im 20/32  brain]
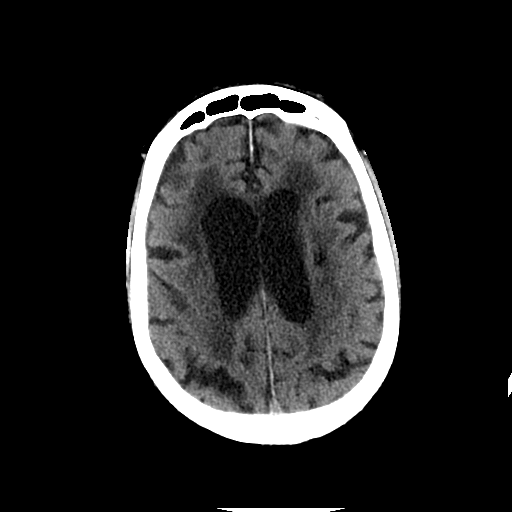
[im 23/32  brain]
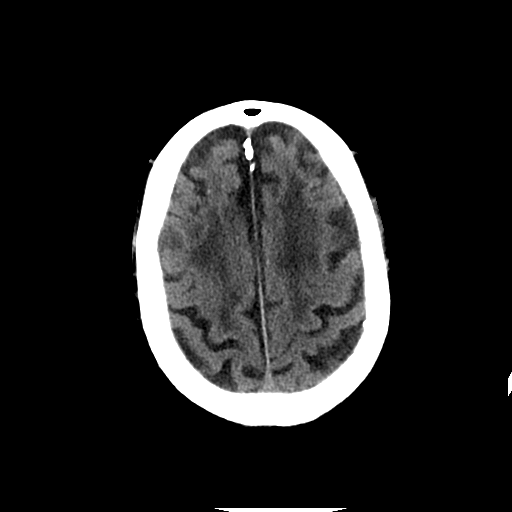
[im 27/32  brain]
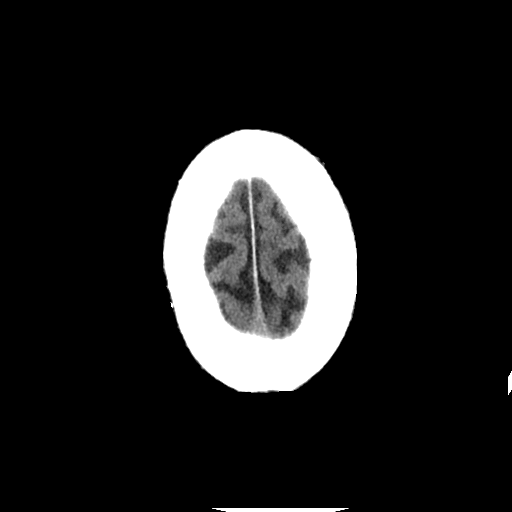
[im 27/32  bone]
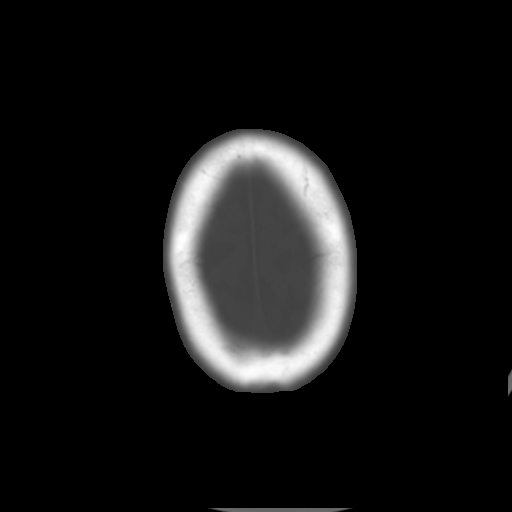
[im 29/32  brain]
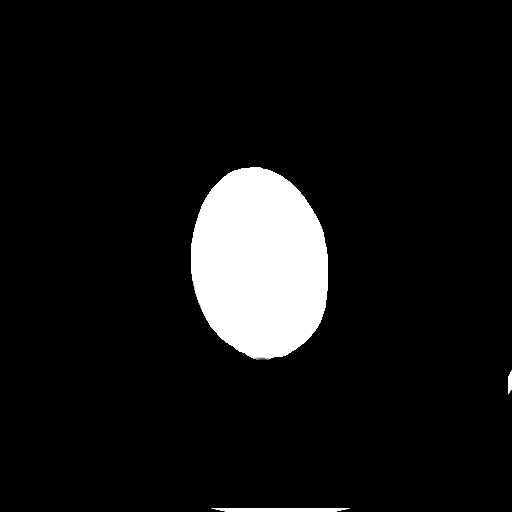

[Series 205: coronal st, idose (1) · coronal · 0.40mm/px · 3 of 76 slices shown]
[im 26/76  brain]
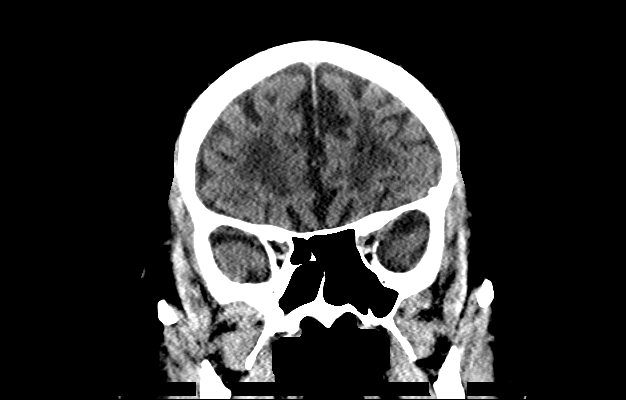
[im 34/76  brain]
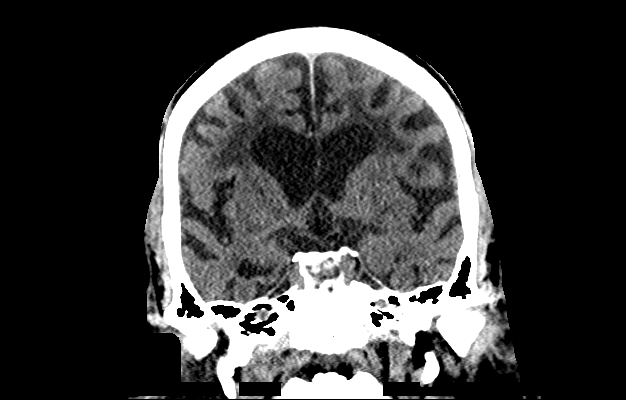
[im 42/76  brain]
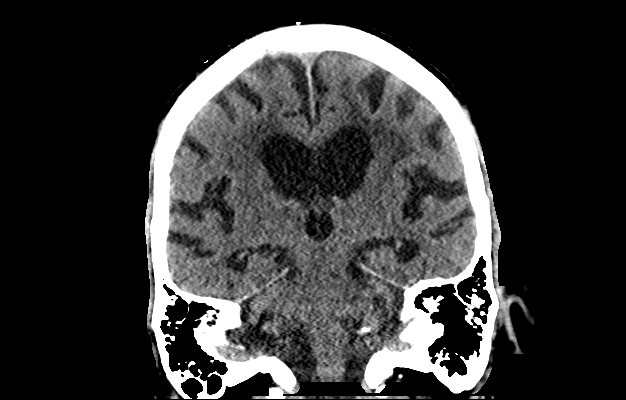

[Series 206: sagittal st, idose (1) · sagittal · 0.40mm/px · 3 of 57 slices shown]
[im 19/57  brain]
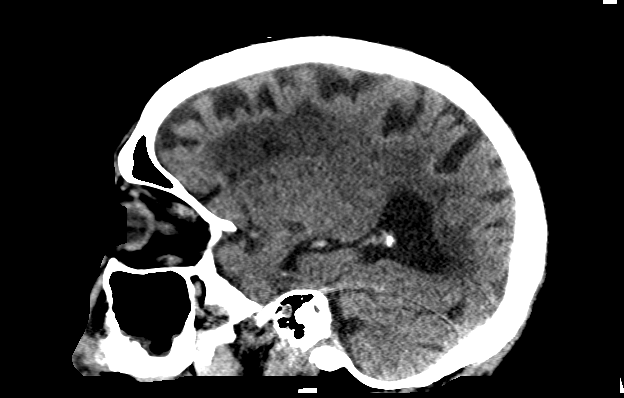
[im 29/57  brain]
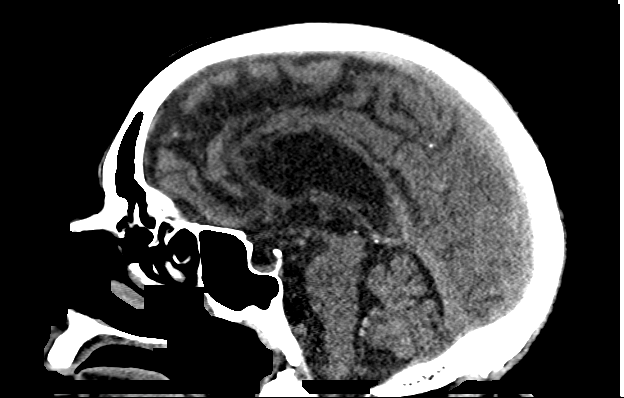
[im 38/57  brain]
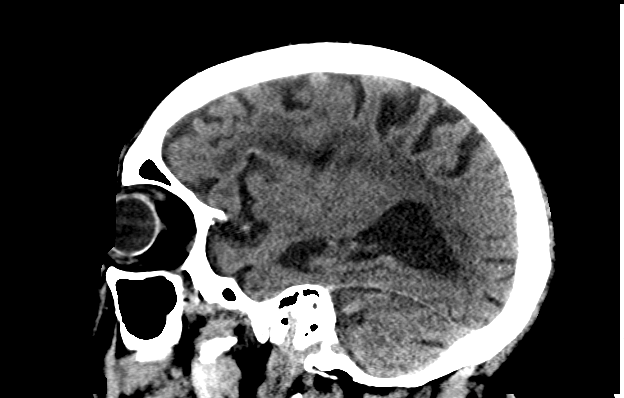

[16 of 47 positions shown; findings below may reference images not displayed]

FINDINGS: Brain: Ventriculomegaly is unchanged as is diffuse cortical atrophy.
The septum is midline in position. Moderately severe small vessel
ischemic change throughout the periventricular white matter is
unchanged as well. No hemorrhage, mass lesion, or acute infarction
is seen.

Vascular: No vascular abnormality is seen on this unenhanced study.

Skull: No calvarial abnormality is seen.

Sinuses/Orbits: There is mild mucosal thickening within the
maxillary sinuses but no present sinusitis is seen.

Other: None.
IMPRESSION: 1. Stable atrophy and significant small vessel ischemic change
throughout the periventricular white matter. No acute intracranial
abnormality.
2. Mild mucosal thickening in the maxillary sinuses.
